# Patient Record
Sex: Male | Born: 1960
Health system: Southern US, Community
[De-identification: ages and names within clinical notes are randomized; demographics above are authoritative.]

## PROBLEM LIST (undated history)

## (undated) DIAGNOSIS — R635 Abnormal weight gain: Secondary | ICD-10-CM

## (undated) DIAGNOSIS — R413 Other amnesia: Secondary | ICD-10-CM

## (undated) DIAGNOSIS — F4323 Adjustment disorder with mixed anxiety and depressed mood: Secondary | ICD-10-CM

## (undated) DIAGNOSIS — G4700 Insomnia, unspecified: Secondary | ICD-10-CM

## (undated) DIAGNOSIS — R42 Dizziness and giddiness: Secondary | ICD-10-CM

## (undated) DIAGNOSIS — F4321 Adjustment disorder with depressed mood: Principal | ICD-10-CM

## (undated) DIAGNOSIS — B20 Human immunodeficiency virus [HIV] disease: Secondary | ICD-10-CM

## (undated) DIAGNOSIS — Z21 Asymptomatic human immunodeficiency virus [HIV] infection status: Secondary | ICD-10-CM

## (undated) DIAGNOSIS — R110 Nausea: Secondary | ICD-10-CM

## (undated) DIAGNOSIS — C61 Malignant neoplasm of prostate: Secondary | ICD-10-CM

## (undated) HISTORY — DX: Asymptomatic human immunodeficiency virus (hiv) infection status: Z21

## (undated) HISTORY — DX: Insomnia, unspecified: G47.00

## (undated) HISTORY — DX: Malignant neoplasm of prostate: C61

## (undated) HISTORY — DX: Nausea: R11.0

## (undated) HISTORY — DX: Adjustment disorder with mixed anxiety and depressed mood: F43.23

## (undated) HISTORY — DX: Abnormal weight gain: R63.5

## (undated) HISTORY — DX: Other amnesia: R41.3

## (undated) HISTORY — DX: Dizziness and giddiness: R42

## (undated) HISTORY — DX: Human immunodeficiency virus (HIV) disease: B20

## (undated) HISTORY — DX: Adjustment disorder with depressed mood: F43.21

---

## 2001-09-20 ENCOUNTER — Emergency Department (HOSPITAL_COMMUNITY): Admission: EM | Admit: 2001-09-20 | Discharge: 2001-09-20 | Payer: Self-pay | Admitting: Emergency Medicine

## 2001-09-24 ENCOUNTER — Encounter: Payer: Self-pay | Admitting: Emergency Medicine

## 2001-09-24 ENCOUNTER — Emergency Department (HOSPITAL_COMMUNITY): Admission: EM | Admit: 2001-09-24 | Discharge: 2001-09-24 | Payer: Self-pay | Admitting: Emergency Medicine

## 2001-09-26 ENCOUNTER — Encounter: Payer: Self-pay | Admitting: Internal Medicine

## 2001-09-26 ENCOUNTER — Inpatient Hospital Stay (HOSPITAL_COMMUNITY): Admission: EM | Admit: 2001-09-26 | Discharge: 2001-10-07 | Payer: Self-pay | Admitting: Emergency Medicine

## 2001-09-26 ENCOUNTER — Encounter: Payer: Self-pay | Admitting: Emergency Medicine

## 2001-09-26 ENCOUNTER — Encounter (INDEPENDENT_AMBULATORY_CARE_PROVIDER_SITE_OTHER): Payer: Self-pay | Admitting: Specialist

## 2001-09-27 ENCOUNTER — Encounter (INDEPENDENT_AMBULATORY_CARE_PROVIDER_SITE_OTHER): Payer: Self-pay | Admitting: *Deleted

## 2001-09-27 ENCOUNTER — Encounter: Payer: Self-pay | Admitting: Infectious Disease

## 2001-09-27 LAB — CONVERTED CEMR LAB: CD4 Count: 10 microliters

## 2001-09-28 ENCOUNTER — Encounter: Payer: Self-pay | Admitting: Internal Medicine

## 2001-10-22 ENCOUNTER — Encounter: Admission: RE | Admit: 2001-10-22 | Discharge: 2001-10-22 | Payer: Self-pay | Admitting: Infectious Diseases

## 2001-12-06 ENCOUNTER — Ambulatory Visit (HOSPITAL_COMMUNITY): Admission: RE | Admit: 2001-12-06 | Discharge: 2001-12-06 | Payer: Self-pay | Admitting: Infectious Diseases

## 2001-12-06 ENCOUNTER — Encounter: Admission: RE | Admit: 2001-12-06 | Discharge: 2001-12-06 | Payer: Self-pay | Admitting: Infectious Diseases

## 2002-01-03 ENCOUNTER — Encounter: Admission: RE | Admit: 2002-01-03 | Discharge: 2002-01-03 | Payer: Self-pay | Admitting: Infectious Diseases

## 2002-04-05 ENCOUNTER — Encounter: Admission: RE | Admit: 2002-04-05 | Discharge: 2002-04-05 | Payer: Self-pay | Admitting: Infectious Diseases

## 2002-04-05 ENCOUNTER — Ambulatory Visit (HOSPITAL_COMMUNITY): Admission: RE | Admit: 2002-04-05 | Discharge: 2002-04-05 | Payer: Self-pay | Admitting: Infectious Diseases

## 2002-05-04 ENCOUNTER — Encounter: Admission: RE | Admit: 2002-05-04 | Discharge: 2002-05-04 | Payer: Self-pay | Admitting: Infectious Diseases

## 2002-08-08 ENCOUNTER — Encounter: Admission: RE | Admit: 2002-08-08 | Discharge: 2002-08-08 | Payer: Self-pay | Admitting: Infectious Diseases

## 2002-08-08 ENCOUNTER — Encounter (INDEPENDENT_AMBULATORY_CARE_PROVIDER_SITE_OTHER): Payer: Self-pay | Admitting: Infectious Diseases

## 2002-08-22 ENCOUNTER — Encounter: Admission: RE | Admit: 2002-08-22 | Discharge: 2002-08-22 | Payer: Self-pay | Admitting: Infectious Diseases

## 2002-11-17 ENCOUNTER — Encounter (INDEPENDENT_AMBULATORY_CARE_PROVIDER_SITE_OTHER): Payer: Self-pay | Admitting: Infectious Diseases

## 2002-11-17 ENCOUNTER — Ambulatory Visit (HOSPITAL_COMMUNITY): Admission: RE | Admit: 2002-11-17 | Discharge: 2002-11-17 | Payer: Self-pay | Admitting: Infectious Diseases

## 2002-11-17 ENCOUNTER — Encounter: Admission: RE | Admit: 2002-11-17 | Discharge: 2002-11-17 | Payer: Self-pay | Admitting: Infectious Diseases

## 2002-12-05 ENCOUNTER — Encounter: Admission: RE | Admit: 2002-12-05 | Discharge: 2002-12-05 | Payer: Self-pay | Admitting: Infectious Diseases

## 2003-02-15 ENCOUNTER — Encounter: Admission: RE | Admit: 2003-02-15 | Discharge: 2003-02-15 | Payer: Self-pay | Admitting: Infectious Diseases

## 2003-02-15 ENCOUNTER — Encounter (INDEPENDENT_AMBULATORY_CARE_PROVIDER_SITE_OTHER): Payer: Self-pay | Admitting: Infectious Diseases

## 2003-02-15 ENCOUNTER — Ambulatory Visit (HOSPITAL_COMMUNITY): Admission: RE | Admit: 2003-02-15 | Discharge: 2003-02-15 | Payer: Self-pay | Admitting: Infectious Diseases

## 2003-03-20 ENCOUNTER — Encounter: Admission: RE | Admit: 2003-03-20 | Discharge: 2003-03-20 | Payer: Self-pay | Admitting: Infectious Diseases

## 2003-05-11 ENCOUNTER — Emergency Department (HOSPITAL_COMMUNITY): Admission: EM | Admit: 2003-05-11 | Discharge: 2003-05-11 | Payer: Self-pay | Admitting: Emergency Medicine

## 2003-06-26 ENCOUNTER — Ambulatory Visit (HOSPITAL_COMMUNITY): Admission: RE | Admit: 2003-06-26 | Discharge: 2003-06-26 | Payer: Self-pay | Admitting: Infectious Diseases

## 2003-06-26 ENCOUNTER — Encounter: Admission: RE | Admit: 2003-06-26 | Discharge: 2003-06-26 | Payer: Self-pay | Admitting: Infectious Diseases

## 2003-07-12 ENCOUNTER — Encounter: Admission: RE | Admit: 2003-07-12 | Discharge: 2003-07-12 | Payer: Self-pay | Admitting: Infectious Diseases

## 2003-11-20 ENCOUNTER — Ambulatory Visit (HOSPITAL_COMMUNITY): Admission: RE | Admit: 2003-11-20 | Discharge: 2003-11-20 | Payer: Self-pay | Admitting: Infectious Diseases

## 2003-11-20 ENCOUNTER — Ambulatory Visit: Payer: Self-pay | Admitting: Infectious Diseases

## 2003-12-04 ENCOUNTER — Ambulatory Visit: Payer: Self-pay | Admitting: Infectious Diseases

## 2004-02-05 ENCOUNTER — Ambulatory Visit (HOSPITAL_COMMUNITY): Admission: RE | Admit: 2004-02-05 | Discharge: 2004-02-05 | Payer: Self-pay | Admitting: Infectious Diseases

## 2004-02-05 ENCOUNTER — Ambulatory Visit: Payer: Self-pay | Admitting: Infectious Diseases

## 2004-03-11 ENCOUNTER — Ambulatory Visit: Payer: Self-pay | Admitting: Infectious Diseases

## 2004-06-04 ENCOUNTER — Ambulatory Visit: Payer: Self-pay | Admitting: Infectious Diseases

## 2004-06-04 ENCOUNTER — Ambulatory Visit (HOSPITAL_COMMUNITY): Admission: RE | Admit: 2004-06-04 | Discharge: 2004-06-04 | Payer: Self-pay | Admitting: Infectious Diseases

## 2004-06-24 ENCOUNTER — Ambulatory Visit: Payer: Self-pay | Admitting: Infectious Diseases

## 2004-11-07 ENCOUNTER — Ambulatory Visit: Payer: Self-pay | Admitting: Infectious Diseases

## 2004-11-07 ENCOUNTER — Ambulatory Visit (HOSPITAL_COMMUNITY): Admission: RE | Admit: 2004-11-07 | Discharge: 2004-11-07 | Payer: Self-pay | Admitting: Infectious Diseases

## 2004-11-25 ENCOUNTER — Ambulatory Visit: Payer: Self-pay | Admitting: Infectious Diseases

## 2005-02-03 ENCOUNTER — Ambulatory Visit: Payer: Self-pay | Admitting: Infectious Diseases

## 2005-02-03 ENCOUNTER — Ambulatory Visit (HOSPITAL_COMMUNITY): Admission: RE | Admit: 2005-02-03 | Discharge: 2005-02-03 | Payer: Self-pay | Admitting: Infectious Diseases

## 2005-02-17 ENCOUNTER — Ambulatory Visit: Payer: Self-pay | Admitting: Infectious Diseases

## 2005-05-19 ENCOUNTER — Encounter (INDEPENDENT_AMBULATORY_CARE_PROVIDER_SITE_OTHER): Payer: Self-pay | Admitting: *Deleted

## 2005-05-19 ENCOUNTER — Encounter: Admission: RE | Admit: 2005-05-19 | Discharge: 2005-05-19 | Payer: Self-pay | Admitting: Infectious Diseases

## 2005-05-19 ENCOUNTER — Ambulatory Visit: Payer: Self-pay | Admitting: Infectious Diseases

## 2005-05-19 LAB — CONVERTED CEMR LAB
CD4 Count: 210 microliters
HIV 1 RNA Quant: 49 copies/mL

## 2005-06-09 ENCOUNTER — Ambulatory Visit: Payer: Self-pay | Admitting: Infectious Diseases

## 2005-10-20 ENCOUNTER — Encounter (INDEPENDENT_AMBULATORY_CARE_PROVIDER_SITE_OTHER): Payer: Self-pay | Admitting: *Deleted

## 2005-10-20 ENCOUNTER — Ambulatory Visit: Payer: Self-pay | Admitting: Infectious Diseases

## 2005-10-20 ENCOUNTER — Encounter: Admission: RE | Admit: 2005-10-20 | Discharge: 2005-10-20 | Payer: Self-pay | Admitting: Infectious Diseases

## 2005-10-20 LAB — CONVERTED CEMR LAB
CD4 Count: 220 microliters
HIV 1 RNA Quant: 66 copies/mL

## 2005-11-17 ENCOUNTER — Ambulatory Visit: Payer: Self-pay | Admitting: Infectious Diseases

## 2006-03-16 ENCOUNTER — Encounter (INDEPENDENT_AMBULATORY_CARE_PROVIDER_SITE_OTHER): Payer: Self-pay | Admitting: *Deleted

## 2006-03-16 ENCOUNTER — Ambulatory Visit: Payer: Self-pay | Admitting: Infectious Diseases

## 2006-03-16 ENCOUNTER — Encounter: Admission: RE | Admit: 2006-03-16 | Discharge: 2006-03-16 | Payer: Self-pay | Admitting: Infectious Diseases

## 2006-03-16 LAB — CONVERTED CEMR LAB
ALT: 26 units/L (ref 0–53)
AST: 17 units/L (ref 0–37)
Albumin: 4.5 g/dL (ref 3.5–5.2)
Alkaline Phosphatase: 93 units/L (ref 39–117)
BUN: 10 mg/dL (ref 6–23)
Basophils Absolute: 0 10*3/uL (ref 0.0–0.1)
Basophils Relative: 1 % (ref 0–1)
Bilirubin Urine: NEGATIVE
CD4 Count: 250 microliters
CO2: 29 meq/L (ref 19–32)
Calcium: 9 mg/dL (ref 8.4–10.5)
Chloride: 106 meq/L (ref 96–112)
Cholesterol: 203 mg/dL — ABNORMAL HIGH (ref 0–200)
Creatinine, Ser: 0.86 mg/dL (ref 0.40–1.50)
Eosinophils Relative: 2 % (ref 0–5)
Glucose, Bld: 120 mg/dL — ABNORMAL HIGH (ref 70–99)
HCT: 44 % (ref 39.0–52.0)
HDL: 50 mg/dL (ref >39)
HIV 1 RNA Quant: 63 copies/mL — ABNORMAL HIGH (ref <50)
HIV-1 RNA Quant, Log: 1.8 — ABNORMAL HIGH (ref <1.70)
Hemoglobin, Urine: NEGATIVE
Hemoglobin: 14.4 g/dL (ref 13.0–17.0)
Ketones, ur: NEGATIVE mg/dL
LDL Cholesterol: 141 mg/dL — ABNORMAL HIGH (ref 0–99)
Leukocytes, UA: NEGATIVE
Lymphocytes Relative: 39 % (ref 12–46)
Lymphs Abs: 1.3 10*3/uL (ref 0.7–3.3)
MCHC: 32.7 g/dL (ref 30.0–36.0)
MCV: 92.8 fL (ref 78.0–100.0)
Monocytes Absolute: 0.5 10*3/uL (ref 0.2–0.7)
Monocytes Relative: 14 % — ABNORMAL HIGH (ref 3–11)
Neutro Abs: 1.5 10*3/uL — ABNORMAL LOW (ref 1.7–7.7)
Neutrophils Relative %: 45 % (ref 43–77)
Nitrite: NEGATIVE
Platelets: 172 10*3/uL (ref 150–400)
Potassium: 3.8 meq/L (ref 3.5–5.3)
Protein, ur: NEGATIVE mg/dL
RBC: 4.74 M/uL (ref 4.22–5.81)
RDW: 13.2 % (ref 11.5–14.0)
Sodium: 141 meq/L (ref 135–145)
Specific Gravity, Urine: 1.029 (ref 1.005–1.03)
Total Bilirubin: 0.6 mg/dL (ref 0.3–1.2)
Total CHOL/HDL Ratio: 4.1
Total Protein: 7.3 g/dL (ref 6.0–8.3)
Triglycerides: 60 mg/dL (ref <150)
Urine Glucose: NEGATIVE mg/dL
Urobilinogen, UA: 0.2 (ref 0.0–1.0)
VLDL: 12 mg/dL (ref 0–40)
WBC: 3.3 10*3/uL — ABNORMAL LOW (ref 4.0–10.5)
pH: 6 (ref 5.0–8.0)

## 2006-03-30 ENCOUNTER — Ambulatory Visit: Payer: Self-pay | Admitting: Infectious Diseases

## 2006-04-14 DIAGNOSIS — B009 Herpesviral infection, unspecified: Secondary | ICD-10-CM | POA: Insufficient documentation

## 2006-04-14 DIAGNOSIS — B20 Human immunodeficiency virus [HIV] disease: Secondary | ICD-10-CM

## 2006-04-14 DIAGNOSIS — B59 Pneumocystosis: Secondary | ICD-10-CM | POA: Insufficient documentation

## 2006-04-20 ENCOUNTER — Encounter (INDEPENDENT_AMBULATORY_CARE_PROVIDER_SITE_OTHER): Payer: Self-pay | Admitting: Infectious Diseases

## 2006-04-27 ENCOUNTER — Encounter (INDEPENDENT_AMBULATORY_CARE_PROVIDER_SITE_OTHER): Payer: Self-pay | Admitting: *Deleted

## 2006-04-27 LAB — CONVERTED CEMR LAB

## 2006-05-10 ENCOUNTER — Encounter (INDEPENDENT_AMBULATORY_CARE_PROVIDER_SITE_OTHER): Payer: Self-pay | Admitting: *Deleted

## 2006-08-06 ENCOUNTER — Encounter: Admission: RE | Admit: 2006-08-06 | Discharge: 2006-08-06 | Payer: Self-pay | Admitting: Infectious Diseases

## 2006-08-06 ENCOUNTER — Ambulatory Visit: Payer: Self-pay | Admitting: Infectious Diseases

## 2006-08-24 ENCOUNTER — Encounter (INDEPENDENT_AMBULATORY_CARE_PROVIDER_SITE_OTHER): Payer: Self-pay | Admitting: *Deleted

## 2006-08-31 ENCOUNTER — Ambulatory Visit: Payer: Self-pay | Admitting: Infectious Diseases

## 2006-11-09 ENCOUNTER — Encounter: Payer: Self-pay | Admitting: Infectious Disease

## 2006-12-08 ENCOUNTER — Encounter: Admission: RE | Admit: 2006-12-08 | Discharge: 2006-12-08 | Payer: Self-pay | Admitting: Infectious Disease

## 2006-12-08 ENCOUNTER — Ambulatory Visit: Payer: Self-pay | Admitting: Infectious Disease

## 2006-12-08 LAB — CONVERTED CEMR LAB
Albumin: 4.2 g/dL (ref 3.5–5.2)
BUN: 9 mg/dL (ref 6–23)
CO2: 28 meq/L (ref 19–32)
Calcium: 8.8 mg/dL (ref 8.4–10.5)
Glucose, Bld: 113 mg/dL — ABNORMAL HIGH (ref 70–99)
HCT: 44.8 % (ref 39.0–52.0)
HIV 1 RNA Quant: <50 copies/mL (ref <50)
HIV-1 RNA Quant, Log: <1.7 (ref <1.70)
Lymphocytes Relative: 36 % (ref 12–46)
Lymphs Abs: 1.5 10*3/uL (ref 0.7–3.3)
Monocytes Relative: 14 % — ABNORMAL HIGH (ref 3–11)
Neutrophils Relative %: 47 % (ref 43–77)
Platelets: 212 10*3/uL (ref 150–400)
Potassium: 3.9 meq/L (ref 3.5–5.3)
RBC: 4.73 M/uL (ref 4.22–5.81)
Sodium: 140 meq/L (ref 135–145)
Total Protein: 6.9 g/dL (ref 6.0–8.3)
WBC: 4 10*3/uL (ref 4.0–10.5)

## 2006-12-31 ENCOUNTER — Ambulatory Visit (HOSPITAL_COMMUNITY): Admission: RE | Admit: 2006-12-31 | Discharge: 2006-12-31 | Payer: Self-pay | Admitting: Infectious Disease

## 2006-12-31 ENCOUNTER — Ambulatory Visit: Payer: Self-pay | Admitting: Infectious Disease

## 2006-12-31 ENCOUNTER — Encounter (INDEPENDENT_AMBULATORY_CARE_PROVIDER_SITE_OTHER): Payer: Self-pay | Admitting: *Deleted

## 2006-12-31 DIAGNOSIS — F3289 Other specified depressive episodes: Secondary | ICD-10-CM | POA: Insufficient documentation

## 2006-12-31 DIAGNOSIS — F329 Major depressive disorder, single episode, unspecified: Secondary | ICD-10-CM

## 2006-12-31 DIAGNOSIS — R0789 Other chest pain: Secondary | ICD-10-CM | POA: Insufficient documentation

## 2007-01-04 ENCOUNTER — Encounter: Payer: Self-pay | Admitting: Infectious Disease

## 2007-01-05 ENCOUNTER — Telehealth: Payer: Self-pay | Admitting: Infectious Disease

## 2007-03-17 ENCOUNTER — Encounter: Admission: RE | Admit: 2007-03-17 | Discharge: 2007-03-17 | Payer: Self-pay | Admitting: Infectious Disease

## 2007-03-17 ENCOUNTER — Ambulatory Visit: Payer: Self-pay | Admitting: Infectious Disease

## 2007-03-17 LAB — CONVERTED CEMR LAB
ALT: 21 units/L (ref 0–53)
Albumin: 4.6 g/dL (ref 3.5–5.2)
Basophils Absolute: 0 10*3/uL (ref 0.0–0.1)
CO2: 27 meq/L (ref 19–32)
Chloride: 104 meq/L (ref 96–112)
Cholesterol: 210 mg/dL — ABNORMAL HIGH (ref 0–200)
Eosinophils Relative: 2 % (ref 0–5)
HIV 1 RNA Quant: 120 copies/mL — ABNORMAL HIGH (ref <50)
Lymphocytes Relative: 34 % (ref 12–46)
Neutro Abs: 1.8 10*3/uL (ref 1.7–7.7)
Neutrophils Relative %: 52 % (ref 43–77)
Platelets: 164 10*3/uL (ref 150–400)
Potassium: 4 meq/L (ref 3.5–5.3)
RDW: 13.2 % (ref 11.5–15.5)
Sodium: 142 meq/L (ref 135–145)
Total Bilirubin: 0.6 mg/dL (ref 0.3–1.2)
Total Protein: 7.7 g/dL (ref 6.0–8.3)
VLDL: 17 mg/dL (ref 0–40)
WBC: 3.6 10*3/uL — ABNORMAL LOW (ref 4.0–10.5)

## 2007-04-05 ENCOUNTER — Ambulatory Visit: Payer: Self-pay | Admitting: Infectious Disease

## 2007-07-16 ENCOUNTER — Ambulatory Visit: Payer: Self-pay | Admitting: Infectious Disease

## 2007-07-16 ENCOUNTER — Encounter: Admission: RE | Admit: 2007-07-16 | Discharge: 2007-07-16 | Payer: Self-pay | Admitting: Infectious Disease

## 2007-07-16 LAB — CONVERTED CEMR LAB
ALT: 20 units/L (ref 0–53)
AST: 23 units/L (ref 0–37)
Albumin: 4.4 g/dL (ref 3.5–5.2)
Basophils Absolute: 0 10*3/uL (ref 0.0–0.1)
Basophils Relative: 1 % (ref 0–1)
Calcium: 8.8 mg/dL (ref 8.4–10.5)
Chloride: 104 meq/L (ref 96–112)
HIV 1 RNA Quant: <50 copies/mL (ref <50)
HIV-1 RNA Quant, Log: <1.7 (ref <1.70)
MCHC: 33.3 g/dL (ref 30.0–36.0)
Monocytes Relative: 13 % — ABNORMAL HIGH (ref 3–12)
Neutro Abs: 1.6 10*3/uL — ABNORMAL LOW (ref 1.7–7.7)
Neutrophils Relative %: 46 % (ref 43–77)
Potassium: 3.9 meq/L (ref 3.5–5.3)
RBC: 4.8 M/uL (ref 4.22–5.81)
RDW: 13.3 % (ref 11.5–15.5)

## 2007-08-24 ENCOUNTER — Ambulatory Visit: Payer: Self-pay | Admitting: Infectious Disease

## 2007-08-24 DIAGNOSIS — F528 Other sexual dysfunction not due to a substance or known physiological condition: Secondary | ICD-10-CM

## 2007-08-24 DIAGNOSIS — R7309 Other abnormal glucose: Secondary | ICD-10-CM

## 2007-08-24 DIAGNOSIS — E785 Hyperlipidemia, unspecified: Secondary | ICD-10-CM

## 2007-08-24 LAB — CONVERTED CEMR LAB
GC Probe Amp, Urine: NEGATIVE
Hep A Total Ab: NEGATIVE

## 2007-10-15 ENCOUNTER — Encounter: Payer: Self-pay | Admitting: Infectious Disease

## 2007-10-27 ENCOUNTER — Ambulatory Visit: Payer: Self-pay | Admitting: Cardiology

## 2008-01-03 ENCOUNTER — Ambulatory Visit: Payer: Self-pay | Admitting: Infectious Disease

## 2008-01-03 LAB — CONVERTED CEMR LAB
ALT: 20 units/L (ref 0–53)
AST: 16 units/L (ref 0–37)
Basophils Absolute: 0 10*3/uL (ref 0.0–0.1)
Basophils Relative: 1 % (ref 0–1)
CO2: 26 meq/L (ref 19–32)
Cholesterol: 189 mg/dL (ref 0–200)
Eosinophils Relative: 3 % (ref 0–5)
HCT: 40.4 % (ref 39.0–52.0)
HIV 1 RNA Quant: <48 copies/mL (ref <50)
HIV-1 RNA Quant, Log: <1.68 (ref <1.70)
LDL Cholesterol: 113 mg/dL — ABNORMAL HIGH (ref 0–99)
Lymphocytes Relative: 34 % (ref 12–46)
Neutro Abs: 2 10*3/uL (ref 1.7–7.7)
Platelets: 194 10*3/uL (ref 150–400)
RDW: 13.3 % (ref 11.5–15.5)
Sodium: 142 meq/L (ref 135–145)
Total Bilirubin: 0.3 mg/dL (ref 0.3–1.2)
Total Protein: 6.8 g/dL (ref 6.0–8.3)
VLDL: 28 mg/dL (ref 0–40)

## 2008-01-06 ENCOUNTER — Emergency Department (HOSPITAL_COMMUNITY): Admission: EM | Admit: 2008-01-06 | Discharge: 2008-01-06 | Payer: Self-pay | Admitting: Emergency Medicine

## 2008-01-24 ENCOUNTER — Ambulatory Visit: Payer: Self-pay | Admitting: Infectious Disease

## 2008-01-24 DIAGNOSIS — R21 Rash and other nonspecific skin eruption: Secondary | ICD-10-CM

## 2008-01-24 LAB — CONVERTED CEMR LAB: Hgb A1c MFr Bld: 5.2 %

## 2008-06-09 ENCOUNTER — Ambulatory Visit: Payer: Self-pay | Admitting: Infectious Disease

## 2008-06-09 LAB — CONVERTED CEMR LAB
ALT: 20 units/L (ref 0–53)
AST: 21 units/L (ref 0–37)
Albumin: 4.6 g/dL (ref 3.5–5.2)
BUN: 13 mg/dL (ref 6–23)
Basophils Absolute: 0 10*3/uL (ref 0.0–0.1)
Basophils Relative: 0 % (ref 0–1)
CO2: 29 meq/L (ref 19–32)
Calcium: 9.5 mg/dL (ref 8.4–10.5)
Chloride: 105 meq/L (ref 96–112)
Cholesterol: 185 mg/dL (ref 0–200)
Creatinine, Ser: 0.86 mg/dL (ref 0.40–1.50)
Eosinophils Absolute: 0.1 10*3/uL (ref 0.0–0.7)
GFR calc Af Amer: >60 mL/min (ref >60)
HIV 1 RNA Quant: 110 copies/mL — ABNORMAL HIGH (ref <48)
HIV-1 RNA Quant, Log: 2.04 — ABNORMAL HIGH (ref <1.68)
MCHC: 32.7 g/dL (ref 30.0–36.0)
MCV: 93.5 fL (ref 78.0–100.0)
Neutro Abs: 3.8 10*3/uL (ref 1.7–7.7)
Neutrophils Relative %: 61 % (ref 43–77)
Platelets: 212 10*3/uL (ref 150–400)
Potassium: 4.3 meq/L (ref 3.5–5.3)
RBC: 4.91 M/uL (ref 4.22–5.81)
WBC: 6.2 10*3/uL (ref 4.0–10.5)

## 2008-06-22 ENCOUNTER — Ambulatory Visit: Payer: Self-pay | Admitting: Infectious Disease

## 2008-06-22 LAB — CONVERTED CEMR LAB
Cholesterol, target level: 200 mg/dL
HDL goal, serum: 40 mg/dL
LDL Goal: 130 mg/dL

## 2008-06-23 ENCOUNTER — Encounter: Payer: Self-pay | Admitting: Infectious Disease

## 2008-10-16 ENCOUNTER — Ambulatory Visit: Payer: Self-pay | Admitting: Infectious Disease

## 2008-10-16 LAB — CONVERTED CEMR LAB
ALT: 20 units/L (ref 0–53)
AST: 18 units/L (ref 0–37)
Alkaline Phosphatase: 84 units/L (ref 39–117)
Basophils Relative: 0 % (ref 0–1)
CO2: 24 meq/L (ref 19–32)
Creatinine, Ser: 0.99 mg/dL (ref 0.40–1.50)
Eosinophils Absolute: 0.1 10*3/uL (ref 0.0–0.7)
Eosinophils Relative: 2 % (ref 0–5)
HCT: 47.6 % (ref 39.0–52.0)
Lymphs Abs: 2 10*3/uL (ref 0.7–4.0)
MCHC: 33 g/dL (ref 30.0–36.0)
MCV: 94.3 fL (ref >=78.0)
Platelets: 211 10*3/uL (ref 150–400)
Sodium: 141 meq/L (ref 135–145)
Total Bilirubin: 0.7 mg/dL (ref 0.3–1.2)
WBC: 5.3 10*3/uL (ref 4.0–10.5)

## 2008-10-30 ENCOUNTER — Ambulatory Visit: Payer: Self-pay | Admitting: Infectious Disease

## 2008-10-30 DIAGNOSIS — G4700 Insomnia, unspecified: Secondary | ICD-10-CM | POA: Insufficient documentation

## 2009-04-09 ENCOUNTER — Ambulatory Visit: Payer: Self-pay | Admitting: Infectious Disease

## 2009-04-09 LAB — CONVERTED CEMR LAB
Albumin: 4.1 g/dL (ref 3.5–5.2)
BUN: 11 mg/dL (ref 6–23)
Basophils Relative: 1 % (ref 0–1)
CO2: 28 meq/L (ref 19–32)
Cholesterol: 198 mg/dL (ref 0–200)
Eosinophils Absolute: 0.1 10*3/uL (ref 0.0–0.7)
Eosinophils Relative: 2 % (ref 0–5)
Glucose, Bld: 94 mg/dL (ref 70–99)
HCT: 43.1 % (ref 39.0–52.0)
HIV 1 RNA Quant: 52 copies/mL — ABNORMAL HIGH (ref <48)
Hemoglobin: 14.3 g/dL (ref 13.0–17.0)
MCHC: 33.2 g/dL (ref 30.0–36.0)
MCV: 93.1 fL (ref >=78.0)
Monocytes Absolute: 0.6 10*3/uL (ref 0.1–1.0)
Monocytes Relative: 14 % — ABNORMAL HIGH (ref 3–12)
Potassium: 4 meq/L (ref 3.5–5.3)
RBC: 4.63 M/uL (ref 4.22–5.81)
Sodium: 142 meq/L (ref 135–145)
Total Bilirubin: 0.6 mg/dL (ref 0.3–1.2)
Total Protein: 6.9 g/dL (ref 6.0–8.3)
Triglycerides: 101 mg/dL (ref <150)

## 2009-04-26 ENCOUNTER — Ambulatory Visit: Payer: Self-pay | Admitting: Infectious Disease

## 2009-04-26 DIAGNOSIS — F0630 Mood disorder due to known physiological condition, unspecified: Secondary | ICD-10-CM

## 2009-07-06 ENCOUNTER — Ambulatory Visit: Payer: Self-pay | Admitting: Infectious Disease

## 2009-07-06 LAB — CONVERTED CEMR LAB
Albumin: 4.1 g/dL (ref 3.5–5.2)
Basophils Relative: 0 % (ref 0–1)
CO2: 28 meq/L (ref 19–32)
Calcium: 8.7 mg/dL (ref 8.4–10.5)
Chlamydia, Swab/Urine, PCR: NEGATIVE
GC Probe Amp, Urine: NEGATIVE
Glucose, Bld: 104 mg/dL — ABNORMAL HIGH (ref 70–99)
Hemoglobin: 14.1 g/dL (ref 13.0–17.0)
MCHC: 33.2 g/dL (ref 30.0–36.0)
MCV: 93 fL (ref 78.0–100.0)
Monocytes Absolute: 0.9 10*3/uL (ref 0.1–1.0)
Monocytes Relative: 16 % — ABNORMAL HIGH (ref 3–12)
Neutro Abs: 2.5 10*3/uL (ref 1.7–7.7)
RBC: 4.57 M/uL (ref 4.22–5.81)
Sodium: 142 meq/L (ref 135–145)
Total Bilirubin: 0.3 mg/dL (ref 0.3–1.2)
Total Protein: 6.8 g/dL (ref 6.0–8.3)

## 2009-11-14 ENCOUNTER — Ambulatory Visit: Payer: Self-pay | Admitting: Infectious Disease

## 2009-11-14 LAB — CONVERTED CEMR LAB
AST: 17 units/L (ref 0–37)
Albumin: 4.3 g/dL (ref 3.5–5.2)
Alkaline Phosphatase: 75 units/L (ref 39–117)
Basophils Absolute: 0 10*3/uL (ref 0.0–0.1)
Basophils Relative: 1 % (ref 0–1)
Eosinophils Absolute: 0.1 10*3/uL (ref 0.0–0.7)
HDL: 50 mg/dL (ref >39)
HIV-1 RNA Quant, Log: <1.3 (ref <1.30)
LDL Cholesterol: 99 mg/dL (ref 0–99)
MCHC: 32.7 g/dL (ref 30.0–36.0)
MCV: 96.7 fL (ref 78.0–100.0)
Neutrophils Relative %: 47 % (ref 43–77)
Platelets: 214 10*3/uL (ref 150–400)
Potassium: 4 meq/L (ref 3.5–5.3)
RDW: 13.3 % (ref 11.5–15.5)
Sodium: 140 meq/L (ref 135–145)
Testosterone: 343.48 ng/dL — ABNORMAL LOW (ref 350–890)
Total Bilirubin: 0.2 mg/dL — ABNORMAL LOW (ref 0.3–1.2)
Total Protein: 7.2 g/dL (ref 6.0–8.3)
VLDL: 43 mg/dL — ABNORMAL HIGH (ref 0–40)
WBC: 5.7 10*3/uL (ref 4.0–10.5)

## 2009-11-23 ENCOUNTER — Encounter: Payer: Self-pay | Admitting: Internal Medicine

## 2009-11-23 ENCOUNTER — Ambulatory Visit: Payer: Self-pay | Admitting: Internal Medicine

## 2009-11-23 ENCOUNTER — Observation Stay (HOSPITAL_COMMUNITY): Admission: EM | Admit: 2009-11-23 | Discharge: 2009-11-24 | Payer: Self-pay | Admitting: Emergency Medicine

## 2009-11-29 ENCOUNTER — Encounter: Payer: Self-pay | Admitting: Infectious Disease

## 2010-01-28 ENCOUNTER — Encounter (INDEPENDENT_AMBULATORY_CARE_PROVIDER_SITE_OTHER): Payer: Self-pay | Admitting: *Deleted

## 2010-02-06 ENCOUNTER — Ambulatory Visit: Payer: Self-pay | Admitting: Infectious Disease

## 2010-03-31 LAB — CONVERTED CEMR LAB
ALT: 32 units/L (ref 0–53)
AST: 23 units/L (ref 0–37)
Albumin: 4.6 g/dL (ref 3.5–5.2)
Alkaline Phosphatase: 80 units/L (ref 39–117)
BUN: 14 mg/dL (ref 6–23)
Basophils Absolute: 0 10*3/uL (ref 0.0–0.1)
Basophils Relative: 1 % (ref 0–1)
CD4 Count: 240 microliters
Calcium: 9.3 mg/dL (ref 8.4–10.5)
Chloride: 104 meq/L (ref 96–112)
Eosinophils Absolute: 0.1 10*3/uL (ref 0.0–0.7)
MCHC: 32.8 g/dL (ref 30.0–36.0)
MCV: 94.9 fL (ref 78.0–100.0)
Monocytes Relative: 14 % — ABNORMAL HIGH (ref 3–11)
Neutro Abs: 1.5 10*3/uL — ABNORMAL LOW (ref 1.7–7.7)
Neutrophils Relative %: 43 % (ref 43–77)
Platelets: 184 10*3/uL (ref 150–400)
Potassium: 4.4 meq/L (ref 3.5–5.3)
RBC: 4.95 M/uL (ref 4.22–5.81)
RDW: 13.9 % (ref 11.5–14.0)
Sodium: 142 meq/L (ref 135–145)

## 2010-04-04 NOTE — Miscellaneous (Signed)
  Clinical Lists Changes 

## 2010-04-04 NOTE — Initial Assessments (Signed)
INTERNAL MEDICINE ADMISSION HISTORY AND PHYSICAL  Attending: Dr. Doneen Poisson  First contact: Dr. Claudette Laws [phone] Second contact: Dr. Eben Burow [phone] [facility], after-hours: [phone], [phone])   PCP: Dr. Daiva Eves  CC: Chesp pain  HPI: Patient is a 50 year old male with PMH of HIV and HLD who came to ED for chest pain. The history was provided by the patient. The chest pain started suddenly early this morning abround 4AM and woke him up. It is constant and tight feeling, located in the substernal region, radiation to left arm with numbness. His pain was 8 out of 10 at its worst, wosened by exertion, now 3/10. He used to have chest pain every 6 months which he thought is due to anxiety, and drinking cold water could make it go away. but this time he tried this again, but no relieve. He denies SOB, fever,  abdominal pain, chills, cough, diaphoresis, dizziness, hemoptysis, nausea or vomiting. No diarrhea or dysuria. He is a current smoker and drinker, denies drug abuse.  He was not given any meds in ED.  ALLERGIES: ! * EXCEDRIN ! * ALKA SELTZER   PAST MEDICAL HISTORY: HIV disease: CD4: 520, VL: <20. Dx in 1994 Pneumocystis carinii pneumonia about 8 yrs ago. Herpes Simplex Virus II Elevated blood sugar: A1C 5.2 in 01/2008 Hyperlipidemia: LDL 99, HDL50, CH 192, TG215 Rash on arms and penis: now gone High risk sexual behavior GERD   MEDICATIONS: ATRIPLA 600-200-300 MG TABS (EFAVIRENZ-EMTRICITAB-TENOFOVIR) Take 1 tablet by mouth once a day   SOCIAL HISTORY: Single, lives by himself in [place] and has a girlfriend Alcohol use: 6 beers per month Drug use-no Occupation: Disability, used to be musician Current Smoker: 3-4 cigs per day for 8 yrs   FAMILY HISTORY No early CAD. Mother with Alzheimers, DM and HTN and died at age of 46s. One brother and 2 sisters have DM.  ROS: See HPI  VITALS: T:  98 P: 101 BP: 134/90 R: 20 O2SAT: 98% ON:RA  PHYSICAL EXAM: GEN: NAD HEENT:  PERRL, EOMI, no icterus, no pallor NECK: supple, no JVD, no cervical LN LUNGS: clear to auscultation bilaterally, no wheezing/crackles.  CVS: RRR, no murmur/rubs/gallops ABD: Soft, non-tender/distension, normal bowel sounds. EXTREMITIES: No edema or cyanosis NEURO: Alert &Oriented x3, no focal motor or sensory deficits.     LABS: CBC+Diff  WBC                                      6.5               4.0-10.5         K/uL  RBC                                      4.82              4.22-5.81        MIL/uL  Hemoglobin (HGB)                         15.3              13.0-17.0        g/dL  Hematocrit (HCT)  43.5              39.0-52.0        %  MCV                                      90.2              78.0-100.0       fL  MCH -                                    31.7              26.0-34.0        pg  MCHC                                     35.2              30.0-36.0        g/dL  RDW                                      12.8              11.5-15.5        %  Platelet Count (PLT)                     196               150-400          K/uL  Neutrophils, %                           43                43-77            %  Lymphocytes, %                           44                12-46            %  Monocytes, %                             11                3-12             %  Eosinophils, %                           2                 0-5              %  Basophils, %                             0  0-1              %  Neutrophils, Absolute                    2.8               1.7-7.7          K/uL  Lymphocytes, Absolute                    2.9               0.7-4.0          K/uL  Monocytes, Absolute                      0.7               0.1-1.0          K/uL  Eosinophils, Absolute                    0.1               0.0-0.7          K/uL  Basophils, Absolute                      0.0               0.0-0.1          K/uL  Chem 8 I-Stat  TCO2                                      25                0-100            mmol/L  Ionized Calcium                          1.06       l      1.12-1.32        mmol/L  Hemoglobin (HGB)                         16.3              13.0-17.0        g/dL  Hematocrit (HCT)                         48.0              39.0-52.0        %  Sodium (NA)                              143               135-145          mEq/L  Potassium (K)                            3.6               3.5-5.1          mEq/L  Chloride  109               96-112           mEq/L  Glucose                                  116        h      70-99            mg/dL  BUN                                      19                6-23             mg/dL  Creatinine                               1.2               0.4-1.5          mg/dL  CKMB, POC                                <1.0       l      1.0-8.0          ng/mL  Troponin I, POC                          <0.05             0.00-0.09        ng/mL  Myoglobin, POC                           44.4              12-200           ng/mL   Protime ( Prothrombin Time)              12.7              11.6-15.2        seconds  INR                                      0.93              0.00-1.49  CHEST - 2 VIEW    Comparison: Chest radiograph performed 12/31/2006    Findings: The lungs are well-aerated and clear.  There is no   evidence of focal opacification, pleural effusion or pneumothorax.   Mild bibasilar density is thought to reflect overlying soft   tissues.    The heart is normal in size; the mediastinal contour is within   normal limits.  No acute osseous abnormalities are seen.    IMPRESSION:   No acute cardiopulmonary process seen.  ASSESSMENT AND PLAN:  1. Chest pain: His chest pain is likely due to GI cause such as GERD, gastritis or esophagitis, and anxiety can also contribute to this. But he has risk factors such as  smoking, HLD, ACS needs to be R/O. Other causes such PE, PNA  or dissecting aorta is less likely.         --Admit to telemetry bed for observation      --EKG, cycle cardiac enzymes x 3      --2D-Echo to rule out wall motion abnormality      --Start aspirin, Nitroglycerine, morphine for pain      --Check TSH and A1C      --UDS      -- Protonix  2. HIV: Well controlled with Atripla with CD4: 520, and VL: <20. Will continue this.  3. GERD: Will start proptonix.  4. Tobacco abuse: Provided smoking cessation and nicotine.  5. DEPRESSION/ANXIETY: No SI/HI, due to life stress. Will give xanax as needed.  6. VTE PROPH: lovenox

## 2010-04-04 NOTE — Discharge Summary (Signed)
Summary: Hospital Discharge Update    Hospital Discharge Update:  Date of Admission: 11/23/2009 Date of Discharge: 11/24/2009  Brief Summary:  Pt is a 50 y/o M with PMH of HIV under good control who was admitted for chest pain rule-out. CE neg x3, EKG wnl. This is non-cardiac chest pain, likely 2/2 GI disorder such as esophageal spasm or GERD - could consider Barium swallow or EGD as outpatient.  Pt has h/o multiple admissions for this in the past, however given his gender and age, he should have outpatient stress test/ECHO.  Pt was sent home with prescription of esomeprazole to try until f/u appt.  Pt is to follow-up with Dr. Daiva Eves on Tuesday, Oct 11 at 3:30pm.   Other labs needed at follow-up: ECHO stress test ?barium swallow or EGD  Problem list changes:  Changed problem from CHEST TIGHTNESS (ICD-786.59) to CHEST PAIN, NON-CARDIAC (TDD-220.25)  Medication list changes:  Added new medication of ASPIRIN 81 MG TABS (ASPIRIN) Take 1 tablet by mouth once a day - Signed Added new medication of NEXIUM 20 MG CPDR (ESOMEPRAZOLE MAGNESIUM) Take 1 tablet by mouth once a day - Signed Rx of NEXIUM 20 MG CPDR (ESOMEPRAZOLE MAGNESIUM) Take 1 tablet by mouth once a day;  #30 x 1;  Signed;  Entered by: Danelle Berry, MD;  Authorized by: Danelle Berry, MD;  Method used: Handwritten Rx of ASPIRIN 81 MG TABS (ASPIRIN) Take 1 tablet by mouth once a day;  #30 x 5;  Signed;  Entered by: Danelle Berry, MD;  Authorized by: Danelle Berry, MD;  Method used: Handwritten  The medication, problem, and allergy lists have been updated.  Please see the dictated discharge summary for details.  Discharge medications:  ATRIPLA 600-200-300 MG TABS (EFAVIRENZ-EMTRICITAB-TENOFOVIR) Take 1 tablet by mouth once a day ASPIRIN 81 MG TABS (ASPIRIN) Take 1 tablet by mouth once a day NEXIUM 20 MG CPDR (ESOMEPRAZOLE MAGNESIUM) Take 1 tablet by mouth once a day  Other patient instructions:  Please follow-up with Dr. Daiva Eves on Tuesday, Oct 11 at 3:30pm at the infectious disease clinic.  (phone [phone]) Please take your medicines as directed. You may resume your normal diet and exercise. You may try using heat or ice pads to relieve the chest discomfort you feel.  Note: Hospital Discharge Medications & Other Instructions handout was printed, one copy for patient and a second copy to be placed in hospital chart.

## 2010-04-04 NOTE — Assessment & Plan Note (Signed)
Summary: CHECKUP/SB.   Visit Type:  Follow-up Primary Provider:  Paulette Blanch Dam MD  CC:  follow-up visit and Lipid Management.  History of Present Illness: 50 year old African American male with HIV suppresed to 52 copies on Atripla cd4 in 400s. He returns to clinic for followup. 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 Dean Carlson is concerned that his girlfriend may be cheating on him and even at times is concerned that she might try to "get rid of him" the way she had her husband, even insinuating that she might potentially poision him.  He endorsed passive suicidal ideation approximately a month ago but has had none since then. He says he gets strength from God and from his family, most of all his brother and his music  Lipid Management History:      Positive NCEP/ATP III risk factors include male age 30 years old or older and current tobacco user.  Negative NCEP/ATP III risk factors include no family history for ischemic heart disease, non-hypertensive, no ASHD (atherosclerotic heart disease), no prior stroke/TIA, no peripheral vascular disease, and no history of aortic aneurysm.    Problems Prior to Update: 1)  Insomnia Unspecified  (ICD-780.52) 2)  Sexual Activity, High Risk  (ICD-V69.2) 3)  Encounter For Long-term Use of Other Medications  (ICD-V58.69) 4)  Skin Rash  (ICD-782.1) 5)  Preventive Health Care  (ICD-V70.0) 6)  Hyperlipidemia  (ICD-272.4) 7)  Erectile Dysfunction  (ICD-302.72) 8)  Hyperglycemia  (ICD-790.29) 9)  Disorder, Depressive Nec  (ICD-311) 10)  Chest Tightness  (ICD-786.59) 11)  HIV Disease  (ICD-042) 12)  Pneumocystis Pneumonia  (ICD-136.3) 13)  Hsv  (ICD-054.9)  Medications Prior to Update: 1)  Atripla 600-200-300 Mg Tabs (Efavirenz-Emtricitab-Tenofovir) .... Take 1 Tablet By Mouth Once A Day 2)  Anacin 81 Mg  Tbec (Aspirin) .... Take  1 Tablet By Mouth Once A Day 3)  Trazodone Hcl 50 Mg Tabs (Trazodone Hcl) .... At Bedtime As Needed Insomnia  Current Medications (verified): 1)  Atripla 600-200-300 Mg Tabs (Efavirenz-Emtricitab-Tenofovir) .... Take 1 Tablet By Mouth Once A Day  Allergies: 1)  ! * Excedrin 2)  ! * Alka Seltzer   Preventive Screening-Counseling & Management  Alcohol-Tobacco     Alcohol drinks/day: 1     Alcohol type: occassioally     Smoking Status: current     Smoking Cessation Counseling: yes     Packs/Day: <0.25     Year Quit: quit in 2003     Passive Smoke Exposure: yes  Caffeine-Diet-Exercise     Caffeine use/day: 0     Does Patient Exercise: yes     Type of exercise: walking     Times/week: 5  Hep-HIV-STD-Contraception     HIV Risk: no  Safety-Violence-Falls     Seat Belt Use: yes  Comments: pt declined condoms      Sexual History:  currently monogamous.        Drug Use:  never.        Blood Transfusions:  no.        Travel History:  none.     Current Allergies: ! * EXCEDRIN ! * ALKA SELTZER Past History:  Past Medical History: Last updated: 01/24/2008 HIV disease Pneumocystis carinii pneumonia Herpes Simplex Virus II Elevated blood sugar Hyperlipidemia Rash on arms and penis High risk sexual behavior  Family History: Last updated:  12/31/2006 No early CAD. Mother with Alzheimers.  Social History: Last updated: 01/24/2008  Alcohol use-no Drug use-no Occupation: Disability Current Smoker  Risk Factors: Alcohol Use: 1 (04/26/2009) Caffeine Use: 0 (04/26/2009) Exercise: yes (04/26/2009)  Risk Factors: Smoking Status: current (04/26/2009) Packs/Day: <0.25 (04/26/2009) Passive Smoke Exposure: yes (04/26/2009)  Past Surgical History: None  Family History: Reviewed history from 12/31/2006 and no changes required. No early CAD. Mother with Alzheimers.  Social History: Reviewed history from 01/24/2008 and no changes required.  Alcohol  use-no Drug use-no Occupation: Disability Current Smoker  Review of Systems       The patient complains of depression.  The patient denies anorexia, fever, weight loss, weight gain, vision loss, decreased hearing, hoarseness, chest pain, syncope, dyspnea on exertion, peripheral edema, prolonged cough, headaches, hemoptysis, abdominal pain, melena, hematochezia, severe indigestion/heartburn, hematuria, incontinence, genital sores, muscle weakness, suspicious skin lesions, transient blindness, difficulty walking, unusual weight change, abnormal bleeding, and enlarged lymph nodes.    Vital Signs:  Patient profile:   50 year old male Height:      70.5 inches (179.07 cm) Weight:      190.4 pounds (86.55 kg) Temp:     98.5 degrees F (36.94 degrees C) oral Pulse rate:   77 / minute BP sitting:   123 / 77  (left arm)  Vitals Entered By: Wendall Mola CMA Duncan Dull) (April 26, 2009 2:15 PM) CC: follow-up visit, Lipid Management Is Patient Diabetic? No Pain Assessment Patient in pain? no      Nutritional Status BMI of 25 - 29 = overweight Nutritional Status Detail nl  Does patient need assistance? Functional Status Self care Ambulation Normal   Physical Exam  General:  alert and well-developed.   Head:  normocephalic and atraumatic.   Eyes:  vision grossly intact, pupils equal, pupils round, and pupils reactive to light.   Ears:  no external deformities.   Nose:  no external deformity.   Mouth:  pharynx pink and moist, no erythema, and no exudates.   Neck:  supple and full ROM.   Lungs:  normal respiratory effort, no intercostal retractions, no accessory muscle use, and normal breath sounds.   Heart:  normal rate, regular rhythm, no murmur, no gallop, and no rub.   Abdomen:  soft, non-tender, normal bowel sounds, and no distention.   Msk:  normal ROM.   Extremities:  No clubbing, cyanosis, edema, or deformity noted with normal full range of motion of all joints.    Neurologic:  alert & oriented X3.  strength normal in all extremities and gait normal.   Skin:  no rashes.  no ecchymoses.   Psych:  Oriented X3.  memory intact for recent and remote and good eye contact.          Medication Adherence: 04/26/2009   Adherence to medications reviewed with patient. Counseling to provide adequate adherence provided   Prevention For Positives: 04/26/2009   Safe sex practices discussed with patient. Condoms offered.   Education Materials Provided: 04/26/2009 Safe sex practices discussed with patient. Condoms offered.                          Impression & Recommendations:  Problem # 1:  HIV DISEASE (ICD-042)  Superb control! Diagnostics Reviewed:  HIV: CDC-defined AIDS (10/30/2008)   CD4: 440 (04/10/2009)   WBC: 4.1 (04/09/2009)   Hgb: 14.3 (04/09/2009)   HCT: 43.1 (04/09/2009)   Platelets: 212 (04/09/2009) HIV genotype: REPORT (03/17/2007)  HIV-1 RNA: 52 (04/09/2009)   HBSAg: No (04/27/2006)  Orders: Est. 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 The majority of his ssx are centered around his relationship with his girlfriend.  Orders: Est. Patient Level IV (60454)  Problem # 3:  HYPERLIPIDEMIA (ICD-272.4)  No need for intervention Labs Reviewed: SGOT: 24 (04/09/2009)   SGPT: 21 (04/09/2009)  Lipid Goals: Chol Goal: 200 (06/22/2008)   HDL Goal: 40 (06/22/2008)   LDL Goal: 130 (06/22/2008)   TG Goal: 150 (06/22/2008)  Prior 10 Yr Risk Heart Disease: 7 % (06/22/2008)   HDL:50 (04/09/2009), 57 (06/09/2008)  LDL:128 (04/09/2009), 103 (06/09/2008)  Chol:198 (04/09/2009), 185 (06/09/2008)  Trig:101 (04/09/2009), 123 (06/09/2008)  Orders: Est. Patient Level IV (09811)  Problem # 4:  SEXUAL ACTIVITY, HIGH RISK (ICD-V69.2)  have counseled  him that without protection that he could stil transmit HIV and contract other forms of it as well as other STDs and have counselled him to insist on using condoms with his girlfriend  Orders: Est. Patient Level IV (91478)  Other Orders: T-GC Probe, urine ([id number]) T-Chlamydia  Probe, urine [id number]) Future Orders: T-CD4SP ([facility]) (CD4SP) ... 06/14/2009 T-HIV Viral Load [id number]) ... 06/14/2009 T-CBC w/Diff ([id number]) ... 06/14/2009 T-Comprehensive Metabolic Panel [phone]) ... 06/14/2009  Lipid Assessment/Plan:      Based on NCEP/ATP III, the patient's risk factor category is "2 or more risk factors and a calculated 10 year CAD risk of < 20%".  The patient's lipid goals are as follows: Total cholesterol goal is 200; LDL cholesterol goal is 130; HDL cholesterol goal is 40; Triglyceride goal is 150.  His LDL cholesterol goal has been met.      Process Orders Check Orders Results:     [facility]: Check successful Tests Sent for requisitioning (April 26, 2009 9:22 PM):     04/26/2009: [facility] -- [employer], urine [phone] (signed)     04/26/2009: [facility] -- T-Chlamydia  Probe, urine [phone] (signed)     06/14/2009: [facility] -- T-HIV Viral Load [phone] (signed)     06/14/2009: [facility] -- T-CBC w/Diff [[id number]] (signed)     06/14/2009: [facility] -- T-Comprehensive Metabolic Panel [id number] (signed)

## 2010-04-04 NOTE — Medication Information (Signed)
Summary: [facility]: RX  [facility]: RX   Imported By: Florinda Marker 12/06/2009 10:15:58  _____________________________________________________________________  External Attachment:    Type:   Image     Comment:   External Document

## 2010-04-04 NOTE — Miscellaneous (Signed)
Summary: Orders Update  Clinical Lists Changes  Orders: Added new Test order of T-CBC w/Diff [phone]) - Signed     Process Orders Check Orders Results:     [facility]: Check successful Tests Sent for requisitioning (April 09, 2009 2:58 PM):     04/09/2009: [facility] -- [facility] w/Diff [[id number]] (signed)

## 2010-04-04 NOTE — Assessment & Plan Note (Signed)
Summary: F/U [Dean Carlson]   Visit Type:  Follow-up Primary Provider:  Paulette Blanch Dam MD  CC:  follow-up visit.  History of Present Illness: 50 year old [place] American male with HIV suppresed on Atripla cd4 in 400s. He returns to clinic for followup. He continues to be  "stressed out" by his relationship with his girlfriend.They have been having unprotected intercourse because he states that he cannot maintain an erection if he wears a condom. He is also concerned about his sperm count and quantitiy and is still having some ED issues. He denies overt depression at this time. He was counselled to use cdoms with his girlfriend with all forms of sexual intercourse and on risks of his transmitting virus (though this is lessened by his vl being undetectable) and risk of being superinfected with different form of HIV.  Problems Prior to Update: 1)  Mood Disorder in Conditions Classified Elsewhere  (ICD-293.83) 2)  Insomnia Unspecified  (ICD-780.52) 3)  Sexual Activity, High Risk  (ICD-V69.2) 4)  Encounter For Long-term Use of Other Medications  (ICD-V58.69) 5)  Skin Rash  (ICD-782.1) 6)  Preventive Health Care  (ICD-V70.0) 7)  Hyperlipidemia  (ICD-272.4) 8)  Erectile Dysfunction  (ICD-302.72) 9)  Hyperglycemia  (ICD-790.29) 10)  Disorder, Depressive Nec  (ICD-311) 11)  Chest Tightness  (ICD-786.59) 12)  HIV Disease  (ICD-042) 13)  Pneumocystis Pneumonia  (ICD-136.3) 14)  Hsv  (ICD-054.9)  Medications Prior to Update: 1)  Atripla 600-200-300 Mg Tabs (Efavirenz-Emtricitab-Tenofovir) .... Take 1 Tablet By Mouth Once A Day  Current Medications (verified): 1)  Atripla 600-200-300 Mg Tabs (Efavirenz-Emtricitab-Tenofovir) .... Take 1 Tablet By Mouth Once A Day  Allergies: 1)  ! * Excedrin 2)  ! * Alka Seltzer   Preventive Screening-Counseling & Management  Alcohol-Tobacco     Alcohol drinks/day: 1     Alcohol type: occassioally     Smoking Status: current     Smoking Cessation Counseling:  yes     Packs/Day: <0.25     Year Quit: quit in 2003     Passive Smoke Exposure: yes  Caffeine-Diet-Exercise     Caffeine use/day: 0     Does Patient Exercise: yes     Type of exercise: walking     Times/week: 5  Safety-Violence-Falls     Seat Belt Use: yes   Current Allergies (reviewed today): ! * EXCEDRIN ! * ALKA SELTZER Past History:  Past Medical History: Last updated: 01/24/2008 HIV disease Pneumocystis carinii pneumonia Herpes Simplex Virus II Elevated blood sugar Hyperlipidemia Rash on arms and penis High risk sexual behavior  Past Surgical History: Last updated: 04/26/2009 None  Family History: Last updated: 12/31/2006 No early CAD. Mother with Alzheimers.  Social History: Last updated: 01/24/2008  Alcohol use-no Drug use-no Occupation: Disability Current Smoker  Risk Factors: Alcohol Use: 1 (11/14/2009) Caffeine Use: 0 (11/14/2009) Exercise: yes (11/14/2009)  Risk Factors: Smoking Status: current (11/14/2009) Packs/Day: <0.25 (11/14/2009) Passive Smoke Exposure: yes (11/14/2009)  Family History: Reviewed history from 12/31/2006 and no changes required. No early CAD. Mother with Alzheimers.  Social History: Reviewed history from 01/24/2008 and no changes required.  Alcohol use-no Drug use-no Occupation: Disability Current Smoker  Review of Systems  The patient denies anorexia, fever, weight loss, weight gain, vision loss, decreased hearing, hoarseness, chest pain, syncope, dyspnea on exertion, peripheral edema, prolonged cough, headaches, hemoptysis, abdominal pain, melena, hematochezia, severe indigestion/heartburn, hematuria, incontinence, genital sores, muscle weakness, suspicious skin lesions, transient blindness, difficulty walking, depression, unusual weight change, abnormal bleeding, and  enlarged lymph nodes.    Vital Signs:  Patient profile:   50 year old male Height:      70.5 inches (179.07 cm) Weight:      192.0 pounds  (87.27 kg) BMI:     27.26 Temp:     98.3 degrees F (36.83 degrees C) oral Pulse rate:   75 / minute BP sitting:   117 / 81  (left arm)  Vitals Entered By: Baxter Hire) (November 14, 2009 8:47 AM) CC: follow-up visit Pain Assessment Patient in pain? no      Nutritional Status BMI of 25 - 29 = overweight Nutritional Status Detail appetite is good per patient  Have you ever been in a relationship where you felt threatened, hurt or afraid?No   Does patient need assistance? Functional Status Self care Ambulation Normal   Physical Exam  General:  alert and well-developed.   Head:  normocephalic and atraumatic.   Eyes:  vision grossly intact, pupils equal, pupils round, and pupils reactive to light.   Ears:  no external deformities.   Nose:  no external deformity.   Mouth:  pharynx pink and moist, no erythema, and no exudates.   Neck:  supple and full ROM.   Lungs:  normal respiratory effort, no intercostal retractions, no accessory muscle use, and normal breath sounds.   Heart:  normal rate, regular rhythm, no murmur, no gallop, and no rub.   Msk:  normal ROM.   Extremities:  No clubbing, cyanosis, edema, or deformity noted with normal full range of motion of all joints.   Neurologic:  alert & oriented X3.  strength normal in all extremities and gait normal.   Skin:  no rashes.  no ecchymoses.   Psych:  Oriented X3.  memory intact for recent and remote and good eye contact.     Impression & Recommendations:  Problem # 1:  HIV DISEASE (ICD-042)  Rechek labs he has been very well controlled Diagnostics Reviewed:  HIV: CDC-defined AIDS (10/30/2008)   CD4: 470 (07/06/2009)   WBC: 5.5 (07/06/2009)   Hgb: 14.1 (07/06/2009)   HCT: 42.5 (07/06/2009)   Platelets: 183 (07/06/2009) HIV genotype: REPORT (03/17/2007)   HIV-1 RNA: <48 copies/mL (07/06/2009)   HBSAg: No (04/27/2006)  Orders: Est. Patient Level IV (16109)  Problem # 2:  SEXUAL ACTIVITY, HIGH RISK  (ICD-V69.2)  counselled to use condoms  Orders: Est. Patient Level IV (60454)  Problem # 3:  ERECTILE DYSFUNCTION (ICD-302.72)  check testosterone  Orders: Est. Patient Level IV (09811)  Problem # 4:  HYPERLIPIDEMIA (ICD-272.4) recheck lipids Orders: T-Testosterone; Total [phone])  Other Orders: T-CD4SP [facility]) (CD4SP) T-HIV Viral Load [phone]) T-Comprehensive Metabolic Panel [phone]) T-Lipid Profile [phone]) T-CBC w/Diff [phone]) Future Orders: T-CD4SP ([facility]) (CD4SP) ... 05/13/2010 T-HIV Viral Load [phone]) ... 05/13/2010 T-CBC w/Diff ([id number]) ... 05/13/2010 T-Comprehensive Metabolic Panel [id number]) ... 05/13/2010 T-RPR (Syphilis) [id number]) ... 05/13/2010 T-Lipid Profile [id number]) ... 05/13/2010       Medication Adherence: 11/14/2009   Adherence to medications reviewed with patient. Counseling to provide adequate adherence provided   Prevention For Positives: 11/14/2009   Safe sex practices discussed with patient. Condoms offered.   Education Materials Provided: 11/14/2009 Safe sex practices discussed with patient. Condoms offered.

## 2010-04-24 ENCOUNTER — Telehealth: Payer: Self-pay | Admitting: Infectious Disease

## 2010-04-30 NOTE — Progress Notes (Signed)
  Phone Note Outgoing Call   Call placed by: Acey Lav MD,  April 24, 2010 4:25 PM Details for Reason: Updateing flu vaccination Summary of Call: Pt given flu shot in house on 11/24/2009 Initial call taken by: Acey Lav MD,  April 24, 2010 4:25 PM

## 2010-04-30 NOTE — Progress Notes (Signed)
    Immunization History:  Influenza Immunization History:    Influenza:  historical (11/24/2009)

## 2010-05-01 ENCOUNTER — Other Ambulatory Visit: Payer: Self-pay

## 2010-05-06 ENCOUNTER — Other Ambulatory Visit: Payer: Self-pay | Admitting: Infectious Disease

## 2010-05-06 ENCOUNTER — Other Ambulatory Visit (INDEPENDENT_AMBULATORY_CARE_PROVIDER_SITE_OTHER): Payer: Medicare Other

## 2010-05-06 ENCOUNTER — Encounter: Payer: Self-pay | Admitting: Infectious Disease

## 2010-05-06 DIAGNOSIS — B20 Human immunodeficiency virus [HIV] disease: Secondary | ICD-10-CM

## 2010-05-06 LAB — CONVERTED CEMR LAB
AST: 18 units/L (ref 0–37)
Albumin: 4.6 g/dL (ref 3.5–5.2)
Alkaline Phosphatase: 78 units/L (ref 39–117)
Basophils Absolute: 0 10*3/uL (ref 0.0–0.1)
Basophils Relative: 0 % (ref 0–1)
Chloride: 105 meq/L (ref 96–112)
Cholesterol: 198 mg/dL (ref 0–200)
HDL: 39 mg/dL — ABNORMAL LOW (ref >39)
MCHC: 33.5 g/dL (ref 30.0–36.0)
Monocytes Absolute: 0.6 10*3/uL (ref 0.1–1.0)
Neutro Abs: 2.1 10*3/uL (ref 1.7–7.7)
Neutrophils Relative %: 45 % (ref 43–77)
Platelets: 260 10*3/uL (ref 150–400)
Potassium: 4.2 meq/L (ref 3.5–5.3)
RDW: 13.6 % (ref 11.5–15.5)
Sodium: 142 meq/L (ref 135–145)
Total Protein: 7.3 g/dL (ref 6.0–8.3)
Triglycerides: 241 mg/dL — ABNORMAL HIGH (ref <150)

## 2010-05-07 LAB — T-HELPER CELL (CD4) - (RCID CLINIC ONLY): CD4 T Cell Abs: 500 uL (ref 400–2700)

## 2010-05-15 ENCOUNTER — Ambulatory Visit: Payer: Self-pay | Admitting: Infectious Disease

## 2010-05-16 LAB — DIFFERENTIAL
Basophils Relative: 0 % (ref 0–1)
Eosinophils Absolute: 0.1 10*3/uL (ref 0.0–0.7)
Eosinophils Relative: 2 % (ref 0–5)
Lymphs Abs: 2.9 10*3/uL (ref 0.7–4.0)
Monocytes Relative: 11 % (ref 3–12)
Neutrophils Relative %: 43 % (ref 43–77)

## 2010-05-16 LAB — CARDIAC PANEL(CRET KIN+CKTOT+MB+TROPI)
Relative Index: 1.7 (ref 0.0–2.5)
Relative Index: 1.9 (ref 0.0–2.5)
Total CK: 124 U/L (ref 7–232)
Troponin I: 0.02 ng/mL (ref 0.00–0.06)

## 2010-05-16 LAB — POCT I-STAT, CHEM 8
Calcium, Ion: 1.06 mmol/L — ABNORMAL LOW (ref 1.12–1.32)
Chloride: 109 mEq/L (ref 96–112)
Glucose, Bld: 116 mg/dL — ABNORMAL HIGH (ref 70–99)
HCT: 48 % (ref 39.0–52.0)
Hemoglobin: 16.3 g/dL (ref 13.0–17.0)
TCO2: 25 mmol/L (ref 0–100)

## 2010-05-16 LAB — RAPID URINE DRUG SCREEN, HOSP PERFORMED
Benzodiazepines: NOT DETECTED
Cocaine: NOT DETECTED
Opiates: NOT DETECTED
Tetrahydrocannabinol: NOT DETECTED

## 2010-05-16 LAB — CBC
Hemoglobin: 15.3 g/dL (ref 13.0–17.0)
MCH: 31.7 pg (ref 26.0–34.0)
MCHC: 35.2 g/dL (ref 30.0–36.0)
MCV: 90.2 fL (ref 78.0–100.0)

## 2010-05-16 LAB — T-HELPER CELL (CD4) - (RCID CLINIC ONLY): CD4 T Cell Abs: 520 uL (ref 400–2700)

## 2010-05-16 LAB — URINALYSIS, ROUTINE W REFLEX MICROSCOPIC
Bilirubin Urine: NEGATIVE
Glucose, UA: NEGATIVE mg/dL
Nitrite: NEGATIVE
Specific Gravity, Urine: 1.026 (ref 1.005–1.030)
pH: 6.5 (ref 5.0–8.0)

## 2010-05-16 LAB — POCT CARDIAC MARKERS: Troponin i, poc: <0.05 ng/mL (ref 0.00–0.09)

## 2010-05-16 LAB — HEMOGLOBIN A1C
Hgb A1c MFr Bld: 5.6 % (ref <5.7)
Mean Plasma Glucose: 114 mg/dL (ref <117)

## 2010-05-16 LAB — TSH: TSH: 0.525 u[IU]/mL (ref 0.350–4.500)

## 2010-05-16 LAB — PROTIME-INR
INR: 0.93 (ref 0.00–1.49)
Prothrombin Time: 12.7 seconds (ref 11.6–15.2)

## 2010-05-21 LAB — T-HELPER CELL (CD4) - (RCID CLINIC ONLY)
CD4 % Helper T Cell: 25 % — ABNORMAL LOW (ref 33–55)
CD4 T Cell Abs: 470 uL (ref 400–2700)

## 2010-06-08 LAB — T-HELPER CELL (CD4) - (RCID CLINIC ONLY): CD4 T Cell Abs: 480 uL (ref 400–2700)

## 2010-06-24 ENCOUNTER — Ambulatory Visit: Payer: Self-pay | Admitting: Infectious Disease

## 2010-07-03 ENCOUNTER — Ambulatory Visit: Payer: Medicare Other | Admitting: Infectious Disease

## 2010-07-16 ENCOUNTER — Ambulatory Visit: Payer: Medicare Other | Admitting: Infectious Disease

## 2010-07-16 NOTE — Assessment & Plan Note (Signed)
[facility]                            CARDIOLOGY OFFICE NOTE   YASH, CACCIOLA                         MRN:          [id number]  DATE:10/27/2007                            DOB:          January 16, 1961    I was asked by Dr. Paulette Blanch Dam to consult on Dean Carlson with  some chest discomfort.   Dean Carlson is a 50 year old black male who has been having some chest  tightness.  It usually happens to him when he is at rest.  He does not  have any exertional symptoms.  On occasion, he will get little shortness  of breath with exertion.   Denies any orthopnea, PND, or peripheral edema.  He has had no  palpitations.  He has had no presyncope or syncope.   He carries a diagnosis of HIV, since the mid 90s.  He has a history of  recent new-onset diabetes with hyperglycemia.  He has also had a history  of Pneumocystis pneumonia.   His past medical history, he does not smoke.  He quit in 2003.  He does  not use recreational products or drugs.  He does have two 40 ounces  beers a month.   His most recent cholesterol was 210, triglycerides 84, HDL 51, LDL 142.   CURRENT MEDICATIONS:  1. Atripla 600/200/300 mg tabs 1 tablet every day.  2. He also takes Anacin 81 mg a day.   Past surgical history is none.   He specifies allergies to [facility] and EXCEDRIN.   His family history is negative for premature coronary disease.   SOCIAL HISTORY:  He is disabled.  He lists no marital status and no  children.   REVIEW OF SYSTEMS:  He had asthma as a child which he outgrew.  He has  gastroesophageal reflux, history of depression.  Rest of his review of  systems are negative.   PHYSICAL EXAMINATION:  VITAL SIGNS:  Blood pressure is 129/84, his pulse  is 70 by EKG.  His EKG is completely normal.  He weighs 187 pounds.  He  is in no acute distress.  HEENT:  Normocephalic, atraumatic.  PERRLA.  Extraocular movements  intact.  Sclerae slightly muddy.  Facial  symmetry is normal.  Dentition  satisfactory.  NECK:  Supple.  Carotid upstrokes are equal bilaterally without bruits.  No JVD.  Thyroid is not enlarged.  Trachea is midline.  There is no  obvious lymphadenopathy.  LUNGS:  Clear to auscultation and percussion.  HEART:  Reveals a nondisplaced PMI.  There is no right ventricular lift.  There is no rub.  ABDOMEN:  Soft.  Good bowel sounds.  No midline bruit.  No hepatomegaly.  EXTREMITIES:  There is no cyanosis, clubbing, or edema.  Pulses are  intact.  NEURO:  Intact.   I had a long talk with Dean Carlson.  We will perform a 2-D echocardiogram  to make sure he does not have any right-sided involvement or any other  structural heart problem.  I do not think he is having coronary  associated pain.  I do  not think he needs a stress test.   His echo was normal, we will reassuring him, see him back on a p.r.n.  basis.     Thomas C. Daleen Squibb, MD, [facility]  Electronically Signed    TCW/MedQ  DD: 10/27/2007  DT: 10/28/2007  Job #: [id number]   cc:   Acey Lav, MD

## 2010-07-19 NOTE — Op Note (Signed)
   NAME:  MATIN, MATTIOLI NO.:  [id number]   MEDICAL RECORD NO.:  [id number]                   PATIENT TYPE:  INP   LOCATION:  5507                                 FACILITY:  [facility]   PHYSICIAN:  Charlaine Dalton. Sherene Sires, M.D. [facility]           DATE OF BIRTH:  Feb 27, 1961   DATE OF PROCEDURE:  09/29/2001  DATE OF DISCHARGE:                                 OPERATIVE REPORT   PROCEDURE:  Fiberoptic bronchoscopy and diagnostic lavage.   SURGEON:  Charlaine Dalton. Sherene Sires, M.D.   HISTORY:  Please see handwritten notes.  This is a 50 year old black male  with documented HIV and diffuse infiltrates with no specific diagnosis,  unable to cough up sputum for sampling, despite efforts at induction  therapy.  He agreed to proceed with a fiberoptic bronchoscopy, diagnostic  with lavage, today after a full discussion of the risks, benefits and  alternatives yesterday.   DESCRIPTION OF PROCEDURE:  Procedure #1:  The procedure was done in his room  in 2300 with continuous monitoring by surface ECG and oximetry.  He  maintained adequate saturations throughout the procedure.  He was given a  total of 2.5 mg of IV Versed and 50 mg of IV Demerol for adequate sedation  and cough suppression.  The right naris and oropharynx were liberally  anesthetized with 10% lidocaine spray.  The right naris was additionally  prepared with 2% lidocaine jelly.  Using a standard flexible bronchoscope, the right naris was easily  cannulated with good visualization of the entire oropharynx and larynx.  The  cords moved normally and there were no apparent upper airway lesions to my  review.  An additional 1% lidocaine is needed and the entire  tracheobronchial tree was explored bilaterally with the following findings:  1. Trachea, carina and all the major airways opened widely on inspiration     with no evidence of any endobronchial or mucosal abnormality.   Procedure #2:  Using a wedge positioned within the  lingula, an adequate  sampling was obtained by saline lavage.  The patient tolerated the procedure  well.  Samples were sent as follows:  Bronchial lavage for cytology, AFB and  fungal-staining culture, Legionella and PCP stain.                                                Charlaine Dalton. Sherene Sires, M.D. [facility]    MBW/MEDQ  D:  09/29/2001  T:  10/05/2001  Job:  [id number]

## 2010-07-19 NOTE — Discharge Summary (Signed)
NAME:  Dean Carlson, Dean Carlson NO.:  [id number]   MEDICAL RECORD NO.:  [id number]                   PATIENT TYPE:  INP   LOCATION:  5507                                 FACILITY:  [facility]   PHYSICIAN:  Lazaro Arms, MD          DATE OF BIRTH:  [birth year]   DATE OF ADMISSION:  09/26/2001  DATE OF DISCHARGE:  10/07/2001                                 DISCHARGE SUMMARY   HISTORY OF PRESENT ILLNESS:  The patient was admitted on September 26, 2001, with  fever, vomiting, weight loss, left-sided chest pain, and a long history of  HIV infection which has been untreated.  The patient was found to have  bilateral pneumonia.  The working diagnosis on admission was Pneumocystis  carinii pneumonia after TB had been ruled out.  His other problems on  admission included chronic watery diarrhea as well as general debilitation.   HOSPITAL COURSE:  1. Pulmonary:  The patient was started empirically on Bactrim and steroids     for PCP on September 28, 2001, although sputum was negative and the bronchial     washings on bronchoscopy performed on September 29, 2001, were also negative.     However, that was also negative for AFB.  The patient clinically improved     throughout the course of his hospital stay and progressed to being on     room air with no respiratory symptoms.   1. Human immunodeficiency virus:  His CD4 count was 10, and his viral load     was 596,000.  Plans are to start HAART therapy as an outpatient.  His     diarrhea remained at baseline, although perhaps improved after     discontinuing his Protonix.   1. Dermatologic:  The patient had some seborrheic dermatitis for which he     was put on Diflucan as well as had developed probable HIV-2 scrotal     lesions, one in his perianal area, one on his scrotum.  The patient     remained asymptomatic and on the day of dictation was afebrile, with     normal blood pressure and pulse.  He was ambulating around the room     without assistance, was eating most of his meals, and was feeling well.     Heart and lungs and abdomen exam were within normal limits.  The genital     lesions were small and present, but there was no sign of any     superinfection.  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   DISCHARGE MEDICATIONS:  1. Bactrim DS 2 tablets p.o. q.8h. until October 18, 2001, and then this will     be tapered to 1 tablet once a day, which he will remain on for     prophylaxis for PCP.  2. Prednisone taper.  He will be on 40 mg once a day until August 9, and     then drop to 20 mg once a day until October 18, 2001, and then stop.  3. Azithromycin 2 tablets once a week, which he will remain on for MAC     prophylaxis.  4. He will remain on ketoconazole 2% and 1% hydrocortisone cream applied     topically to his rash.  5. He will also take Diflucan 100 mg once a day for seven days if he was to     develop a rash again.  6. He also will take acyclovir 400 mg twice a day until October 08, 2001.   DISCHARGE DIAGNOSES:  1. Pneumocystis carinii pneumonia.  2. Advanced human immunodeficiency virus infection.  3. Genital herpes.  4. Seborrheic dermatitis.   FOLLOW-UP:  His follow-up appointment will be October 22, 2001, at the [facility] here at [facility] with Dr. Roxan Hockey, telephone number [phone].                                               Lazaro Arms, MD    AC/MEDQ  D:  10/06/2001  T:  10/06/2001  Job:  [id number]

## 2010-07-22 ENCOUNTER — Ambulatory Visit (INDEPENDENT_AMBULATORY_CARE_PROVIDER_SITE_OTHER): Payer: Medicare Other | Admitting: Infectious Disease

## 2010-07-22 ENCOUNTER — Encounter: Payer: Self-pay | Admitting: Infectious Disease

## 2010-07-22 VITALS — BP 116/71 | HR 91 | Temp 98.4°F | Ht 72.0 in | Wt 211.0 lb

## 2010-07-22 DIAGNOSIS — E291 Testicular hypofunction: Secondary | ICD-10-CM

## 2010-07-22 DIAGNOSIS — F17200 Nicotine dependence, unspecified, uncomplicated: Secondary | ICD-10-CM

## 2010-07-22 DIAGNOSIS — F0630 Mood disorder due to known physiological condition, unspecified: Secondary | ICD-10-CM

## 2010-07-22 DIAGNOSIS — Z113 Encounter for screening for infections with a predominantly sexual mode of transmission: Secondary | ICD-10-CM | POA: Insufficient documentation

## 2010-07-22 DIAGNOSIS — B20 Human immunodeficiency virus [HIV] disease: Secondary | ICD-10-CM

## 2010-07-22 NOTE — Assessment & Plan Note (Signed)
I have asked him to consider electronic cigarettes s. Also will consider nicotine patches.

## 2010-07-22 NOTE — Progress Notes (Signed)
Subjective:    Patient ID: Dean Carlson, male    DOB: 1960/05/12, 50 y.o.   MRN: [id number]  HPI Dean Carlson is a 50 year old American male with HIV this quite well controlled on his Atripla with undetectable viral load and a CD4 count in excess of 500. He returns today for routine followup. On exam he is slightly dysphoric and he again is quite preoccupied with his relationship with it with his girlfriend. He is still concerned that she has been unfaithful to him and is being unfaithful to him. He is concerned about whether or not they'll continue to continue a relationship or not. I offered to have him see one of our counselors with [facility] but he didn't wants to talk to her house at this time. He endorsed depression but denied active or passive suicidal ideation or homicidal ideation. He was not interested in started starting a selective serotonin reuptake inhibitor. He has not worsened tobacco intake he is now smoking 2 packs per day. He blames this again on his girlfriend who was smoking before he states that he started smoking to help her stop smoking and now she is stopped and he is still smoking and smoking 2 packs per day. He is no longer smoking and marijuana. We discussed smoking cessation measures. I suggested nicotine patch. Also just suggested an electronic cigarette which might reduce the carcinogen intake. Finally he is concerned about decreased libido decreased sexual drive is wondering about his testosterone level. In reviewing the chart I found his testosterone level was slightly below normal when checked in September 2011. In all I spent greater than 45 minutes with Dean Carlson  including greater than 50% of time in counseling the patient and coordinating his care.   Review of Systems  Constitutional: Negative for chills, diaphoresis, activity change and appetite change.  HENT: Negative for neck pain and neck stiffness.   Eyes: Negative for photophobia and visual  disturbance.  Respiratory: Negative for apnea, cough, chest tightness and shortness of breath.   Cardiovascular: Negative for chest pain, palpitations and leg swelling.  Gastrointestinal: Negative for abdominal pain, blood in stool, abdominal distention and anal bleeding.  Genitourinary: Negative for flank pain and difficulty urinating.  Musculoskeletal: Negative for myalgias, back pain, joint swelling and arthralgias.  Skin: Negative for color change, pallor and rash.  Neurological: Negative for dizziness, light-headedness and headaches.  Hematological: Negative for adenopathy. Does not bruise/bleed easily.  Psychiatric/Behavioral: Positive for dysphoric mood. Negative for hallucinations and confusion.       Objective:   Physical Exam  Constitutional: He is oriented to person, place, and time. He appears well-developed and well-nourished. No distress.  HENT:  Head: Normocephalic and atraumatic.  Eyes: Conjunctivae and EOM are normal. Pupils are equal, round, and reactive to light. No scleral icterus.  Neck: Normal range of motion. Neck supple.  Cardiovascular: Normal rate, regular rhythm and normal heart sounds.  Exam reveals no gallop and no friction rub.   No murmur heard. Pulmonary/Chest: No respiratory distress. He has no wheezes. He has no rales.  Abdominal: He exhibits no distension. There is no tenderness. There is no rebound.  Musculoskeletal: He exhibits no edema and no tenderness.  Lymphadenopathy:    He has no cervical adenopathy.  Neurological: He is alert and oriented to person, place, and time. Coordination normal.  Skin: Skin is warm and dry. No rash noted. He is not diaphoretic. No erythema. No pallor.  Psychiatric: His mood appears not anxious. His affect is  not angry, not blunt, not labile and not inappropriate. His speech is not rapid and/or pressured, not delayed, not tangential and not slurred. He is withdrawn. He is not agitated, not aggressive, is not hyperactive,  not slowed, not actively hallucinating and not combative. Thought content is not paranoid and not delusional. He does not express impulsivity or inappropriate judgment. He exhibits a depressed mood. He expresses no homicidal and no suicidal ideation. He expresses no suicidal plans and no homicidal plans. He is communicative. He exhibits normal recent memory and normal remote memory. He is attentive.          Assessment & Plan:  HIV DISEASE Superbly well controlled will recheck viral load and CD4 count today.  Hypogonadism male Recheck testosterone today. Will likely be low again we'll then start him on testosterone replacement therapy.  MOOD DISORDER IN CONDITIONS CLASSIFIED ELSEWHERE He continues to be depressed and is still preoccupied with the difficulties with his relationship have offered selected selective serotonin reuptake inhibitor) referral to counseling but he does not want either this point time his contract for safety.  Smoker I have asked him to consider electronic cigarettes s. Also will consider nicotine patches.

## 2010-07-22 NOTE — Assessment & Plan Note (Signed)
Recheck testosterone today. Will likely be low again we'll then start him on testosterone replacement therapy.

## 2010-07-22 NOTE — Assessment & Plan Note (Signed)
He continues to be depressed and is still preoccupied with the difficulties with his relationship have offered selected selective serotonin reuptake inhibitor) referral to counseling but he does not want either this point time his contract for safety.

## 2010-07-22 NOTE — Assessment & Plan Note (Signed)
Superbly well controlled will recheck viral load and CD4 count today.

## 2010-07-23 ENCOUNTER — Telehealth: Payer: Self-pay | Admitting: Infectious Disease

## 2010-07-23 DIAGNOSIS — E291 Testicular hypofunction: Secondary | ICD-10-CM

## 2010-07-23 LAB — T-HELPER CELL (CD4) - (RCID CLINIC ONLY)
CD4 % Helper T Cell: 25 % — ABNORMAL LOW (ref 33–55)
CD4 T Cell Abs: 470 uL (ref 400–2700)

## 2010-07-23 LAB — HIV-1 RNA QUANT-NO REFLEX-BLD
HIV 1 RNA Quant: <20 {copies}/mL
HIV-1 RNA Quant, Log: <1.3 {Log}

## 2010-07-23 LAB — TESTOSTERONE: Testosterone: 195.49 ng/dL — ABNORMAL LOW (ref 250–890)

## 2010-07-23 MED ORDER — TESTOSTERONE 50 MG/5GM (1%) TD GEL: 5.0000 g | Freq: Every day | TRANSDERMAL | Status: DC

## 2010-07-23 NOTE — Telephone Encounter (Signed)
Testosterone is low. Can we start him on testosterone.

## 2010-11-20 LAB — T-HELPER CELL (CD4) - (RCID CLINIC ONLY): CD4 % Helper T Cell: 16 — ABNORMAL LOW

## 2010-11-27 LAB — T-HELPER CELL (CD4) - (RCID CLINIC ONLY)
CD4 % Helper T Cell: 21 — ABNORMAL LOW
CD4 T Cell Abs: 280 — ABNORMAL LOW

## 2010-12-03 LAB — T-HELPER CELL (CD4) - (RCID CLINIC ONLY)
CD4 % Helper T Cell: 20 — ABNORMAL LOW
CD4 T Cell Abs: 290 — ABNORMAL LOW

## 2010-12-09 ENCOUNTER — Other Ambulatory Visit: Payer: Medicare Other

## 2010-12-09 ENCOUNTER — Other Ambulatory Visit: Payer: Self-pay | Admitting: *Deleted

## 2010-12-09 ENCOUNTER — Other Ambulatory Visit: Payer: Self-pay | Admitting: Infectious Disease

## 2010-12-09 ENCOUNTER — Other Ambulatory Visit: Payer: Self-pay | Admitting: Licensed Clinical Social Worker

## 2010-12-09 DIAGNOSIS — Z113 Encounter for screening for infections with a predominantly sexual mode of transmission: Secondary | ICD-10-CM

## 2010-12-09 DIAGNOSIS — F0630 Mood disorder due to known physiological condition, unspecified: Secondary | ICD-10-CM

## 2010-12-09 DIAGNOSIS — F17200 Nicotine dependence, unspecified, uncomplicated: Secondary | ICD-10-CM

## 2010-12-09 DIAGNOSIS — E291 Testicular hypofunction: Secondary | ICD-10-CM

## 2010-12-09 DIAGNOSIS — B20 Human immunodeficiency virus [HIV] disease: Secondary | ICD-10-CM

## 2010-12-09 LAB — CBC WITH DIFFERENTIAL/PLATELET
Basophils Absolute: 0 10*3/uL (ref 0.0–0.1)
HCT: 43.5 % (ref 39.0–52.0)
Hemoglobin: 14.8 g/dL (ref 13.0–17.0)
Lymphocytes Relative: 33 % (ref 12–46)
Monocytes Absolute: 0.8 10*3/uL (ref 0.1–1.0)
Monocytes Relative: 13 % — ABNORMAL HIGH (ref 3–12)
Neutro Abs: 3.2 10*3/uL (ref 1.7–7.7)
Neutrophils Relative %: 52 % (ref 43–77)
RDW: 13 % (ref 11.5–15.5)
WBC: 6.3 10*3/uL (ref 4.0–10.5)

## 2010-12-09 LAB — COMPLETE METABOLIC PANEL WITH GFR
Alkaline Phosphatase: 81 U/L (ref 39–117)
BUN: 14 mg/dL (ref 6–23)
GFR, Est Non African American: >60 mL/min (ref >60)
Glucose, Bld: 90 mg/dL (ref 70–99)
Total Bilirubin: 0.3 mg/dL (ref 0.3–1.2)

## 2010-12-09 LAB — RPR

## 2010-12-09 MED ORDER — EFAVIRENZ-EMTRICITAB-TENOFOVIR 600-200-300 MG PO TABS: 1.0000 | ORAL_TABLET | Freq: Every day | ORAL | Status: DC

## 2010-12-09 MED ORDER — TESTOSTERONE 50 MG/5GM (1%) TD GEL: 5.0000 g | Freq: Every day | TRANSDERMAL | Status: DC

## 2010-12-10 ENCOUNTER — Other Ambulatory Visit: Payer: Self-pay | Admitting: *Deleted

## 2010-12-10 DIAGNOSIS — F0630 Mood disorder due to known physiological condition, unspecified: Secondary | ICD-10-CM

## 2010-12-10 DIAGNOSIS — E291 Testicular hypofunction: Secondary | ICD-10-CM

## 2010-12-10 DIAGNOSIS — F17200 Nicotine dependence, unspecified, uncomplicated: Secondary | ICD-10-CM

## 2010-12-10 DIAGNOSIS — B20 Human immunodeficiency virus [HIV] disease: Secondary | ICD-10-CM

## 2010-12-10 DIAGNOSIS — Z113 Encounter for screening for infections with a predominantly sexual mode of transmission: Secondary | ICD-10-CM

## 2010-12-10 LAB — T-HELPER CELL (CD4) - (RCID CLINIC ONLY)
CD4 % Helper T Cell: 25 % — ABNORMAL LOW (ref 33–55)
CD4 T Cell Abs: 550 uL (ref 400–2700)

## 2010-12-10 MED ORDER — EFAVIRENZ-EMTRICITAB-TENOFOVIR 600-200-300 MG PO TABS: 1.0000 | ORAL_TABLET | Freq: Every day | ORAL | Status: DC

## 2010-12-17 ENCOUNTER — Other Ambulatory Visit: Payer: Self-pay | Admitting: *Deleted

## 2010-12-19 LAB — T-HELPER CELL (CD4) - (RCID CLINIC ONLY): CD4 T Cell Abs: 240 — ABNORMAL LOW

## 2010-12-23 ENCOUNTER — Ambulatory Visit (INDEPENDENT_AMBULATORY_CARE_PROVIDER_SITE_OTHER): Payer: Medicare Other | Admitting: Infectious Disease

## 2010-12-23 ENCOUNTER — Encounter: Payer: Self-pay | Admitting: Infectious Disease

## 2010-12-23 VITALS — BP 122/83 | HR 74 | Temp 98.0°F | Wt 194.0 lb

## 2010-12-23 DIAGNOSIS — F329 Major depressive disorder, single episode, unspecified: Secondary | ICD-10-CM

## 2010-12-23 DIAGNOSIS — E785 Hyperlipidemia, unspecified: Secondary | ICD-10-CM

## 2010-12-23 DIAGNOSIS — B20 Human immunodeficiency virus [HIV] disease: Secondary | ICD-10-CM

## 2010-12-23 DIAGNOSIS — F17200 Nicotine dependence, unspecified, uncomplicated: Secondary | ICD-10-CM

## 2010-12-23 DIAGNOSIS — Z23 Encounter for immunization: Secondary | ICD-10-CM

## 2010-12-23 DIAGNOSIS — E291 Testicular hypofunction: Secondary | ICD-10-CM

## 2010-12-23 DIAGNOSIS — Z113 Encounter for screening for infections with a predominantly sexual mode of transmission: Secondary | ICD-10-CM

## 2010-12-23 NOTE — Assessment & Plan Note (Signed)
He is smoking less than pack per month. Will continue to try to stop

## 2010-12-23 NOTE — Assessment & Plan Note (Signed)
Recheck his testosterone today

## 2010-12-23 NOTE — Assessment & Plan Note (Signed)
Contracted for safety, doesn't want SSRI

## 2010-12-23 NOTE — Assessment & Plan Note (Signed)
Perfect control 

## 2010-12-23 NOTE — Progress Notes (Signed)
  Subjective:    Patient ID: Dean Carlson, male    DOB: [birth year], 50 y.o.   MRN: [id number]  HPI  Dean Carlson is a 50 y.o. male who is doing superbly well on their antiviral regimen, with undetectable viral load and health cd4 count. He returns today for routine visit today. He is doing well but has a few complaints.  He is still suffering from some depression related to breakup with his girlfriend 5 months ago. 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  At times he is having passive suicidal thoughts but no active thoughts and he is contracted for safety. He does not want SSRI He also is suffering from some lowerback pain that he believes is due to his mattress. He finally has been constipated for more than  A week with no bowel movements. He is eating fatty greasy foods but not many vegetables. Not tried any meds yet and is short on money   Review of Systems  Constitutional: Negative for fever, chills, diaphoresis, activity change, appetite change, fatigue and unexpected weight change.  HENT: Negative for congestion, sore throat, rhinorrhea, sneezing, trouble swallowing and sinus pressure.   Eyes: Negative for photophobia and visual disturbance.  Respiratory: Negative for cough, chest tightness, shortness of breath, wheezing and stridor.   Cardiovascular: Negative for chest pain, palpitations and leg swelling.  Gastrointestinal: Negative for nausea, vomiting, abdominal pain, diarrhea, constipation, blood in stool, abdominal distention and anal bleeding.  Genitourinary: Negative for dysuria, hematuria, flank pain and difficulty urinating.  Musculoskeletal: Negative for myalgias, back pain, joint swelling, arthralgias and gait problem.  Skin: Negative for color change, pallor, rash and wound.  Neurological: Negative for dizziness, tremors, weakness and light-headedness.  Hematological: Negative for adenopathy. Does not bruise/bleed easily.  Psychiatric/Behavioral: Negative  for behavioral problems, confusion, sleep disturbance, dysphoric mood, decreased concentration and agitation.       Objective:   Physical Exam  Constitutional: He is oriented to person, place, and time. He appears well-developed and well-nourished. No distress.  HENT:  Head: Normocephalic and atraumatic.  Mouth/Throat: Oropharynx is clear and moist. No oropharyngeal exudate.  Eyes: Conjunctivae and EOM are normal. Pupils are equal, round, and reactive to light. No scleral icterus.  Neck: Normal range of motion. Neck supple. No JVD present.  Cardiovascular: Normal rate, regular rhythm and normal heart sounds.  Exam reveals no gallop and no friction rub.   No murmur heard. Pulmonary/Chest: Effort normal and breath sounds normal. No respiratory distress. He has no wheezes. He has no rales. He exhibits no tenderness.  Abdominal: He exhibits no distension and no mass. There is no tenderness. There is no rebound and no guarding.  Musculoskeletal: He exhibits no edema and no tenderness.  Lymphadenopathy:    He has no cervical adenopathy.  Neurological: He is alert and oriented to person, place, and time. He has normal reflexes. He exhibits normal muscle tone. Coordination normal.  Skin: Skin is warm and dry. He is not diaphoretic. No erythema. No pallor.  Psychiatric: He has a normal mood and affect. His behavior is normal. Judgment and thought content normal.          Assessment & Plan:  HIV DISEASE Perfect control  Smoker He is smoking less than pack per month. Will continue to try to stop  Hypogonadism male Recheck his testosterone today  DISORDER, DEPRESSIVE NEC Contracted for safety, doesn't want SSRI

## 2010-12-23 NOTE — Patient Instructions (Addendum)
I would recommend milk of magnesia for the constipation  I would also consider docusate 100mg  two tablets at bedtime  Come in fasting for your labs in 4 months

## 2011-01-11 ENCOUNTER — Other Ambulatory Visit: Payer: Self-pay

## 2011-01-11 ENCOUNTER — Encounter (HOSPITAL_COMMUNITY): Payer: Self-pay | Admitting: *Deleted

## 2011-01-11 ENCOUNTER — Emergency Department (HOSPITAL_COMMUNITY)
Admission: EM | Admit: 2011-01-11 | Discharge: 2011-01-11 | Disposition: A | Discharge status: Home / Self Care | Payer: Medicare Other | Attending: Emergency Medicine | Admitting: Emergency Medicine

## 2011-01-11 DIAGNOSIS — Z21 Asymptomatic human immunodeficiency virus [HIV] infection status: Secondary | ICD-10-CM | POA: Insufficient documentation

## 2011-01-11 DIAGNOSIS — F17200 Nicotine dependence, unspecified, uncomplicated: Secondary | ICD-10-CM | POA: Insufficient documentation

## 2011-01-11 DIAGNOSIS — R109 Unspecified abdominal pain: Secondary | ICD-10-CM | POA: Insufficient documentation

## 2011-01-11 DIAGNOSIS — K625 Hemorrhage of anus and rectum: Secondary | ICD-10-CM

## 2011-01-11 DIAGNOSIS — R079 Chest pain, unspecified: Secondary | ICD-10-CM | POA: Insufficient documentation

## 2011-01-11 DIAGNOSIS — M545 Low back pain, unspecified: Secondary | ICD-10-CM | POA: Insufficient documentation

## 2011-01-11 DIAGNOSIS — R42 Dizziness and giddiness: Secondary | ICD-10-CM | POA: Insufficient documentation

## 2011-01-11 LAB — COMPREHENSIVE METABOLIC PANEL
ALT: 59 U/L — ABNORMAL HIGH (ref 0–53)
Alkaline Phosphatase: 89 U/L (ref 39–117)
BUN: 11 mg/dL (ref 6–23)
CO2: 26 mEq/L (ref 19–32)
GFR calc Af Amer: >90 mL/min (ref >90)
GFR calc non Af Amer: >90 mL/min (ref >90)
Glucose, Bld: 93 mg/dL (ref 70–99)
Potassium: 3.9 mEq/L (ref 3.5–5.1)
Total Bilirubin: 0.2 mg/dL — ABNORMAL LOW (ref 0.3–1.2)
Total Protein: 7.7 g/dL (ref 6.0–8.3)

## 2011-01-11 LAB — CBC
HCT: 43.1 % (ref 39.0–52.0)
MCH: 31.1 pg (ref 26.0–34.0)
MCHC: 34.3 g/dL (ref 30.0–36.0)
MCV: 90.5 fL (ref 78.0–100.0)
RDW: 13.5 % (ref 11.5–15.5)

## 2011-01-11 LAB — POCT I-STAT, CHEM 8
Calcium, Ion: 1.06 mmol/L — ABNORMAL LOW (ref 1.12–1.32)
Chloride: 104 mEq/L (ref 96–112)
Glucose, Bld: 97 mg/dL (ref 70–99)
HCT: 47 % (ref 39.0–52.0)
Hemoglobin: 16 g/dL (ref 13.0–17.0)
TCO2: 28 mmol/L (ref 0–100)

## 2011-01-11 LAB — PROTIME-INR: Prothrombin Time: 12.5 seconds (ref 11.6–15.2)

## 2011-01-11 MED ORDER — HYDROCODONE-ACETAMINOPHEN 5-325 MG PO TABS
2.0000 | ORAL_TABLET | Freq: Once | ORAL | Status: AC
  Administered 2011-01-11: 2 via ORAL
  Filled 2011-01-11: qty 2

## 2011-01-11 MED ORDER — HYDROCODONE-ACETAMINOPHEN 5-500 MG PO TABS: 1.0000 | ORAL_TABLET | Freq: Four times a day (QID) | ORAL | Status: DC | PRN

## 2011-01-11 NOTE — ED Notes (Signed)
Pt is here with chest tightness and lower abd and kidney area pain.  Pt reports some rectal bleeding.

## 2011-01-11 NOTE — ED Provider Notes (Addendum)
History     CSN: [id number] Arrival date & time: 01/11/2011 12:29 PM   First MD Initiated Contact with Patient 01/11/11 1535      No chief complaint on file.  Chief complainT: Chest pain back pain rectal bleeding (Consider location/radiation/quality/duration/timing/severity/associated sxs/prior treatment) HPI Complains of chest pain for 3 years improved with drinking water area nonexertional pain is anterior nonradiating burning in quality lasts for approximately 3 hours at a time. Patient presents today as he's had low bowel pain and blood in his stool for the past 1-2 months. Also of some low back pain. Treated with Tylenol, no relief. No other associated symptoms no fever no weight loss. No pain presently. Feels mildly lightheaded at present. History reviewed. No pertinent past medical history. HIV positive History reviewed. No pertinent past surgical history.  No family history on file.  History  Substance Use Topics  . Smoking status: Current Everyday Smoker -- 1.0 packs/day    Types: Cigarettes  . Smokeless tobacco: Not on file  . Alcohol Use: Yes     occ   Former drug user last used crack cocaine and marijuana in the 80s   Review of Systems  Constitutional: Negative.        Dizzy  HENT: Negative.   Respiratory: Negative.   Cardiovascular: Positive for chest pain.  Gastrointestinal: Positive for abdominal pain.       Rectal bleed  Musculoskeletal: Positive for back pain.  Skin: Negative.   Neurological: Negative.   Hematological: Negative.   Psychiatric/Behavioral: Negative.   All other systems reviewed and are negative.    Allergies  Alka-seltzer and Aspirin-acetaminophen-caffeine  Home Medications   Current Outpatient Rx  Name Route Sig Dispense Refill  . EFAVIRENZ-EMTRICITAB-TENOFOVIR 600-200-300 MG PO TABS Oral Take 1 tablet by mouth at bedtime.      . TESTOSTERONE 50 MG/5GM TD GEL Transdermal Place 5 g onto the skin daily.        BP 112/67   Pulse 84  Temp(Src) 98.8 F (37.1 C) (Oral)  Resp 20  SpO2 99%  Physical Exam  Nursing note and vitals reviewed. Constitutional: He appears well-developed and well-nourished.  HENT:  Head: Normocephalic and atraumatic.  Eyes: Conjunctivae are normal. Pupils are equal, round, and reactive to light.  Neck: Neck supple. No tracheal deviation present. No thyromegaly present.  Cardiovascular: Normal rate and regular rhythm.   No murmur heard. Pulmonary/Chest: Effort normal and breath sounds normal.  Abdominal: Soft. Bowel sounds are normal. He exhibits no distension. There is no tenderness.  Genitourinary: Penis normal. Guaiac positive stool.       Rectum normal tone brown stool trace heme positive  Musculoskeletal: Normal range of motion. He exhibits no edema and no tenderness.  Neurological: He is alert. Coordination normal.  Skin: Skin is warm and dry. No rash noted.  Psychiatric: He has a normal mood and affect.    ED Course  Procedures (including critical care time)  Labs Reviewed - No data to display No results found.  Date: 01/11/2011  Rate: 90  Rhythm: normal sinus rhythm  QRS Axis: normal  Intervals: normal  ST/T Wave abnormalities: normal  Conduction Disutrbances:none  Narrative Interpretation:   Old EKG Reviewed: changes noted Leftward intraventricular conduction delay from 11/24/2009 has resolved  No diagnosis found. ED course: 1840 p.m. patient requested pain medicine for low abdominal pain and low back pain hydrocodone-A. Pap ordered 1000 hrs. patient resting comfortably alert Glasgow Coma Score 15    MDM  I did not  feel the chest pain represents acute coronary syndrome i.e. pain is improved with drinking water, nonexertional. Case discussed with Dr. Melvia Heaps  Plan patient to take Prilosec OTC Prescription hydrocodone-A. Pap Patient to call Dr. Arlyce Dice for next available appointment  Diagnosis #1 chest pain #2 abdominal pain #3 rectal bleeding Doug Sou, MD 01/11/11 1901  Doug Sou, MD 01/11/11 1904

## 2011-01-13 ENCOUNTER — Other Ambulatory Visit: Payer: Self-pay | Admitting: Licensed Clinical Social Worker

## 2011-01-14 ENCOUNTER — Other Ambulatory Visit: Payer: Self-pay | Admitting: *Deleted

## 2011-01-14 MED ORDER — HYDROCODONE-ACETAMINOPHEN 5-500 MG PO TABS: 1.0000 | ORAL_TABLET | Freq: Four times a day (QID) | ORAL | Status: AC | PRN

## 2011-01-14 NOTE — Telephone Encounter (Signed)
C/o lower back pain & "pancreas pain. Has appt with GI 02/04/11. md approved  Refill. Gave enough to make it to the appt. States he takes 2 a day. Has tried otc tylenol ES w/o relief

## 2011-01-14 NOTE — Telephone Encounter (Signed)
That is fine to give him some more narcotics. How many is he needing to control his pain in a day and this is for lower back pain?

## 2011-01-14 NOTE — Telephone Encounter (Signed)
States he went to ED & now has appt with GI in December. States the 15 pills they gave him are almost out.  Would like a refill. Told him I wil;l ask md tomorrow & let him know

## 2011-01-15 NOTE — Telephone Encounter (Signed)
Sounds good Thanks Kennon Rounds

## 2011-02-04 ENCOUNTER — Ambulatory Visit: Payer: Medicare Other | Admitting: Gastroenterology

## 2011-02-13 ENCOUNTER — Encounter: Payer: Self-pay | Admitting: Internal Medicine

## 2011-02-21 ENCOUNTER — Encounter: Payer: Self-pay | Admitting: Internal Medicine

## 2011-02-21 ENCOUNTER — Ambulatory Visit (INDEPENDENT_AMBULATORY_CARE_PROVIDER_SITE_OTHER): Payer: Medicare Other | Admitting: Internal Medicine

## 2011-02-21 DIAGNOSIS — R1030 Lower abdominal pain, unspecified: Secondary | ICD-10-CM

## 2011-02-21 DIAGNOSIS — R52 Pain, unspecified: Secondary | ICD-10-CM

## 2011-02-21 DIAGNOSIS — R109 Unspecified abdominal pain: Secondary | ICD-10-CM

## 2011-02-21 DIAGNOSIS — K625 Hemorrhage of anus and rectum: Secondary | ICD-10-CM

## 2011-02-21 DIAGNOSIS — M549 Dorsalgia, unspecified: Secondary | ICD-10-CM

## 2011-02-21 DIAGNOSIS — Z1211 Encounter for screening for malignant neoplasm of colon: Secondary | ICD-10-CM

## 2011-02-21 MED ORDER — TRAMADOL HCL 50 MG PO TABS: 50.0000 mg | ORAL_TABLET | Freq: Three times a day (TID) | ORAL | Status: AC | PRN

## 2011-02-21 MED ORDER — PEG-KCL-NACL-NASULF-NA ASC-C 100 G PO SOLR: 1.0000 | Freq: Once | ORAL | Status: DC

## 2011-02-21 NOTE — Progress Notes (Signed)
Subjective:    Patient ID: Dean Carlson, male    DOB: 1960-07-17, 50 y.o.   MRN: [id number]  HPI Dean Carlson is a 50 yo male with PMH of HIV currently on antiviral therapy who seen in consultation at the request of Dr. Daiva Eves for evaluation of lower abdominal pain and intermittent rectal bleeding. The patient reports 4-6 weeks of lower back pain which he reports radiates around/through to his lower abdomen. This pain is worse when he is sitting or moving around. It is specifically better when he is lying down. It is also worse at the end of the day. He reports this as a pressure in his lower back and at times a sharp pain. He feels that it may slightly improve with bowel movement, but this improvement is transient. He has been using Vicodin for this pain with some relief. He states that he was having intermittent rectal bleeding, but this has stopped since he discontinued use of AndroGel. He reports this occurred on several occasions and was bright red blood on his stool. He denied pain associated with defecation. He denies diarrhea or constipation. Now he reports formed, brown stools. Appetite is good, weight is stable. No nausea or vomiting. No heartburn. No fevers or chills.  Regarding his back pain, he is not specifically remember trauma, but feels it started after masturbating.  Review of Systems Constitutional: Negative for fever, chills, night sweats, activity change, appetite change and unexpected weight change HEENT: Negative for sore throat, mouth sores and trouble swallowing. Eyes: Negative for visual disturbance Respiratory: Negative for cough, chest tightness and shortness of breath Cardiovascular: Negative for chest pain, palpitations and lower extremity swelling Gastrointestinal: See history of present illness Genitourinary: Negative for dysuria and hematuria. Musculoskeletal: See HPI, no other arthralgias and myalgias Skin: Negative for rash or color change Neurological: Negative  for headaches, weakness, numbness Hematological: Negative for adenopathy, negative for easy bruising/bleeding Psychiatric/behavioral: Negative for depressed mood, negative for anxiety  Patient Active Problem List  Diagnoses  . HIV DISEASE  . HSV  . PNEUMOCYSTIS PNEUMONIA  . HYPERLIPIDEMIA  . MOOD DISORDER IN CONDITIONS CLASSIFIED ELSEWHERE  . ERECTILE DYSFUNCTION  . DISORDER, DEPRESSIVE NEC  . INSOMNIA UNSPECIFIED  . SKIN RASH  . CHEST PAIN, NON-CARDIAC  . HYPERGLYCEMIA  . Screening for STD (sexually transmitted disease)  . Hypogonadism male  . Smoker   Current Outpatient Prescriptions  Medication Sig Dispense Refill  . efavirenz-emtrictabine-tenofovir (ATRIPLA) 600-200-300 MG per tablet Take 1 tablet by mouth at bedtime.        . peg 3350 powder (MOVIPREP) 100 G SOLR Take 1 kit (100 g total) by mouth once.  1 kit  0  . traMADol (ULTRAM) 50 MG tablet Take 1 tablet (50 mg total) by mouth 3 (three) times daily as needed for pain. Maximum dose= 8 tablets per day  30 tablet  0   Allergies  Allergen Reactions  . Alka-Seltzer (Aspirin Effervescent) Other (See Comments)    asthma  . Aspirin-Acetaminophen-Caffeine     REACTION: asthma   Family History  Problem Relation Age of Onset  . Diabetes     Social History  . Marital Status: Legally Separated   Occupational History  . permanant disability    Social History Main Topics  . Smoking status: Former Smoker -- 1.0 packs/day    Types: Cigarettes  . Smokeless tobacco: Never Used  . Alcohol Use: No     occ  . Drug Use: No  Objective:   Physical Exam BP 102/82  Pulse 80  Ht 5\' 10"  (1.778 m)  Wt 192 lb (87.091 kg)  BMI 27.55 kg/m2 Constitutional: Well-developed and well-nourished. No distress. HEENT: Normocephalic and atraumatic. Oropharynx is clear and moist. No oropharyngeal exudate. Conjunctivae are normal. Pupils are equal round and reactive to light. No scleral icterus. Neck: Neck supple. Trachea  midline. Cardiovascular: Normal rate, regular rhythm and intact distal pulses. No M/R/G Pulmonary/chest: Effort normal and breath sounds normal. No wheezing, rales or rhonchi. Abdominal: Soft, nontender, nondistended. Bowel sounds active throughout. There are no masses palpable. No hepatosplenomegaly. Extremities: no clubbing, cyanosis, or edema Back: No CVA tenderness, some mild tenderness to palpation across the lower back and paraspinous muscles in the L4/L5 region, no point tenderness over the vertebrae Lymphadenopathy: No cervical adenopathy noted. Neurological: Alert and oriented to person place and time. Skin: Skin is warm and dry. No rashes noted. Psychiatric: Normal mood and affect. Behavior is normal.  CBC    Component Value Date/Time   WBC 6.9 01/11/2011 1630   RBC 4.76 01/11/2011 1630   HGB 16.0 01/11/2011 1638   HCT 47.0 01/11/2011 1638   PLT 175 01/11/2011 1630   MCV 90.5 01/11/2011 1630   MCH 31.1 01/11/2011 1630   MCHC 34.3 01/11/2011 1630   RDW 13.5 01/11/2011 1630   LYMPHSABS 2.1 12/09/2010 0918   MONOABS 0.8 12/09/2010 0918   EOSABS 0.1 12/09/2010 0918   BASOSABS 0.0 12/09/2010 0918       Assessment & Plan:    50 yo male with PMH of HIV currently on antiviral therapy who seen in consultation at the request of Dr. Daiva Eves for evaluation of lower abdominal pain and intermittent rectal bleeding  1. Rectal bleeding/abd pain -- the patient has never had colorectal cancer screening, and given his intermittent rectal bleeding I have recommended colonoscopy for him. It seems his bleeding has now completely resolved. We discussed the risk and benefits of this test and he is willing to proceed. Regarding the patient's lower abdominal pain, I'm not certain that this is related to his GI tract. The pain seems more consistent with degenerative disc disease/lower back pain which radiates forward. This pain is worse with movement and the patient identifies this first as back pain. We  will proceed with colonoscopy, and if the pain continues and the findings are found to explain his symptoms, then we will consider imaging.  Labs are reviewed from November, CBC was normal which is reassuring.  2. Back pain -- see #1.  I will prescribe tramadol 50 mg 3 times a day when necessary back pain. If not significantly improved after 5-7 days he can increase to 100 mg 3 times a day when necessary for back pain.

## 2011-02-21 NOTE — Patient Instructions (Signed)
You have been scheduled for a colonoscopy. Please follow written instructions given to you at your visit today.  Please pick up your prep kit at the pharmacy within the next 2-3 days.   We have sent the following medications to your pharmacy for you to pick up at your convenience.

## 2011-03-12 ENCOUNTER — Other Ambulatory Visit: Payer: Self-pay | Admitting: *Deleted

## 2011-03-12 DIAGNOSIS — M549 Dorsalgia, unspecified: Secondary | ICD-10-CM

## 2011-03-12 MED ORDER — HYDROCODONE-ACETAMINOPHEN 5-500 MG PO TABS: 1.0000 | ORAL_TABLET | Freq: Four times a day (QID) | ORAL | Status: DC | PRN

## 2011-03-12 NOTE — Telephone Encounter (Signed)
RN checked with Dr. Daiva Eves.  Dr. Daiva Eves [facility] refill.  Pt has a f/u appt in Feb. 2013.  Refill phoned to pharmacy.

## 2011-03-13 ENCOUNTER — Encounter: Payer: Medicare Other | Admitting: Internal Medicine

## 2011-03-19 ENCOUNTER — Emergency Department (HOSPITAL_COMMUNITY)
Admission: EM | Admit: 2011-03-19 | Discharge: 2011-03-20 | Disposition: A | Discharge status: Home / Self Care | Payer: Medicare Other | Attending: Emergency Medicine | Admitting: Emergency Medicine

## 2011-03-19 ENCOUNTER — Encounter (HOSPITAL_COMMUNITY): Payer: Self-pay | Admitting: Emergency Medicine

## 2011-03-19 DIAGNOSIS — R300 Dysuria: Secondary | ICD-10-CM | POA: Diagnosis not present

## 2011-03-19 DIAGNOSIS — K625 Hemorrhage of anus and rectum: Secondary | ICD-10-CM | POA: Diagnosis not present

## 2011-03-19 DIAGNOSIS — R1030 Lower abdominal pain, unspecified: Secondary | ICD-10-CM

## 2011-03-19 DIAGNOSIS — J45909 Unspecified asthma, uncomplicated: Secondary | ICD-10-CM | POA: Diagnosis not present

## 2011-03-19 DIAGNOSIS — R1032 Left lower quadrant pain: Secondary | ICD-10-CM | POA: Diagnosis not present

## 2011-03-19 DIAGNOSIS — K5730 Diverticulosis of large intestine without perforation or abscess without bleeding: Secondary | ICD-10-CM | POA: Diagnosis not present

## 2011-03-19 DIAGNOSIS — Z21 Asymptomatic human immunodeficiency virus [HIV] infection status: Secondary | ICD-10-CM | POA: Insufficient documentation

## 2011-03-19 DIAGNOSIS — R109 Unspecified abdominal pain: Secondary | ICD-10-CM | POA: Insufficient documentation

## 2011-03-19 LAB — POCT I-STAT, CHEM 8
Creatinine, Ser: 0.8 mg/dL (ref 0.50–1.35)
Glucose, Bld: 92 mg/dL (ref 70–99)
Hemoglobin: 17 g/dL (ref 13.0–17.0)
Sodium: 143 mEq/L (ref 135–145)
TCO2: 27 mmol/L (ref 0–100)

## 2011-03-19 LAB — CBC
MCH: 31.3 pg (ref 26.0–34.0)
MCV: 88.6 fL (ref 78.0–100.0)
Platelets: 175 10*3/uL (ref 150–400)
RBC: 5.02 MIL/uL (ref 4.22–5.81)

## 2011-03-19 LAB — DIFFERENTIAL
Eosinophils Absolute: 0.1 10*3/uL (ref 0.0–0.7)
Eosinophils Relative: 1 % (ref 0–5)
Lymphs Abs: 2.3 10*3/uL (ref 0.7–4.0)

## 2011-03-19 MED ORDER — MORPHINE SULFATE 4 MG/ML IJ SOLN
4.0000 mg | Freq: Once | INTRAMUSCULAR | Status: AC
  Administered 2011-03-20: 4 mg via INTRAVENOUS
  Filled 2011-03-19: qty 1

## 2011-03-19 NOTE — ED Notes (Signed)
Pt to ED via [facility].  C/o lower abd pain x 2 months.  Denies burning with urination but states "it doesn't feel right" when urinating.  Pain also in lower back. Scheduled for colonoscopy 2/12- intermittent bleeding with bowel movements.

## 2011-03-20 ENCOUNTER — Emergency Department (HOSPITAL_COMMUNITY): Payer: Medicare Other

## 2011-03-20 DIAGNOSIS — R1032 Left lower quadrant pain: Secondary | ICD-10-CM | POA: Diagnosis not present

## 2011-03-20 DIAGNOSIS — K5730 Diverticulosis of large intestine without perforation or abscess without bleeding: Secondary | ICD-10-CM | POA: Diagnosis not present

## 2011-03-20 LAB — URINALYSIS, ROUTINE W REFLEX MICROSCOPIC
Bilirubin Urine: NEGATIVE
Hgb urine dipstick: NEGATIVE
Ketones, ur: 15 mg/dL — AB
Protein, ur: NEGATIVE mg/dL
Urobilinogen, UA: 0.2 mg/dL (ref 0.0–1.0)

## 2011-03-20 MED ORDER — IOHEXOL 300 MG/ML  SOLN: 20.0000 mL | INTRAMUSCULAR | Status: AC

## 2011-03-20 MED ORDER — HYDROCODONE-ACETAMINOPHEN 5-500 MG PO TABS: 2.0000 | ORAL_TABLET | Freq: Four times a day (QID) | ORAL | Status: DC | PRN

## 2011-03-20 MED ORDER — IOHEXOL 300 MG/ML  SOLN
100.0000 mL | Freq: Once | INTRAMUSCULAR | Status: AC | PRN
  Administered 2011-03-20: 100 mL via INTRAVENOUS

## 2011-03-20 NOTE — ED Notes (Signed)
Received pt after report, pt ambulated to restroom without difficulty. Pt given a gown and informed a rectal exam will be performed. Pt denies any needs at this time

## 2011-03-20 NOTE — ED Notes (Signed)
Pt c/o ongoing abdominal pain x 2 months.  States it increases with movement, especially standing.  Denies n/v, normal BM.

## 2011-03-20 NOTE — ED Provider Notes (Signed)
History     CSN: [id number]  Arrival date & time 03/19/11  1913   First MD Initiated Contact with Patient 03/19/11 2256      Chief Complaint  Patient presents with  . Abdominal Pain    (Consider location/radiation/quality/duration/timing/severity/associated sxs/prior treatment) Patient is a 51 y.o. male presenting with abdominal pain. The history is provided by the patient. No language interpreter was used.  Abdominal Pain The primary symptoms of the illness include abdominal pain. The primary symptoms of the illness do not include fever, fatigue, shortness of breath, nausea, vomiting, diarrhea, hematemesis or vaginal bleeding. Primary symptoms comment: blood on toilet paper occasionally Episode onset: 3-4 months ago. The onset of the illness was gradual. The problem has not changed since onset. Associated with: none. The patient has not had a change in bowel habit. Risk factors for an acute abdominal problem include immunodeficiency. Symptoms associated with the illness do not include chills, anorexia, diaphoresis, heartburn, constipation, urgency, hematuria, frequency or back pain. Associated symptoms comments: Uncomfortable when urinates. Significant associated medical issues include HIV.    Past Medical History  Diagnosis Date  . Asthma   . HIV (human immunodeficiency virus infection)     Past Surgical History  Procedure Date  . None     Family History  Problem Relation Age of Onset  . Diabetes      History  Substance Use Topics  . Smoking status: Former Smoker -- 1.0 packs/day    Types: Cigarettes  . Smokeless tobacco: Never Used  . Alcohol Use: No     occ      Review of Systems  Constitutional: Negative for fever, chills, diaphoresis and fatigue.  HENT: Negative.   Eyes: Negative.   Respiratory: Negative for shortness of breath.   Gastrointestinal: Positive for abdominal pain and anal bleeding. Negative for heartburn, nausea, vomiting, diarrhea,  constipation, anorexia and hematemesis.  Genitourinary: Negative for urgency, frequency, hematuria and vaginal bleeding.  Musculoskeletal: Negative for back pain.  Neurological: Negative.   Hematological: Negative.   Psychiatric/Behavioral: Negative.     Allergies  Alka-seltzer and Aspirin-acetaminophen-caffeine  Home Medications   Current Outpatient Rx  Name Route Sig Dispense Refill  . EFAVIRENZ-EMTRICITAB-TENOFOVIR 600-200-300 MG PO TABS Oral Take 1 tablet by mouth at bedtime.      Marland Kitchen HYDROCODONE-ACETAMINOPHEN 5-500 MG PO TABS Oral Take 1 tablet by mouth every 6 (six) hours as needed. For pain      BP 121/72  Pulse 76  Temp(Src) 99.1 F (37.3 C) (Oral)  Resp 20  SpO2 99%  Physical Exam  Constitutional: He is oriented to person, place, and time. He appears well-developed and well-nourished. No distress.  HENT:  Head: Normocephalic and atraumatic.  Mouth/Throat: Oropharynx is clear and moist.  Eyes: Conjunctivae are normal. Pupils are equal, round, and reactive to light.  Neck: Normal range of motion. Neck supple.  Cardiovascular: Normal rate and regular rhythm.   Pulmonary/Chest: Effort normal and breath sounds normal.  Abdominal: Soft. Bowel sounds are normal. He exhibits no mass. There is no rebound and no guarding.  Genitourinary: Guaiac negative stool.  Musculoskeletal: Normal range of motion. He exhibits no edema.  Neurological: He is alert and oriented to person, place, and time.  Skin: Skin is warm and dry. He is not diaphoretic.  Psychiatric: Thought content normal.    ED Course  Procedures (including critical care time)  Labs Reviewed  DIFFERENTIAL - Abnormal; Notable for the following:    Neutrophils Relative 42 (*)  Monocytes Relative 14 (*)    All other components within normal limits  URINALYSIS, ROUTINE W REFLEX MICROSCOPIC - Abnormal; Notable for the following:    Ketones, ur 15 (*)    All other components within normal limits  CBC  POCT  I-STAT, CHEM 8  OCCULT BLOOD, POC DEVICE  I-STAT, CHEM 8  URINALYSIS, ROUTINE W REFLEX MICROSCOPIC  URINE CULTURE  POCT OCCULT BLOOD STOOL, DEVICE   Ct Abdomen Pelvis W Contrast  03/20/2011  *RADIOLOGY REPORT*  Clinical Data: Left lower quadrant abdominal pain.  History of HIV.  CT ABDOMEN AND PELVIS WITH CONTRAST  Technique:  Multidetector CT imaging of the abdomen and pelvis was performed following the standard protocol during bolus administration of intravenous contrast.  Contrast: OMNIPAQUE IOHEXOL 300 MG/ML IV SOLN  Comparison: None.  Findings: Minimal bibasilar atelectasis is noted.  The liver and spleen are unremarkable in appearance.  The gallbladder is within normal limits.  The pancreas and adrenal glands are unremarkable.  The kidneys are unremarkable in appearance.  There is no evidence of hydronephrosis.  No renal or ureteral stones are seen.  No perinephric stranding is appreciated.  No free fluid is identified.  The small bowel is unremarkable in appearance.  The stomach is within normal limits.  No acute vascular abnormalities are seen.  The appendix is normal in caliber, without evidence for appendicitis.  Minimal diverticulosis is noted along the proximal sigmoid colon, without evidence of diverticulitis.  The bladder is mildly distended; minimal fat infiltration is noted along the anterior wall of the bladder.  The prostate is borderline normal in size.  No inguinal lymphadenopathy is seen.  No acute osseous abnormalities are identified.  IMPRESSION:  1.  No acute abnormalities seen within the abdomen or pelvis. 2.  Minimal diverticulosis noted along the proximal sigmoid colon.  Original Report Authenticated By: Tonia Ghent, M.D.     No diagnosis found.    MDM  Patient has appointment with GI, would recommend keeping the appointment using stool softeners and following up with Dr. Daiva Eves Patient verbalizes understanding and agrees to follow up        Artie Takayama Smitty Cords, MD 03/20/11 [phone]

## 2011-03-24 ENCOUNTER — Telehealth: Payer: Self-pay | Admitting: *Deleted

## 2011-03-24 NOTE — Telephone Encounter (Signed)
States he went to the ED last week & was told he had an internal hemorrhoid. C/o pain still in LLQ. He was given a rx for pain meds but has not gotten it filled "I don't want to get addicted". He has an appt at GI in Feb & is having a colonoscopy. I urged him to make sure he keeps this appt. Reviewed diet & exercise. He does not eat vegetables & sits or lays around most of the time. Advised intake of fruits & vegetables & more activity.  He had c/o constipation & slight bleeding when he stools. Explained the bleeding may be from slight tears in the skin when passing large stools.  To let us know if worse or more than usual.Told him diet changes & exercise will help this. Also told him to get miralax or similar product. He had multiple questions about his hemorrhoid. advised speaking with his GI doctor  To call if pain is worse, fever or other symptoms get worse.he verbalized understanding

## 2011-03-31 ENCOUNTER — Telehealth: Payer: Self-pay | Admitting: *Deleted

## 2011-03-31 NOTE — Telephone Encounter (Signed)
Refill request from pharmacy for Vicodin, denied too early.  Last filled 03/12/11. Wendall Mola CMA

## 2011-04-02 ENCOUNTER — Other Ambulatory Visit: Payer: Self-pay | Admitting: *Deleted

## 2011-04-02 NOTE — Telephone Encounter (Signed)
States he is in a lot of pain & out of meds. Has GI appt on 12th. Asked that md please give more meds until GI sees him. I told him per last note, he was too early. I told him I will ask md when I see him & let pt know the response

## 2011-04-07 ENCOUNTER — Telehealth: Payer: Self-pay | Admitting: *Deleted

## 2011-04-07 NOTE — Telephone Encounter (Signed)
Patient called and advised that he still has blood in his stool. He says that it has been there for about 3 months. He recently had a visit to the ED 2 weeks ago because of new pain in his stomach and back. He has an appointment with GI for 04/15/11 and needs something for pain until then. He says that he had the pharmacist call this morning to fill a pain Rx and was told no and just wanted the provider to know what he needs. Told him I will let the provider know but that in the mean time he may want to call GI to see if he could get in earlier since he is in pain and the blood is still present. I also advised him not to take anything OTC with Asprin as it may increase his bleeding. I also told him I would let the doctor know and that if he had any advice or felt he needed to see him before his 04/23/11 visit we would contact him. (He has a blood draw on 04/09/11.)

## 2011-04-08 NOTE — Telephone Encounter (Signed)
When did we last give mr Munter narcotics?

## 2011-04-09 ENCOUNTER — Other Ambulatory Visit: Payer: Medicare Other

## 2011-04-09 DIAGNOSIS — Z113 Encounter for screening for infections with a predominantly sexual mode of transmission: Secondary | ICD-10-CM

## 2011-04-09 DIAGNOSIS — E785 Hyperlipidemia, unspecified: Secondary | ICD-10-CM

## 2011-04-09 DIAGNOSIS — B20 Human immunodeficiency virus [HIV] disease: Secondary | ICD-10-CM

## 2011-04-09 DIAGNOSIS — Z20828 Contact with and (suspected) exposure to other viral communicable diseases: Secondary | ICD-10-CM | POA: Diagnosis not present

## 2011-04-09 LAB — CBC WITH DIFFERENTIAL/PLATELET
Eosinophils Absolute: 0.1 10*3/uL (ref 0.0–0.7)
Eosinophils Relative: 2 % (ref 0–5)
Hemoglobin: 15.7 g/dL (ref 13.0–17.0)
Lymphs Abs: 2 10*3/uL (ref 0.7–4.0)
MCH: 30.9 pg (ref 26.0–34.0)
MCV: 90.9 fL (ref 78.0–100.0)
Monocytes Relative: 12 % (ref 3–12)
RBC: 5.08 MIL/uL (ref 4.22–5.81)

## 2011-04-09 LAB — COMPLETE METABOLIC PANEL WITH GFR
BUN: 9 mg/dL (ref 6–23)
CO2: 28 mEq/L (ref 19–32)
Creat: 0.89 mg/dL (ref 0.50–1.35)
GFR, Est African American: >89 mL/min
GFR, Est Non African American: >89 mL/min
Glucose, Bld: 100 mg/dL — ABNORMAL HIGH (ref 70–99)
Total Bilirubin: 0.3 mg/dL (ref 0.3–1.2)

## 2011-04-09 LAB — RPR

## 2011-04-09 LAB — LIPID PANEL: Cholesterol: 214 mg/dL — ABNORMAL HIGH (ref 0–200)

## 2011-04-10 LAB — T-HELPER CELL (CD4) - (RCID CLINIC ONLY)
CD4 % Helper T Cell: 25 % — ABNORMAL LOW (ref 33–55)
CD4 T Cell Abs: 490 uL (ref 400–2700)

## 2011-04-10 LAB — GC/CHLAMYDIA PROBE AMP, URINE: Chlamydia, Swab/Urine, PCR: NEGATIVE

## 2011-04-11 LAB — HIV-1 RNA QUANT-NO REFLEX-BLD
HIV 1 RNA Quant: <20 copies/mL (ref <20)
HIV-1 RNA Quant, Log: <1.3 {Log} (ref <1.30)

## 2011-04-14 ENCOUNTER — Other Ambulatory Visit: Payer: Self-pay | Admitting: *Deleted

## 2011-04-14 ENCOUNTER — Telehealth: Payer: Self-pay | Admitting: Gastroenterology

## 2011-04-14 DIAGNOSIS — R52 Pain, unspecified: Secondary | ICD-10-CM

## 2011-04-14 MED ORDER — HYDROCODONE-ACETAMINOPHEN 5-500 MG PO TABS: 2.0000 | ORAL_TABLET | Freq: Four times a day (QID) | ORAL | Status: DC | PRN

## 2011-04-14 NOTE — Telephone Encounter (Signed)
Pt called back and said he needs to keep the appt time he has, because he already paid someone to drive him.

## 2011-04-14 NOTE — Telephone Encounter (Signed)
Called to pharmacist. ok'd by md

## 2011-04-14 NOTE — Telephone Encounter (Signed)
lvm for pt to call me back, Dr. Rhea Belton has requested his appt. For his colonoscopy be moved to 2pm if pt will comply, if not keep as is.

## 2011-04-15 ENCOUNTER — Encounter: Payer: Medicare Other | Admitting: Internal Medicine

## 2011-04-15 ENCOUNTER — Other Ambulatory Visit: Payer: Self-pay | Admitting: Gastroenterology

## 2011-04-15 ENCOUNTER — Other Ambulatory Visit: Payer: Self-pay | Admitting: Internal Medicine

## 2011-04-15 MED ORDER — PEG-KCL-NACL-NASULF-NA ASC-C 100 G PO SOLR: 1.0000 | Freq: Once | ORAL | Status: DC

## 2011-04-21 ENCOUNTER — Telehealth: Payer: Self-pay | Admitting: *Deleted

## 2011-04-21 NOTE — Telephone Encounter (Signed)
States he has been unable to find anyone to take him to the colonoscopy appt. No family or friends will do this even though he offered them money. States he did call the GI office & cancel it. He will call them back when he has a ride.  I gave this info to Sharol Roussel who follows him. She may be able to arrange a ride for him

## 2011-04-23 ENCOUNTER — Encounter: Payer: Self-pay | Admitting: Infectious Disease

## 2011-04-23 ENCOUNTER — Ambulatory Visit (INDEPENDENT_AMBULATORY_CARE_PROVIDER_SITE_OTHER): Payer: Medicare Other | Admitting: Infectious Disease

## 2011-04-23 VITALS — BP 130/82 | HR 88 | Temp 98.2°F | Wt 195.0 lb

## 2011-04-23 DIAGNOSIS — Z23 Encounter for immunization: Secondary | ICD-10-CM | POA: Diagnosis not present

## 2011-04-23 DIAGNOSIS — B20 Human immunodeficiency virus [HIV] disease: Secondary | ICD-10-CM

## 2011-04-23 DIAGNOSIS — R109 Unspecified abdominal pain: Secondary | ICD-10-CM | POA: Insufficient documentation

## 2011-04-23 DIAGNOSIS — E291 Testicular hypofunction: Secondary | ICD-10-CM

## 2011-04-23 NOTE — Assessment & Plan Note (Signed)
Excellent control.   

## 2011-04-23 NOTE — Patient Instructions (Signed)
I want you to slowly get yourself off of the hydrocodone  You can take one a day for a week then one every other day for a week then stop  Take instead ibuprofen three 200mg  every six hours for pain  Take two docusate pills for 200mg  dose daily to soften stools  YOu can also take senna 8.6mg  tablet one to two to help move your bowels and if needed  Take Milk of Magnesia if you are really constipated

## 2011-04-23 NOTE — Assessment & Plan Note (Signed)
I wonder if we are making this worse if this is constipation. Lets get him off the vicodin, rx with NSAIds, see what his colonoscopy shows. ? He be having kidney stones. Will get urine again

## 2011-04-23 NOTE — Progress Notes (Signed)
Subjective:    Patient ID: Dean Carlson, male    DOB: [birth year], 51 y.o.   MRN: [id number]  HPI  Dean Carlson is a 51 y.o. male who is doing superbly well on his antiviral regimen, with undetectable viral load and health cd4 count. He has unfortunately beeen suffering from a combinatinon of lower abdominal pain, rectal pain, pain along his lower back and pain that radiates into his scrotum at times. He has been getting vicodin from [place] to control this pain but I am concerned it may be making things worse if this is in fact due to constipation with hemorrhoids. He is getting colonoscopy in early March. We discussed weaning him from narcotics and using NSAIds instead in the interval. He has stopped his testosterone repletion due to concerns it was causing these ssx but I dont think that is the case.   Review of Systems  Constitutional: Negative for fever, chills, diaphoresis, activity change, appetite change, fatigue and unexpected weight change.  HENT: Negative for congestion, sore throat, rhinorrhea, sneezing, trouble swallowing and sinus pressure.   Eyes: Negative for photophobia and visual disturbance.  Respiratory: Negative for cough, chest tightness, shortness of breath, wheezing and stridor.   Cardiovascular: Negative for chest pain, palpitations and leg swelling.  Gastrointestinal: Positive for abdominal pain and constipation. Negative for nausea, vomiting, diarrhea, blood in stool, abdominal distention and anal bleeding.  Genitourinary: Negative for dysuria, hematuria, flank pain and difficulty urinating.  Musculoskeletal: Negative for myalgias, back pain, joint swelling, arthralgias and gait problem.  Skin: Negative for color change, pallor, rash and wound.  Neurological: Negative for dizziness, tremors, weakness and light-headedness.  Hematological: Negative for adenopathy. Does not bruise/bleed easily.  Psychiatric/Behavioral: Negative for behavioral problems, confusion, sleep  disturbance, dysphoric mood, decreased concentration and agitation.       Objective:   Physical Exam  Constitutional: He is oriented to person, place, and time. He appears well-developed and well-nourished. No distress.  HENT:  Head: Normocephalic and atraumatic.  Mouth/Throat: Oropharynx is clear and moist. No oropharyngeal exudate.  Eyes: Conjunctivae and EOM are normal. Pupils are equal, round, and reactive to light. No scleral icterus.  Neck: Normal range of motion. Neck supple. No JVD present.  Cardiovascular: Normal rate, regular rhythm and normal heart sounds.  Exam reveals no gallop and no friction rub.   No murmur heard. Pulmonary/Chest: Effort normal and breath sounds normal. No respiratory distress. He has no wheezes. He has no rales. He exhibits no tenderness.  Abdominal: He exhibits no distension and no mass. There is no tenderness. There is no rebound and no guarding. Hernia confirmed negative in the right inguinal area and confirmed negative in the left inguinal area.  Genitourinary: Right testis shows no mass, no swelling and no tenderness. Left testis shows no mass, no swelling and no tenderness.  Musculoskeletal: He exhibits no edema and no tenderness.  Lymphadenopathy:    He has no cervical adenopathy.       Right: No inguinal adenopathy present.       Left: No inguinal adenopathy present.  Neurological: He is alert and oriented to person, place, and time. He has normal reflexes. He exhibits normal muscle tone. Coordination normal.  Skin: Skin is warm and dry. He is not diaphoretic. No erythema. No pallor.  Psychiatric: He has a normal mood and affect. His behavior is normal. Judgment and thought content normal.          Assessment & Plan:  HIV DISEASE Excellent control  Hypogonadism male  I think he should restart his testosterone  Abdominal  pain, other specified site I wonder if we are making this worse if this is constipation. Lets get him off the vicodin,  rx with NSAIds, see what his colonoscopy shows. ? He be having kidney stones. Will get urine again

## 2011-04-23 NOTE — Assessment & Plan Note (Signed)
I think he should restart his testosterone

## 2011-04-28 ENCOUNTER — Encounter: Payer: Medicare Other | Admitting: Internal Medicine

## 2011-05-07 ENCOUNTER — Encounter: Payer: Medicare Other | Admitting: Internal Medicine

## 2011-05-07 ENCOUNTER — Encounter (HOSPITAL_COMMUNITY): Payer: Self-pay | Admitting: *Deleted

## 2011-05-13 ENCOUNTER — Ambulatory Visit (HOSPITAL_COMMUNITY): Payer: Medicare Other | Admitting: Anesthesiology

## 2011-05-13 ENCOUNTER — Encounter (HOSPITAL_COMMUNITY): Payer: Self-pay | Admitting: Internal Medicine

## 2011-05-13 ENCOUNTER — Encounter (HOSPITAL_COMMUNITY)
Admission: RE | Disposition: A | Discharge status: Home / Self Care | Payer: Self-pay | Source: Ambulatory Visit | Attending: Internal Medicine

## 2011-05-13 ENCOUNTER — Encounter (HOSPITAL_COMMUNITY): Payer: Self-pay | Admitting: Anesthesiology

## 2011-05-13 ENCOUNTER — Telehealth: Payer: Self-pay | Admitting: *Deleted

## 2011-05-13 ENCOUNTER — Ambulatory Visit (HOSPITAL_COMMUNITY)
Admission: RE | Admit: 2011-05-13 | Discharge: 2011-05-13 | Disposition: A | Discharge status: Home / Self Care | Payer: Medicare Other | Source: Ambulatory Visit | Attending: Internal Medicine | Admitting: Internal Medicine

## 2011-05-13 ENCOUNTER — Encounter (HOSPITAL_COMMUNITY): Payer: Self-pay | Admitting: *Deleted

## 2011-05-13 DIAGNOSIS — R109 Unspecified abdominal pain: Secondary | ICD-10-CM | POA: Diagnosis not present

## 2011-05-13 DIAGNOSIS — E785 Hyperlipidemia, unspecified: Secondary | ICD-10-CM | POA: Insufficient documentation

## 2011-05-13 DIAGNOSIS — Z79899 Other long term (current) drug therapy: Secondary | ICD-10-CM | POA: Diagnosis not present

## 2011-05-13 DIAGNOSIS — B20 Human immunodeficiency virus [HIV] disease: Secondary | ICD-10-CM | POA: Insufficient documentation

## 2011-05-13 DIAGNOSIS — F39 Unspecified mood [affective] disorder: Secondary | ICD-10-CM | POA: Diagnosis not present

## 2011-05-13 DIAGNOSIS — K625 Hemorrhage of anus and rectum: Secondary | ICD-10-CM | POA: Diagnosis not present

## 2011-05-13 DIAGNOSIS — K648 Other hemorrhoids: Secondary | ICD-10-CM | POA: Insufficient documentation

## 2011-05-13 DIAGNOSIS — F17200 Nicotine dependence, unspecified, uncomplicated: Secondary | ICD-10-CM | POA: Diagnosis not present

## 2011-05-13 DIAGNOSIS — K5730 Diverticulosis of large intestine without perforation or abscess without bleeding: Secondary | ICD-10-CM | POA: Insufficient documentation

## 2011-05-13 DIAGNOSIS — G4700 Insomnia, unspecified: Secondary | ICD-10-CM | POA: Insufficient documentation

## 2011-05-13 DIAGNOSIS — Z1211 Encounter for screening for malignant neoplasm of colon: Secondary | ICD-10-CM

## 2011-05-13 HISTORY — PX: COLONOSCOPY: SHX5424

## 2011-05-13 SURGERY — COLONOSCOPY
Anesthesia: Monitor Anesthesia Care

## 2011-05-13 MED ORDER — FENTANYL CITRATE 0.05 MG/ML IJ SOLN
INTRAMUSCULAR | Status: DC | PRN
  Administered 2011-05-13: 100 ug via INTRAVENOUS

## 2011-05-13 MED ORDER — PROPOFOL 10 MG/ML IV EMUL
INTRAVENOUS | Status: DC | PRN
  Administered 2011-05-13: 20 mg via INTRAVENOUS
  Administered 2011-05-13: 10 mg via INTRAVENOUS
  Administered 2011-05-13: 50 mg via INTRAVENOUS

## 2011-05-13 MED ORDER — LACTATED RINGERS IV SOLN
INTRAVENOUS | Status: DC
  Administered 2011-05-13: 1000 mL via INTRAVENOUS

## 2011-05-13 MED ORDER — PROPOFOL 10 MG/ML IV EMUL
INTRAVENOUS | Status: DC | PRN
  Administered 2011-05-13: 75 ug/kg/min via INTRAVENOUS

## 2011-05-13 MED ORDER — MIDAZOLAM HCL 5 MG/5ML IJ SOLN
INTRAMUSCULAR | Status: DC | PRN
  Administered 2011-05-13: 2 mg via INTRAVENOUS

## 2011-05-13 NOTE — H&P (Signed)
HPI  Dean Carlson is a 51 yo male with PMH of HIV currently on antiviral therapy who seen was seen in Dec 2012 at the request of Dr. Daiva Eves for evaluation of lower abdominal pain and intermittent rectal bleeding.  He now presents for colonoscopy.  Less rectal bleeding of late.  Appetite remains good.  Weight stable.  Still with intermittent lower back pain.  Review of Systems  As per HPI, otherwise, negative.  Patient Active Problem List   Diagnoses   .  HIV DISEASE   .  HSV   .  PNEUMOCYSTIS PNEUMONIA   .  HYPERLIPIDEMIA   .  MOOD DISORDER IN CONDITIONS CLASSIFIED ELSEWHERE   .  ERECTILE DYSFUNCTION   .  DISORDER, DEPRESSIVE NEC   .  INSOMNIA UNSPECIFIED   .  SKIN RASH   .  CHEST PAIN, NON-CARDIAC   .  HYPERGLYCEMIA   .  Screening for STD (sexually transmitted disease)   .  Hypogonadism male   .  Smoker    Current Outpatient Prescriptions   Medication  Sig  Dispense  Refill   .  efavirenz-emtrictabine-tenofovir (ATRIPLA) 600-200-300 MG per tablet  Take 1 tablet by mouth at bedtime.     .  peg 3350 powder (MOVIPREP) 100 G SOLR  Take 1 kit (100 g total) by mouth once.  1 kit  0   .  traMADol (ULTRAM) 50 MG tablet  Take 1 tablet (50 mg total) by mouth 3 (three) times daily as needed for pain. Maximum dose= 8 tablets per day  30 tablet  0    Allergies   Allergen  Reactions   .  Alka-Seltzer (Aspirin Effervescent)  Other (See Comments)     asthma   .  Aspirin-Acetaminophen-Caffeine      REACTION: asthma    Family History   Problem  Relation  Age of Onset   .  Diabetes      Social History   .  Marital Status:  Legally Separated    Occupational History   .  permanant disability     Social History Main Topics   .  Smoking status:  Former Smoker -- 1.0 packs/day     Types:  Cigarettes   .  Smokeless tobacco:  Never Used   .  Alcohol Use:  No      occ   .  Drug Use:  No    Objective:   Physical Exam  BP 131/78  Temp(Src) 98.3 F (36.8 C) (Oral)  Resp 21  Ht 5\' 10"   (1.778 m)  Wt 198 lb (89.812 kg)  BMI 28.41 kg/m2  SpO2 100% Gen: awake, alert, NAD HEENT: anicteric, op clear CV: RRR, no mrg Pulm: CTA b/l Abd: soft, NT/ND, +BS throughout Ext: no c/c/e Neuro: nonfocal   Assessment & Plan:   51 yo male with PMH of HIV currently on antiviral therapy who presents for colonoscopy for evaluation of lower abdominal pain and intermittent rectal bleeding   1. Rectal bleeding/abd pain -- the patient has never had colorectal cancer screening, and given his intermittent rectal bleeding I had recommended colonoscopy for him. He presents for this today.  The nature of the procedure, as well as the risks, benefits, and alternatives were carefully and thoroughly reviewed with the patient. Ample time for discussion and questions allowed. The patient understood, was satisfied, and agreed to proceed.

## 2011-05-13 NOTE — Op Note (Signed)
[facility] [address] [place], [place]  [address]  COLONOSCOPY PROCEDURE REPORT  PATIENT:  Dean Carlson, Dean Carlson  MR#:  [id number] BIRTHDATE:  26-Feb-1961, 50 yrs. old  GENDER:  male ENDOSCOPIST:  Carie Caddy. Ladeana Laplant, MD REF. BY:  Leodis Liverpool, M.D. PROCEDURE DATE:  05/13/2011 PROCEDURE:  Colonoscopy 78469 ASA CLASS:  Class II INDICATIONS:  Routine Risk Screening, rectal bleeding MEDICATIONS:   MAC sedation, administered by CRNA, See Anesthesia Report.  DESCRIPTION OF PROCEDURE:   After the risks benefits and alternatives of the procedure were thoroughly explained, informed consent was obtained.  Digital rectal exam was performed and revealed no rectal masses.   The EC-[id number] ([id number]) endoscope was introduced through the anus and advanced to the cecum, which was identified by both the appendix and ileocecal valve, without limitations.  The quality of the prep was good, using MoviPrep. The instrument was then slowly withdrawn as the colon was fully examined. <<PROCEDUREIMAGES>>  FINDINGS:  Moderate sized Internal Hemorrhoids were found.  Mild diverticulosis was found found scattered throughout the colon (right and left).  Otherwise normal colonoscopy without other polyps, masses, vascular ectasias, or inflammatory changes. Retroflexed views in the rectum revealed no other findings other than those already described.  The scope was then withdrawn from the cecum and the procedure completed. COMPLICATIONS:  None  ENDOSCOPIC IMPRESSION: 1) Internal hemorrhoids, likely the source of intermittent rectal bleeding. 2) Mild diverticulosis found scattered throughout the colon 3) Otherwise normal colonoscopy  RECOMMENDATIONS: 1) High fiber diet. 2) You should continue to follow colorectal cancer screening guidelines for "routine risk" patients with a repeat colonoscopy in 10 years. There is no need for FOBT (stool) testing for at least 5 years. 3) Internal hemorrhoids can be  treated medically when necessary.  Carie Caddy. Rhea Belton, MD  CC:  Leodis Liverpool, MD The Patient  n. eSIGNEDCarie Caddy. Tiffinie Caillier at 05/13/2011 11:01 AM  Erenest Rasher, [phone]

## 2011-05-13 NOTE — Transfer of Care (Signed)
Immediate Anesthesia Transfer of Care Note  Patient: Dean Carlson  Procedure(s) Performed: Procedure(s) (LRB): COLONOSCOPY (N/A)  Patient Location: Endoscopy Unit  Anesthesia Type: MAC  Level of Consciousness: awake, alert  and oriented  Airway & Oxygen Therapy: Patient Spontanous Breathing and Patient connected to face mask oxygen  Post-op Assessment: Report given to PACU RN and Post -op Vital signs reviewed and stable  Post vital signs: Reviewed and stable  Complications: No apparent anesthesia complications

## 2011-05-13 NOTE — Telephone Encounter (Signed)
I do not see that he ever had an xray or MRI, just CT of abdomen.  Do you want him to schedule another appt. With you to discuss? Wendall Mola CMA

## 2011-05-13 NOTE — Telephone Encounter (Signed)
Patient had colonoscopy, and said everything was OK, and the GI doctor said we should refer him to a "back doctor", because he is having pain in his back.  He wants to be referred after 06/04/11 so he will be able to take the bus. Wendall Mola CMA

## 2011-05-13 NOTE — Preoperative (Signed)
Beta Blockers   Reason not to administer Beta Blockers:Not Applicable 

## 2011-05-13 NOTE — Anesthesia Postprocedure Evaluation (Signed)
  Anesthesia Post-op Note  Patient: Dean Carlson  Procedure(s) Performed: Procedure(s) (LRB): COLONOSCOPY (N/A)  Patient Location: PACU  Anesthesia Type: MAC  Level of Consciousness: awake and alert   Airway and Oxygen Therapy: Patient Spontanous Breathing  Post-op Pain: mild  Post-op Assessment: Post-op Vital signs reviewed, Patient's Cardiovascular Status Stable, Respiratory Function Stable, Patent Airway and No signs of Nausea or vomiting  Post-op Vital Signs: stable  Complications: No apparent anesthesia complications

## 2011-05-13 NOTE — Telephone Encounter (Signed)
Lets bring him in for appt to do exam

## 2011-05-13 NOTE — Telephone Encounter (Signed)
I dont know that the would do anything for him yet. Do we have films or MRI at all?

## 2011-05-13 NOTE — Anesthesia Preprocedure Evaluation (Signed)
Anesthesia Evaluation  Patient identified by MRN, date of birth, ID band Patient awake    Reviewed: Allergy & Precautions, H&P , NPO status , Patient's Chart, lab work & pertinent test results  Airway Mallampati: II TM Distance: >3 FB Neck ROM: Full    Dental No notable dental hx.    Pulmonary neg pulmonary ROS,  breath sounds clear to auscultation  Pulmonary exam normal       Cardiovascular negative cardio ROS  Rhythm:Regular Rate:Normal     Neuro/Psych Depression negative neurological ROS  negative psych ROS   GI/Hepatic negative GI ROS, Neg liver ROS,   Endo/Other  negative endocrine ROS  Renal/GU negative Renal ROS  negative genitourinary   Musculoskeletal negative musculoskeletal ROS (+)   Abdominal   Peds negative pediatric ROS (+)  Hematology  (+) HIV,   Anesthesia Other Findings   Reproductive/Obstetrics negative OB ROS                           Anesthesia Physical Anesthesia Plan  ASA: III  Anesthesia Plan: MAC   Post-op Pain Management:    Induction: Intravenous  Airway Management Planned: Simple Face Mask  Additional Equipment:   Intra-op Plan:   Post-operative Plan:   Informed Consent: I have reviewed the patients History and Physical, chart, labs and discussed the procedure including the risks, benefits and alternatives for the proposed anesthesia with the patient or authorized representative who has indicated his/her understanding and acceptance.   Dental advisory given  Plan Discussed with: CRNA  Anesthesia Plan Comments:         Anesthesia Quick Evaluation

## 2011-05-13 NOTE — Discharge Instructions (Addendum)
Colonoscopy Care After Read the instructions outlined below and refer to this sheet in the next few weeks. These discharge instructions provide you with general information on caring for yourself after you leave the hospital. Your doctor may also give you specific instructions. While your treatment has been planned according to the most current medical practices available, unavoidable complications occasionally occur. If you have any problems or questions after discharge, call your doctor. HOME CARE INSTRUCTIONS ACTIVITY:  You may resume your regular activity, but move at a slower pace for the next 24 hours.   Take frequent rest periods for the next 24 hours.   Walking will help get rid of the air and reduce the bloated feeling in your belly (abdomen).   No driving for 24 hours (because of the medicine (anesthesia) used during the test).   You may shower.   Do not sign any important legal documents or operate any machinery for 24 hours (because of the anesthesia used during the test).  NUTRITION:  Drink plenty of fluids.   You may resume your normal diet as instructed by your doctor.   Begin with a light meal and progress to your normal diet. Heavy or fried foods are harder to digest and may make you feel sick to your stomach (nauseated).   Avoid alcoholic beverages for 24 hours or as instructed.  MEDICATIONS:  You may resume your normal medications unless your doctor tells you otherwise.  WHAT TO EXPECT TODAY:  Some feelings of bloating in the abdomen.   Passage of more gas than usual.   Spotting of blood in your stool or on the toilet paper.  IF YOU HAD POLYPS REMOVED DURING THE COLONOSCOPY:  No aspirin products for 7 days or as instructed.   No alcohol for 7 days or as instructed.   Eat a soft diet for the next 24 hours.  FINDING OUT THE RESULTS OF YOUR TEST Not all test results are available during your visit. If your test results are not back during the visit, make an  appointment with your caregiver to find out the results. Do not assume everything is normal if you have not heard from your caregiver or the medical facility. It is important for you to follow up on all of your test results.  SEEK IMMEDIATE MEDICAL CARE IF:  You have more than a spotting of blood in your stool.   Your belly is swollen (abdominal distention).   You are nauseated or vomiting.   You have a fever.   You have abdominal pain or discomfort that is severe or gets worse throughout the day.  Document Released: 10/02/2003 Document Revised: 02/06/2011 Document Reviewed: 09/30/2007 [employer] Patient Information 2012 [employer]. 

## 2011-05-14 ENCOUNTER — Encounter (HOSPITAL_COMMUNITY): Payer: Self-pay | Admitting: Internal Medicine

## 2011-05-14 ENCOUNTER — Telehealth: Payer: Self-pay | Admitting: *Deleted

## 2011-05-14 NOTE — Telephone Encounter (Signed)
Called patient and left 2 messages to call the clinic to schedule and appointment with Dr. Daiva Eves to discuss orthopedic referral. Wendall Mola CMA

## 2011-06-03 NOTE — Telephone Encounter (Signed)
See previous note

## 2011-07-07 ENCOUNTER — Other Ambulatory Visit: Payer: Medicare Other

## 2011-07-08 ENCOUNTER — Other Ambulatory Visit: Payer: Medicare Other

## 2011-07-11 ENCOUNTER — Other Ambulatory Visit: Payer: Medicare Other

## 2011-07-11 DIAGNOSIS — B20 Human immunodeficiency virus [HIV] disease: Secondary | ICD-10-CM | POA: Diagnosis not present

## 2011-07-11 LAB — COMPLETE METABOLIC PANEL WITH GFR
Albumin: 4.2 g/dL (ref 3.5–5.2)
Alkaline Phosphatase: 86 U/L (ref 39–117)
BUN: 8 mg/dL (ref 6–23)
Calcium: 8.9 mg/dL (ref 8.4–10.5)
GFR, Est African American: >89 mL/min
GFR, Est Non African American: >89 mL/min
Glucose, Bld: 105 mg/dL — ABNORMAL HIGH (ref 70–99)
Potassium: 3.8 mEq/L (ref 3.5–5.3)

## 2011-07-11 LAB — CBC WITH DIFFERENTIAL/PLATELET
Basophils Absolute: 0 10*3/uL (ref 0.0–0.1)
HCT: 43.4 % (ref 39.0–52.0)
Lymphocytes Relative: 40 % (ref 12–46)
Neutro Abs: 2.2 10*3/uL (ref 1.7–7.7)
Neutrophils Relative %: 44 % (ref 43–77)
Platelets: 193 10*3/uL (ref 150–400)
RDW: 13.2 % (ref 11.5–15.5)
WBC: 5 10*3/uL (ref 4.0–10.5)

## 2011-07-11 LAB — T-HELPER CELL (CD4) - (RCID CLINIC ONLY)
CD4 % Helper T Cell: 25 % — ABNORMAL LOW (ref 33–55)
CD4 T Cell Abs: 490 uL (ref 400–2700)

## 2011-07-21 ENCOUNTER — Ambulatory Visit (INDEPENDENT_AMBULATORY_CARE_PROVIDER_SITE_OTHER): Payer: Medicare Other | Admitting: Infectious Disease

## 2011-07-21 ENCOUNTER — Encounter: Payer: Self-pay | Admitting: Infectious Disease

## 2011-07-21 ENCOUNTER — Other Ambulatory Visit (HOSPITAL_COMMUNITY)
Admission: RE | Admit: 2011-07-21 | Discharge: 2011-07-21 | Disposition: A | Discharge status: Home / Self Care | Payer: Medicare Other | Source: Ambulatory Visit | Attending: Infectious Disease | Admitting: Infectious Disease

## 2011-07-21 VITALS — BP 118/82 | HR 71 | Temp 98.2°F | Wt 198.0 lb

## 2011-07-21 DIAGNOSIS — B20 Human immunodeficiency virus [HIV] disease: Secondary | ICD-10-CM | POA: Diagnosis not present

## 2011-07-21 DIAGNOSIS — Z125 Encounter for screening for malignant neoplasm of prostate: Secondary | ICD-10-CM | POA: Diagnosis not present

## 2011-07-21 DIAGNOSIS — N509 Disorder of male genital organs, unspecified: Secondary | ICD-10-CM

## 2011-07-21 DIAGNOSIS — Z113 Encounter for screening for infections with a predominantly sexual mode of transmission: Secondary | ICD-10-CM | POA: Diagnosis not present

## 2011-07-21 DIAGNOSIS — Z79899 Other long term (current) drug therapy: Secondary | ICD-10-CM | POA: Diagnosis not present

## 2011-07-21 DIAGNOSIS — R351 Nocturia: Secondary | ICD-10-CM

## 2011-07-21 DIAGNOSIS — F329 Major depressive disorder, single episode, unspecified: Secondary | ICD-10-CM | POA: Diagnosis not present

## 2011-07-21 DIAGNOSIS — N50819 Testicular pain, unspecified: Secondary | ICD-10-CM | POA: Insufficient documentation

## 2011-07-21 LAB — URINALYSIS, MICROSCOPIC ONLY: Squamous Epithelial / LPF: NONE SEEN

## 2011-07-21 LAB — PSA: PSA: 147.9 ng/mL — ABNORMAL HIGH (ref <=4.00)

## 2011-07-21 NOTE — Progress Notes (Signed)
Subjective:    Patient ID: Dean Carlson, male    DOB: Sep 20, 1960, 51 y.o.   MRN: [id number]  HPI  51 y.o. male who is doing superbly well on his antiviral regimen, with undetectable viral load and health cd4 count. He has unfortunately beeen suffering from a combinatinon of lower abdominal pain, rectal pain, pain along his lower back and pain that radiates into his scrotum at times. He has had colonoscopy which was negative. I am wondering if he may have chornic prostatitis with epidydimitis. We will do GC and chlamydia today and check post-protatic massage culture. I spent greater than 45 minutes with the patient including greater than 50% of time in face to face counsel of the patient and in coordination of their care.   Review of Systems  Constitutional: Negative for fever, chills, diaphoresis, activity change, appetite change, fatigue and unexpected weight change.  HENT: Negative for congestion, sore throat, rhinorrhea, sneezing, trouble swallowing and sinus pressure.   Eyes: Negative for photophobia and visual disturbance.  Respiratory: Negative for cough, chest tightness, shortness of breath, wheezing and stridor.   Cardiovascular: Negative for chest pain, palpitations and leg swelling.  Gastrointestinal: Negative for nausea, vomiting, abdominal pain, diarrhea, constipation, blood in stool, abdominal distention and anal bleeding.  Genitourinary: Positive for dysuria, flank pain and testicular pain. Negative for hematuria and difficulty urinating.  Musculoskeletal: Negative for myalgias, back pain, joint swelling, arthralgias and gait problem.  Skin: Negative for color change, pallor, rash and wound.  Neurological: Negative for dizziness, tremors, weakness and light-headedness.  Hematological: Negative for adenopathy. Does not bruise/bleed easily.  Psychiatric/Behavioral: Negative for behavioral problems, confusion, sleep disturbance, dysphoric mood, decreased concentration and agitation.         Objective:   Physical Exam  Constitutional: He is oriented to person, place, and time. He appears well-developed and well-nourished. No distress.  HENT:  Head: Normocephalic and atraumatic.  Mouth/Throat: Oropharynx is clear and moist. No oropharyngeal exudate.  Eyes: Conjunctivae and EOM are normal. Pupils are equal, round, and reactive to light. No scleral icterus.  Neck: Normal range of motion. Neck supple. No JVD present.  Cardiovascular: Normal rate, regular rhythm and normal heart sounds.  Exam reveals no gallop and no friction rub.   No murmur heard. Pulmonary/Chest: Effort normal and breath sounds normal. No respiratory distress. He has no wheezes. He has no rales. He exhibits no tenderness.  Abdominal: He exhibits no distension and no mass. There is no tenderness. There is no rebound and no guarding. Hernia confirmed negative in the right inguinal area and confirmed negative in the left inguinal area.  Genitourinary: Testes normal and penis normal. Rectal exam shows external hemorrhoid. Prostate is enlarged and tender. Circumcised.  Musculoskeletal: He exhibits no edema and no tenderness.  Lymphadenopathy:    He has no cervical adenopathy.       Right: No inguinal adenopathy present.       Left: No inguinal adenopathy present.  Neurological: He is alert and oriented to person, place, and time. He has normal reflexes. He exhibits normal muscle tone. Coordination normal.  Skin: Skin is warm and dry. He is not diaphoretic. No erythema. No pallor.  Psychiatric: He has a normal mood and affect. His behavior is normal. Judgment and thought content normal.          Assessment & Plan:  HIV DISEASE Perfect control!  Testicular pain Check urine and chlamydia and mid stream post prostate massage culture. Will likely give him 1-2 months of fluoroquinolone  for possible prostatitis if above workup is negative  DISORDER, DEPRESSIVE NEC Still has troubles at times. Relies on  faith. Does not want SSRI

## 2011-07-21 NOTE — Assessment & Plan Note (Signed)
Perfect control 

## 2011-07-21 NOTE — Assessment & Plan Note (Signed)
Still has troubles at times. Relies on faith. Does not want SSRI

## 2011-07-21 NOTE — Assessment & Plan Note (Signed)
Check urine and chlamydia and mid stream post prostate massage culture. Will likely give him 1-2 months of fluoroquinolone for possible prostatitis if above workup is negative

## 2011-07-22 ENCOUNTER — Telehealth: Payer: Self-pay | Admitting: Infectious Disease

## 2011-07-22 ENCOUNTER — Encounter: Payer: Self-pay | Admitting: Infectious Disease

## 2011-07-22 DIAGNOSIS — N419 Inflammatory disease of prostate, unspecified: Secondary | ICD-10-CM | POA: Insufficient documentation

## 2011-07-22 MED ORDER — LEVOFLOXACIN 500 MG PO TABS: 500.0000 mg | ORAL_TABLET | Freq: Every day | ORAL | Status: DC

## 2011-07-22 NOTE — Telephone Encounter (Signed)
[name] can you let Mr Dean Carlson know that he needs to pick up levaquin and take it for the next 2 months. We should bring him back when he finishes this therapy to recheck his prostate antigen which was very high--likely due to an infection

## 2011-07-23 ENCOUNTER — Other Ambulatory Visit: Payer: Self-pay | Admitting: Licensed Clinical Social Worker

## 2011-07-23 DIAGNOSIS — N419 Inflammatory disease of prostate, unspecified: Secondary | ICD-10-CM

## 2011-07-23 LAB — URINE CULTURE
Colony Count: NO GROWTH
Organism ID, Bacteria: NO GROWTH

## 2011-07-23 MED ORDER — LEVOFLOXACIN 500 MG PO TABS: 500.0000 mg | ORAL_TABLET | Freq: Every day | ORAL | Status: AC

## 2011-07-30 ENCOUNTER — Telehealth: Payer: Self-pay | Admitting: *Deleted

## 2011-07-30 NOTE — Telephone Encounter (Signed)
Pt was wondering whether he was OK to continue to use his Androgel prescription with the Levaquin prescription.  The patient had stopped the Androgel thinking that there might be a problem.  From Dr. Clinton Gallant notes there was no comment about the patient needing to stop the Androgel when taking the Levaquin.  RN thanked the patient for calling with his question and encouraged the patient to call whenever he had a question regarding medications.  The patient verbalized understanding and will continue the use of the Androgel.

## 2011-09-24 ENCOUNTER — Other Ambulatory Visit: Payer: Medicare Other

## 2011-10-08 ENCOUNTER — Ambulatory Visit: Payer: Medicare Other | Admitting: Infectious Disease

## 2011-10-20 ENCOUNTER — Other Ambulatory Visit: Payer: Medicare Other

## 2011-10-20 DIAGNOSIS — B20 Human immunodeficiency virus [HIV] disease: Secondary | ICD-10-CM | POA: Diagnosis not present

## 2011-10-20 LAB — CBC WITH DIFFERENTIAL/PLATELET
Eosinophils Absolute: 0.1 10*3/uL (ref 0.0–0.7)
Eosinophils Relative: 3 % (ref 0–5)
HCT: 42.9 % (ref 39.0–52.0)
Lymphocytes Relative: 44 % (ref 12–46)
Lymphs Abs: 1.6 10*3/uL (ref 0.7–4.0)
MCH: 31.2 pg (ref 26.0–34.0)
MCV: 88.6 fL (ref 78.0–100.0)
Monocytes Absolute: 0.5 10*3/uL (ref 0.1–1.0)
Platelets: 206 10*3/uL (ref 150–400)
RBC: 4.84 MIL/uL (ref 4.22–5.81)
RDW: 13.6 % (ref 11.5–15.5)

## 2011-10-20 LAB — COMPLETE METABOLIC PANEL WITH GFR
ALT: 26 U/L (ref 0–53)
AST: 18 U/L (ref 0–37)
Albumin: 4.2 g/dL (ref 3.5–5.2)
Alkaline Phosphatase: 83 U/L (ref 39–117)
Calcium: 9.4 mg/dL (ref 8.4–10.5)
Chloride: 104 mEq/L (ref 96–112)
Potassium: 4.1 mEq/L (ref 3.5–5.3)

## 2011-10-21 ENCOUNTER — Other Ambulatory Visit (INDEPENDENT_AMBULATORY_CARE_PROVIDER_SITE_OTHER): Payer: Medicare Other

## 2011-10-21 DIAGNOSIS — B20 Human immunodeficiency virus [HIV] disease: Secondary | ICD-10-CM | POA: Diagnosis not present

## 2011-10-21 LAB — T-HELPER CELL (CD4) - (RCID CLINIC ONLY)
CD4 % Helper T Cell: 25 % — ABNORMAL LOW (ref 33–55)
CD4 T Cell Abs: 430 uL (ref 400–2700)

## 2011-10-21 NOTE — Addendum Note (Signed)
Addended by: Mariea Clonts D on: 10/21/2011 01:50 PM   Modules accepted: Orders

## 2011-10-23 LAB — HIV-1 RNA QUANT-NO REFLEX-BLD: HIV 1 RNA Quant: <20 copies/mL (ref <20)

## 2011-11-17 ENCOUNTER — Encounter: Payer: Self-pay | Admitting: Infectious Disease

## 2011-11-17 ENCOUNTER — Other Ambulatory Visit (HOSPITAL_COMMUNITY)
Admission: RE | Admit: 2011-11-17 | Discharge: 2011-11-17 | Disposition: A | Discharge status: Home / Self Care | Payer: Medicare Other | Source: Ambulatory Visit | Attending: Infectious Disease | Admitting: Infectious Disease

## 2011-11-17 ENCOUNTER — Ambulatory Visit (INDEPENDENT_AMBULATORY_CARE_PROVIDER_SITE_OTHER): Payer: Medicare Other | Admitting: Infectious Disease

## 2011-11-17 VITALS — BP 125/88 | HR 70 | Temp 98.3°F | Ht 70.0 in | Wt 194.0 lb

## 2011-11-17 DIAGNOSIS — Z113 Encounter for screening for infections with a predominantly sexual mode of transmission: Secondary | ICD-10-CM | POA: Insufficient documentation

## 2011-11-17 DIAGNOSIS — N419 Inflammatory disease of prostate, unspecified: Secondary | ICD-10-CM

## 2011-11-17 DIAGNOSIS — N400 Enlarged prostate without lower urinary tract symptoms: Secondary | ICD-10-CM | POA: Diagnosis not present

## 2011-11-17 DIAGNOSIS — Z23 Encounter for immunization: Secondary | ICD-10-CM | POA: Diagnosis not present

## 2011-11-17 DIAGNOSIS — R972 Elevated prostate specific antigen [PSA]: Secondary | ICD-10-CM | POA: Diagnosis not present

## 2011-11-17 DIAGNOSIS — B20 Human immunodeficiency virus [HIV] disease: Secondary | ICD-10-CM | POA: Diagnosis not present

## 2011-11-17 DIAGNOSIS — E291 Testicular hypofunction: Secondary | ICD-10-CM

## 2011-11-17 DIAGNOSIS — E785 Hyperlipidemia, unspecified: Secondary | ICD-10-CM

## 2011-11-17 LAB — PSA, TOTAL AND FREE
PSA, Free Pct: 6 % — ABNORMAL LOW (ref >25)
PSA, Free: 11.88 ng/mL

## 2011-11-17 MED ORDER — LEVOFLOXACIN 500 MG PO TABS: 500.0000 mg | ORAL_TABLET | Freq: Every day | ORAL | Status: DC

## 2011-11-17 MED ORDER — TAMSULOSIN HCL 0.4 MG PO CAPS: 0.4000 mg | ORAL_CAPSULE | Freq: Every day | ORAL | Status: DC

## 2011-11-17 NOTE — Assessment & Plan Note (Signed)
Full off on restarting his testosterone given concern for BPH and or prostate cancer potential.

## 2011-11-17 NOTE — Assessment & Plan Note (Signed)
As above we will check urine analysis and culture as well as gonorrhea and Chlamydia from urine. If cultures are negative we'll go ahead and treat him with levofloxacin for 2 months again. Again will refer him to urology.

## 2011-11-17 NOTE — Progress Notes (Signed)
Subjective:    Patient ID: Dean Carlson, male    DOB: [birth year], 51 y.o.   MRN: [id number]  HPI  Dean Carlson is a 51 y.o. male who is doing superbly well on his  antiviral regimen, ATRIPLA with undetectable viral load and health cd4 count. In the month of May we had seen Dean Carlson with prostatitis and found his prior prostate-specific antigen to be 149. His prostate was enlarged on exam we gave him levofloxacin for 2 months. The levofloxacin did help with several of his symptoms. His hematuria stopped his testCular pain resolved and back pain improved as well. He was doing well up and noticed what once after masturbating that he again started having pain in his left testicle and increased urination at night. Along with return of some of his back pain. He returns to clinic [facility]  Exam today Dean Carlson does have a large prostate he has no evidence of enlarged testicle or testicular tenderness. I'm checking his urine for culture postprostatic massage as well as retesting for gonorrhea and Chlamydia although is not currently sexual active. I'll recheck his prostate-specific antigen today and I will refer him to urology.  I spent greater than 45 minutes with the patient including greater than 50% of time in face to face counsel of the patient and in coordination of their care.    Review of Systems  Constitutional: Negative for fever, chills, diaphoresis, activity change, appetite change, fatigue and unexpected weight change.  HENT: Negative for congestion, sore throat, rhinorrhea, sneezing, trouble swallowing and sinus pressure.   Eyes: Negative for photophobia and visual disturbance.  Respiratory: Negative for cough, chest tightness, shortness of breath, wheezing and stridor.   Cardiovascular: Negative for chest pain, palpitations and leg swelling.  Gastrointestinal: Negative for nausea, vomiting, abdominal pain, diarrhea, constipation, blood in  stool, abdominal distention and anal bleeding.  Genitourinary: Positive for scrotal swelling. Negative for dysuria, hematuria, flank pain and difficulty urinating.  Musculoskeletal: Positive for back pain. Negative for myalgias, joint swelling, arthralgias and gait problem.  Skin: Negative for color change, pallor, rash and wound.  Neurological: Negative for dizziness, tremors, weakness and light-headedness.  Hematological: Negative for adenopathy. Does not bruise/bleed easily.  Psychiatric/Behavioral: Negative for behavioral problems, confusion, disturbed wake/sleep cycle, dysphoric mood, decreased concentration and agitation.       Objective:   Physical Exam  Constitutional: He is oriented to person, place, and time. He appears well-developed and well-nourished. No distress.  HENT:  Head: Normocephalic and atraumatic.  Mouth/Throat: Oropharynx is clear and moist. No oropharyngeal exudate.  Eyes: Conjunctivae normal and EOM are normal. Pupils are equal, round, and reactive to light. No scleral icterus.  Neck: Normal range of motion. Neck supple. No JVD present.  Cardiovascular: Normal rate, regular rhythm and normal heart sounds.  Exam reveals no gallop and no friction rub.   No murmur heard. Pulmonary/Chest: Effort normal and breath sounds normal. No respiratory distress. He has no wheezes. He has no rales. He exhibits no tenderness.  Abdominal: He exhibits no distension and no mass. There is no tenderness. There is no rebound and no guarding.  Genitourinary: Testes normal and penis normal. Rectal exam shows external hemorrhoid. Prostate is enlarged. Prostate is not tender. Circumcised.  Musculoskeletal: He exhibits no edema and no tenderness.  Lymphadenopathy:    He has no cervical adenopathy.  Neurological: He is alert and oriented to person, place, and time. He has normal reflexes. He exhibits normal muscle tone.  Coordination normal.  Skin: Skin is warm and dry. He is not diaphoretic.  No erythema. No pallor.  Psychiatric: He has a normal mood and affect. His behavior is normal. Judgment and thought content normal.          Assessment & Plan:  HIV DISEASE Perfect control continue Atripla  Elevated PSA Will test for and retreat empirically choose are negative. I'll recheck his prostate-specific antigen. Also refer him to urology as there is some concern that he could have prostate cancer. Also start him on Flomax at night to see if this will help as well.  Hypogonadism male Full off on restarting his testosterone given concern for BPH and or prostate cancer potential.  Prostatitis As above we will check urine analysis and culture as well as gonorrhea and Chlamydia from urine. If cultures are negative we'll go ahead and treat him with levofloxacin for 2 months again. Again will refer him to urology.

## 2011-11-17 NOTE — Assessment & Plan Note (Signed)
Will test for and retreat empirically choose are negative. I'll recheck his prostate-specific antigen. Also refer him to urology as there is some concern that he could have prostate cancer. Also start him on Flomax at night to see if this will help as well.

## 2011-11-17 NOTE — Assessment & Plan Note (Signed)
Perfect control continue Atripla 

## 2011-11-18 LAB — URINALYSIS, ROUTINE W REFLEX MICROSCOPIC
Bilirubin Urine: NEGATIVE
Glucose, UA: NEGATIVE mg/dL
Hgb urine dipstick: NEGATIVE
Ketones, ur: NEGATIVE mg/dL
Protein, ur: NEGATIVE mg/dL
Urobilinogen, UA: 0.2 mg/dL (ref 0.0–1.0)

## 2011-11-19 ENCOUNTER — Other Ambulatory Visit: Payer: Self-pay | Admitting: Licensed Clinical Social Worker

## 2011-11-19 DIAGNOSIS — R972 Elevated prostate specific antigen [PSA]: Secondary | ICD-10-CM

## 2011-12-29 ENCOUNTER — Other Ambulatory Visit: Payer: Self-pay | Admitting: *Deleted

## 2011-12-29 DIAGNOSIS — B20 Human immunodeficiency virus [HIV] disease: Secondary | ICD-10-CM

## 2011-12-29 MED ORDER — EFAVIRENZ-EMTRICITAB-TENOFOVIR 600-200-300 MG PO TABS: 1.0000 | ORAL_TABLET | Freq: Every day | ORAL | Status: DC

## 2012-03-10 ENCOUNTER — Other Ambulatory Visit (INDEPENDENT_AMBULATORY_CARE_PROVIDER_SITE_OTHER): Payer: Medicare Other

## 2012-03-10 DIAGNOSIS — E785 Hyperlipidemia, unspecified: Secondary | ICD-10-CM

## 2012-03-10 DIAGNOSIS — B20 Human immunodeficiency virus [HIV] disease: Secondary | ICD-10-CM

## 2012-03-10 DIAGNOSIS — Z113 Encounter for screening for infections with a predominantly sexual mode of transmission: Secondary | ICD-10-CM | POA: Diagnosis not present

## 2012-03-10 LAB — COMPLETE METABOLIC PANEL WITH GFR
ALT: 29 U/L (ref 0–53)
AST: 26 U/L (ref 0–37)
Albumin: 3.9 g/dL (ref 3.5–5.2)
Alkaline Phosphatase: 82 U/L (ref 39–117)
GFR, Est Non African American: >89 mL/min
Glucose, Bld: 125 mg/dL — ABNORMAL HIGH (ref 70–99)
Potassium: 4.3 mEq/L (ref 3.5–5.3)
Sodium: 143 mEq/L (ref 135–145)
Total Protein: 6.5 g/dL (ref 6.0–8.3)

## 2012-03-10 LAB — CBC WITH DIFFERENTIAL/PLATELET
Basophils Relative: 1 % (ref 0–1)
Eosinophils Absolute: 0.1 10*3/uL (ref 0.0–0.7)
Eosinophils Relative: 2 % (ref 0–5)
HCT: 41.8 % (ref 39.0–52.0)
Hemoglobin: 14.2 g/dL (ref 13.0–17.0)
Lymphs Abs: 1.8 10*3/uL (ref 0.7–4.0)
MCH: 30.7 pg (ref 26.0–34.0)
MCHC: 34 g/dL (ref 30.0–36.0)
MCV: 90.5 fL (ref 78.0–100.0)
Monocytes Absolute: 0.5 10*3/uL (ref 0.1–1.0)
Monocytes Relative: 12 % (ref 3–12)
Neutrophils Relative %: 44 % (ref 43–77)
RBC: 4.62 MIL/uL (ref 4.22–5.81)

## 2012-03-10 LAB — LIPID PANEL
LDL Cholesterol: 123 mg/dL — ABNORMAL HIGH (ref 0–99)
Total CHOL/HDL Ratio: 3.8 Ratio
VLDL: 19 mg/dL (ref 0–40)

## 2012-03-10 LAB — RPR

## 2012-03-11 LAB — HIV-1 RNA QUANT-NO REFLEX-BLD: HIV-1 RNA Quant, Log: <1.3 {Log} (ref <1.30)

## 2012-03-11 LAB — T-HELPER CELL (CD4) - (RCID CLINIC ONLY): CD4 T Cell Abs: 430 uL (ref 400–2700)

## 2012-03-17 ENCOUNTER — Telehealth: Payer: Self-pay | Admitting: *Deleted

## 2012-03-17 NOTE — Telephone Encounter (Signed)
Nasal congestion started yesterday, "all of a sudden."  Thinks he had a fever yesterday afternoon.  No fever this AM, c/o slight pain in sinuses today.  RN advised OTC generic Claritin (nasal congestion), Mucinex (nasal congestion) and ibuprofen (pain/fever) per the package instructions.  RN advised pt to call tomorrow if symptoms worsen or fever returns and continues until tomorrow morning.  Pt verbalized understanding.

## 2012-03-24 ENCOUNTER — Ambulatory Visit: Payer: Medicare Other | Admitting: Infectious Disease

## 2012-03-29 ENCOUNTER — Other Ambulatory Visit: Payer: Self-pay | Admitting: *Deleted

## 2012-03-29 DIAGNOSIS — B20 Human immunodeficiency virus [HIV] disease: Secondary | ICD-10-CM

## 2012-03-29 MED ORDER — EFAVIRENZ-EMTRICITAB-TENOFOVIR 600-200-300 MG PO TABS: 1.0000 | ORAL_TABLET | Freq: Every day | ORAL | Status: DC

## 2012-04-02 ENCOUNTER — Telehealth: Payer: Self-pay | Admitting: *Deleted

## 2012-04-02 NOTE — Telephone Encounter (Signed)
Patient called c/o ongoing sinus congestion and wanted to know about taking OTC sinus medication. Told him this is fine as long as there is no aspirin in it as he has an allergy to aspirin. Asked him to call us back on Monday if no better to arrange an appointment. Wendall Mola CMA

## 2012-04-28 ENCOUNTER — Ambulatory Visit (INDEPENDENT_AMBULATORY_CARE_PROVIDER_SITE_OTHER): Payer: Medicare Other | Admitting: Infectious Disease

## 2012-04-28 ENCOUNTER — Encounter: Payer: Self-pay | Admitting: Infectious Disease

## 2012-04-28 VITALS — BP 136/84 | HR 89 | Temp 98.4°F | Ht 69.0 in | Wt 202.0 lb

## 2012-04-28 DIAGNOSIS — R7989 Other specified abnormal findings of blood chemistry: Secondary | ICD-10-CM

## 2012-04-28 DIAGNOSIS — F329 Major depressive disorder, single episode, unspecified: Secondary | ICD-10-CM

## 2012-04-28 DIAGNOSIS — R972 Elevated prostate specific antigen [PSA]: Secondary | ICD-10-CM | POA: Diagnosis not present

## 2012-04-28 DIAGNOSIS — B20 Human immunodeficiency virus [HIV] disease: Secondary | ICD-10-CM

## 2012-04-28 DIAGNOSIS — E291 Testicular hypofunction: Secondary | ICD-10-CM

## 2012-04-28 NOTE — Progress Notes (Signed)
Subjective:    Patient ID: Dean Carlson, male    DOB: October 10, 1960, 52 y.o.   MRN: [id number]  HPI  Dean Carlson is a 51y.o. male who is doing superbly well on his antiviral regimen, ATRIPLA with undetectable viral load and health cd4 count. In the month of May 2013 we had seen Dean Carlson with prostatitis and found his prior prostate-specific antigen to be 149. His prostate was enlarged on exam we gave him levofloxacin for 2 months. The levofloxacin did help with several of his symptoms. His hematuria stopped his testCular pain resolved and back pain improved as well. When we He checked a prostate-specific antigen in September it was still elevated above 200. I wanted him to be seen by urology but this was never done. He feels better symptom wise as far as possible prostate enlargement. However we have never repeated the prostate-specific antigen again as I would like to do today.  Dean Carlson also suffering from from some depressive symptoms but denies active or passive suicidal ideation. He is uninterested in being given any antidepressant medications but prefers to deal with his symptoms of depression anxiety through his spirituality. I have offered him counselling with Dean Carlson   Review of Systems  Constitutional: Negative for fever, chills, diaphoresis, activity change, appetite change, fatigue and unexpected weight change.  HENT: Negative for congestion, sore throat, rhinorrhea, sneezing, trouble swallowing and sinus pressure.   Eyes: Negative for photophobia and visual disturbance.  Respiratory: Negative for cough, chest tightness, shortness of breath, wheezing and stridor.   Cardiovascular: Negative for chest pain, palpitations and leg swelling.  Gastrointestinal: Negative for nausea, vomiting, abdominal pain, diarrhea, constipation, blood in stool, abdominal distention and anal bleeding.  Genitourinary: Negative for dysuria, hematuria, flank pain and difficulty urinating.  Musculoskeletal:  Negative for myalgias, back pain, joint swelling, arthralgias and gait problem.  Skin: Negative for color change, pallor, rash and wound.  Neurological: Negative for dizziness, tremors, weakness and light-headedness.  Hematological: Negative for adenopathy. Does not bruise/bleed easily.  Psychiatric/Behavioral: Positive for dysphoric mood and agitation. Negative for behavioral problems, confusion, sleep disturbance and decreased concentration.       Objective:   Physical Exam  Constitutional: He is oriented to person, place, and time. He appears well-developed and well-nourished. No distress.  HENT:  Head: Normocephalic and atraumatic.  Mouth/Throat: Oropharynx is clear and moist. No oropharyngeal exudate.  Eyes: Conjunctivae and EOM are normal. Pupils are equal, round, and reactive to light. No scleral icterus.  Neck: Normal range of motion. Neck supple. No JVD present.  Cardiovascular: Normal rate, regular rhythm and normal heart sounds.  Exam reveals no gallop and no friction rub.   No murmur heard. Pulmonary/Chest: Effort normal and breath sounds normal. No respiratory distress. He has no wheezes. He has no rales. He exhibits no tenderness.  Abdominal: He exhibits no distension and no mass. There is no tenderness. There is no rebound and no guarding.  Musculoskeletal: He exhibits no edema and no tenderness.  Lymphadenopathy:    He has no cervical adenopathy.  Neurological: He is alert and oriented to person, place, and time. He has normal reflexes. He exhibits normal muscle tone. Coordination normal.  Skin: Skin is warm and dry. He is not diaphoretic. No erythema. No pallor.  Psychiatric: He has a normal mood and affect. His behavior is normal. Judgment and thought content normal.          Assessment & Plan:  HIV: Perfect control continue Atripla  Elevated prostate-specific antigen: Recheck  labs today and if this is still elevated will refer him to urology and have decided that  he really does need to be seen by them if this is still elevated.  Depression patient reports first to deal with this through his religious support.  Low testosterone: Will not reinstitute replacement as the testosterone seems to have aggravated the elevation in his prostate-specific antigen and his symptoms consistent with BPH.  bPH: Continue Flomax again if PSA is still high will refer to urology.

## 2012-04-29 ENCOUNTER — Telehealth: Payer: Self-pay | Admitting: Infectious Disease

## 2012-04-29 DIAGNOSIS — R972 Elevated prostate specific antigen [PSA]: Secondary | ICD-10-CM

## 2012-04-29 NOTE — Telephone Encounter (Signed)
Dean Carlson PSA is OFF the charts. He needs urology appt and likely biopsy

## 2012-04-30 NOTE — Telephone Encounter (Signed)
Perfect

## 2012-04-30 NOTE — Telephone Encounter (Signed)
I have the patient scheduled for March 20 at 10:45am, patient aware

## 2012-05-05 DIAGNOSIS — H2589 Other age-related cataract: Secondary | ICD-10-CM | POA: Diagnosis not present

## 2012-05-05 DIAGNOSIS — H2510 Age-related nuclear cataract, unspecified eye: Secondary | ICD-10-CM | POA: Diagnosis not present

## 2012-05-05 DIAGNOSIS — H25019 Cortical age-related cataract, unspecified eye: Secondary | ICD-10-CM | POA: Diagnosis not present

## 2012-06-21 DIAGNOSIS — N402 Nodular prostate without lower urinary tract symptoms: Secondary | ICD-10-CM | POA: Diagnosis not present

## 2012-06-21 DIAGNOSIS — R972 Elevated prostate specific antigen [PSA]: Secondary | ICD-10-CM | POA: Diagnosis not present

## 2012-07-13 ENCOUNTER — Other Ambulatory Visit: Payer: Medicare Other

## 2012-07-13 ENCOUNTER — Other Ambulatory Visit: Payer: Self-pay | Admitting: Infectious Disease

## 2012-07-13 DIAGNOSIS — B20 Human immunodeficiency virus [HIV] disease: Secondary | ICD-10-CM | POA: Diagnosis not present

## 2012-07-13 LAB — CBC WITH DIFFERENTIAL/PLATELET
Hemoglobin: 14.4 g/dL (ref 13.0–17.0)
Lymphocytes Relative: 46 % (ref 12–46)
Lymphs Abs: 2.1 10*3/uL (ref 0.7–4.0)
MCH: 30.9 pg (ref 26.0–34.0)
Monocytes Relative: 13 % — ABNORMAL HIGH (ref 3–12)
Neutro Abs: 1.8 10*3/uL (ref 1.7–7.7)
Neutrophils Relative %: 40 % — ABNORMAL LOW (ref 43–77)
Platelets: 196 10*3/uL (ref 150–400)
RBC: 4.66 MIL/uL (ref 4.22–5.81)
WBC: 4.6 10*3/uL (ref 4.0–10.5)

## 2012-07-13 LAB — COMPLETE METABOLIC PANEL WITH GFR
ALT: 24 U/L (ref 0–53)
BUN: 7 mg/dL (ref 6–23)
CO2: 28 mEq/L (ref 19–32)
Calcium: 8.9 mg/dL (ref 8.4–10.5)
Creat: 0.96 mg/dL (ref 0.50–1.35)
GFR, Est African American: >89 mL/min
GFR, Est Non African American: >89 mL/min
Total Bilirubin: 0.5 mg/dL (ref 0.3–1.2)

## 2012-07-14 LAB — HIV-1 RNA QUANT-NO REFLEX-BLD: HIV-1 RNA Quant, Log: <1.3 {Log} (ref <1.30)

## 2012-07-14 LAB — T-HELPER CELL (CD4) - (RCID CLINIC ONLY)
CD4 % Helper T Cell: 24 % — ABNORMAL LOW (ref 33–55)
CD4 T Cell Abs: 540 uL (ref 400–2700)

## 2012-07-28 ENCOUNTER — Encounter: Payer: Self-pay | Admitting: Infectious Disease

## 2012-07-28 ENCOUNTER — Telehealth: Payer: Self-pay | Admitting: *Deleted

## 2012-07-28 ENCOUNTER — Ambulatory Visit (INDEPENDENT_AMBULATORY_CARE_PROVIDER_SITE_OTHER): Payer: Medicare Other | Admitting: Infectious Disease

## 2012-07-28 VITALS — BP 115/76 | HR 80 | Temp 98.8°F | Wt 209.0 lb

## 2012-07-28 DIAGNOSIS — C61 Malignant neoplasm of prostate: Secondary | ICD-10-CM

## 2012-07-28 DIAGNOSIS — N400 Enlarged prostate without lower urinary tract symptoms: Secondary | ICD-10-CM | POA: Diagnosis not present

## 2012-07-28 DIAGNOSIS — E785 Hyperlipidemia, unspecified: Secondary | ICD-10-CM

## 2012-07-28 DIAGNOSIS — R7989 Other specified abnormal findings of blood chemistry: Secondary | ICD-10-CM

## 2012-07-28 DIAGNOSIS — Z113 Encounter for screening for infections with a predominantly sexual mode of transmission: Secondary | ICD-10-CM

## 2012-07-28 DIAGNOSIS — B20 Human immunodeficiency virus [HIV] disease: Secondary | ICD-10-CM

## 2012-07-28 DIAGNOSIS — E291 Testicular hypofunction: Secondary | ICD-10-CM | POA: Diagnosis not present

## 2012-07-28 NOTE — Telephone Encounter (Signed)
Appointment confirmed with Dr. Brunilda Payor at [facility] - 6/16 at 11:00. Andree Coss, RN

## 2012-07-28 NOTE — Progress Notes (Signed)
Subjective:    Patient ID: Dean Carlson, male    DOB: 10-12-1960, 52 y.o.   MRN: [id number]  HPI   Dean Carlson is a 52y.o. male who is doing superbly well on his antiviral regimen, ATRIPLA with undetectable viral load and health cd4 count. In the month of May 2013 we had seen [place] with prostatitis and found his prior prostate-specific antigen to be 149. His prostate was enlarged on exam we gave him levofloxacin for 2 months. The levofloxacin did help with several of his symptoms. His hematuria stopped his testCular pain resolved and back pain improved as well. When we He checked a prostate-specific antigen in September it was still elevated above 200. I wanted him to be seen by urology but this was never done. He feels better symptom wise as far as possible prostate enlargement.   Of not he had been on testosterone therapy but has been off for nearly a year now and PSA continues to rise. He claims to be Dr. Brunilda Payor [facility] and that he is scheduled for biopsy with him. I want to make sure he does get this done.  Otherwise He is doing fairly well with continued spiritual support.   Review of Systems  Constitutional: Negative for fever, chills, diaphoresis, activity change, appetite change, fatigue and unexpected weight change.  HENT: Negative for congestion, sore throat, rhinorrhea, sneezing, trouble swallowing and sinus pressure.   Eyes: Negative for photophobia and visual disturbance.  Respiratory: Negative for cough, chest tightness, shortness of breath, wheezing and stridor.   Cardiovascular: Negative for chest pain, palpitations and leg swelling.  Gastrointestinal: Negative for nausea, vomiting, abdominal pain, diarrhea, constipation, blood in stool, abdominal distention and anal bleeding.  Genitourinary: Negative for dysuria, hematuria, flank pain and difficulty urinating.  Musculoskeletal: Negative for myalgias, back pain, joint swelling, arthralgias and gait problem.  Skin: Negative for  color change, pallor, rash and wound.  Neurological: Negative for dizziness, tremors, weakness and light-headedness.  Hematological: Negative for adenopathy. Does not bruise/bleed easily.  Psychiatric/Behavioral: Positive for dysphoric mood and agitation. Negative for behavioral problems, confusion, sleep disturbance and decreased concentration.       Objective:   Physical Exam  Constitutional: He is oriented to person, place, and time. He appears well-developed and well-nourished. No distress.  HENT:  Head: Normocephalic and atraumatic.  Mouth/Throat: Oropharynx is clear and moist. No oropharyngeal exudate.  Eyes: Conjunctivae and EOM are normal. Pupils are equal, round, and reactive to light. No scleral icterus.  Neck: Normal range of motion. Neck supple. No JVD present.  Cardiovascular: Normal rate, regular rhythm and normal heart sounds.  Exam reveals no gallop and no friction rub.   No murmur heard. Pulmonary/Chest: Effort normal and breath sounds normal. No respiratory distress. He has no wheezes. He has no rales. He exhibits no tenderness.  Abdominal: He exhibits no distension and no mass. There is no tenderness. There is no rebound and no guarding.  Musculoskeletal: He exhibits no edema and no tenderness.  Lymphadenopathy:    He has no cervical adenopathy.  Neurological: He is alert and oriented to person, place, and time. He has normal reflexes. He exhibits normal muscle tone. Coordination normal.  Skin: Skin is warm and dry. He is not diaphoretic. No erythema. No pallor.  Psychiatric: He has a normal mood and affect. His behavior is normal. Judgment and thought content normal.          Assessment & Plan:  HIV: Perfect control continue Atripla  Elevated prostate-specific antigen: Recheck labs  today  Make sure he has biopsy!  Depression patient reports first to deal with this through his religious support.  Low testosterone: Will not reinstitute replacement as the  testosterone seems to have aggravated the elevation in his prostate-specific antigen and his symptoms consistent with   bPH: Continue Flomax again if PSA is still high will refer to urology.

## 2012-07-29 LAB — PSA: PSA: 276.9 ng/mL — ABNORMAL HIGH (ref <=4.00)

## 2012-08-16 DIAGNOSIS — R972 Elevated prostate specific antigen [PSA]: Secondary | ICD-10-CM | POA: Diagnosis not present

## 2012-08-16 DIAGNOSIS — IMO0002 Reserved for concepts with insufficient information to code with codable children: Secondary | ICD-10-CM | POA: Diagnosis not present

## 2012-08-16 HISTORY — PX: PROSTATE BIOPSY: SHX241

## 2012-08-18 ENCOUNTER — Other Ambulatory Visit (HOSPITAL_COMMUNITY): Payer: Self-pay | Admitting: Urology

## 2012-08-18 DIAGNOSIS — R972 Elevated prostate specific antigen [PSA]: Secondary | ICD-10-CM

## 2012-08-24 ENCOUNTER — Encounter (HOSPITAL_COMMUNITY): Payer: Medicare Other

## 2012-09-01 ENCOUNTER — Encounter (HOSPITAL_COMMUNITY)
Admission: RE | Admit: 2012-09-01 | Discharge: 2012-09-01 | Disposition: A | Discharge status: Home / Self Care | Payer: Medicare Other | Source: Ambulatory Visit | Attending: Urology | Admitting: Urology

## 2012-09-01 ENCOUNTER — Telehealth: Payer: Self-pay | Admitting: Licensed Clinical Social Worker

## 2012-09-01 DIAGNOSIS — R972 Elevated prostate specific antigen [PSA]: Secondary | ICD-10-CM | POA: Insufficient documentation

## 2012-09-01 MED ORDER — TECHNETIUM TC 99M MEDRONATE IV KIT
25.0000 | PACK | Freq: Once | INTRAVENOUS | Status: AC | PRN
  Administered 2012-09-01: 25 via INTRAVENOUS

## 2012-09-01 NOTE — Telephone Encounter (Signed)
I would like to get him in sooner if possible mainly to make sure he is doing ok emotionally

## 2012-09-01 NOTE — Telephone Encounter (Signed)
Patient called to let Dr. Daiva Eves know that he was getting a bone scan today and recently had a biopsy of his prostate and per the patient it was cancerous. This is FYI for Dr. Daiva Eves

## 2012-09-01 NOTE — Telephone Encounter (Signed)
I feared as much. When is he coming back to see me next?

## 2012-09-01 NOTE — Telephone Encounter (Signed)
He is scheduled 11/29/12

## 2012-09-01 NOTE — Telephone Encounter (Signed)
You don't have anything available unless you have a day in mind that you would like to overbook

## 2012-10-07 ENCOUNTER — Telehealth: Payer: Self-pay | Admitting: *Deleted

## 2012-10-07 NOTE — Telephone Encounter (Signed)
Patient called wanting to let this clinic know that he will keep the oncology appt, but he has no intention of taking chemotherapy. If there are shots they can give him he will do that, but he is not willing to do chemo. Dean Carlson

## 2012-10-12 DIAGNOSIS — N390 Urinary tract infection, site not specified: Secondary | ICD-10-CM | POA: Diagnosis not present

## 2012-10-12 DIAGNOSIS — C61 Malignant neoplasm of prostate: Secondary | ICD-10-CM | POA: Diagnosis not present

## 2012-10-15 ENCOUNTER — Other Ambulatory Visit (HOSPITAL_COMMUNITY): Payer: Self-pay | Admitting: Urology

## 2012-10-15 DIAGNOSIS — C61 Malignant neoplasm of prostate: Secondary | ICD-10-CM

## 2012-10-25 ENCOUNTER — Ambulatory Visit (HOSPITAL_COMMUNITY)
Admission: RE | Admit: 2012-10-25 | Discharge: 2012-10-25 | Disposition: A | Discharge status: Home / Self Care | Payer: Medicare Other | Source: Ambulatory Visit | Attending: Urology | Admitting: Urology

## 2012-10-25 DIAGNOSIS — R599 Enlarged lymph nodes, unspecified: Secondary | ICD-10-CM | POA: Diagnosis not present

## 2012-10-25 DIAGNOSIS — C61 Malignant neoplasm of prostate: Secondary | ICD-10-CM | POA: Insufficient documentation

## 2012-10-25 LAB — CREATININE, SERUM: GFR calc Af Amer: >90 mL/min (ref >90)

## 2012-10-25 MED ORDER — GADOBENATE DIMEGLUMINE 529 MG/ML IV SOLN
20.0000 mL | Freq: Once | INTRAVENOUS | Status: AC | PRN
  Administered 2012-10-25: 19 mL via INTRAVENOUS

## 2012-11-02 ENCOUNTER — Telehealth: Payer: Self-pay | Admitting: *Deleted

## 2012-11-02 NOTE — Telephone Encounter (Signed)
Pt returned my earlier call.  I explained the purpose of the clinic and encouraged him to talk about it with Dr. Brunilda Payor when he sees him at his appt next Monday s/p a recent MRI.  I indicated I would be mailing him a packet of information for his review and encouraged him to call me with any questions.  Young Berry, RN, BSN, [facility] Prostate Oncology Navigator [phone]

## 2012-11-02 NOTE — Telephone Encounter (Signed)
LVM re: Dr. Madilyn Hook referral for the 9/12 Prostate MDC.  Requested call-back.  Young Berry, RN, BSN, [facility] Prostate Oncology Navigator [phone]

## 2012-11-02 NOTE — Telephone Encounter (Signed)
Pt requesting refill of Flomax, originally prescribed for BPH.  RN left message for Dr Madilyn Hook RN at [facility] to see if this is still appropriate, as the patient has been diagnosed with prostate cancer.

## 2012-11-04 ENCOUNTER — Other Ambulatory Visit: Payer: Self-pay | Admitting: *Deleted

## 2012-11-04 DIAGNOSIS — N400 Enlarged prostate without lower urinary tract symptoms: Secondary | ICD-10-CM

## 2012-11-04 MED ORDER — TAMSULOSIN HCL 0.4 MG PO CAPS: 0.4000 mg | ORAL_CAPSULE | Freq: Every day | ORAL | Status: DC

## 2012-11-04 NOTE — Telephone Encounter (Signed)
OK per [facility].

## 2012-11-08 ENCOUNTER — Telehealth: Payer: Self-pay | Admitting: *Deleted

## 2012-11-11 ENCOUNTER — Encounter: Payer: Self-pay | Admitting: Radiation Oncology

## 2012-11-11 DIAGNOSIS — C61 Malignant neoplasm of prostate: Secondary | ICD-10-CM | POA: Insufficient documentation

## 2012-11-11 NOTE — Progress Notes (Signed)
GU Location of Tumor / Histology: adenocarcinoma of the prostate  If Prostate Cancer, Gleason Score is (4 + 5=9) and PSA is (230)  Patient presented May 2013 with PSA of 147. Sept 2013 and January 2014 PSA 230.  Biopsies of prostate (if applicable) revealed:     Past/Anticipated interventions by urology, if any: Nesi referred patient to [facility]  Past/Anticipated interventions by medical oncology, if any: None  Weight changes, if any: None noted  Bowel/Bladder complaints, if any: urinary frequency, nocturia, difficulty starting urine stream, weak urine stream, urine stream starts and stops, erectile dysfunction, initiating urination requires straining, and hematuria per urinalysis.  Nausea/Vomiting, if any: None noted  Pain issues, if any:  Testicular and back pain improved after months of levaquin  SAFETY ISSUES:  Prior radiation? NO  Pacemaker/ICD? NO  Possible current pregnancy? NO  Is the patient on methotrexate? NO  Current Complaints / other details:  52 year old male. Disabled. Divorced. Prostate volume 77.12 cc. HIV positive.

## 2012-11-12 ENCOUNTER — Ambulatory Visit: Payer: Medicare Other | Admitting: Oncology

## 2012-11-12 ENCOUNTER — Encounter: Payer: Self-pay | Admitting: Radiation Oncology

## 2012-11-12 ENCOUNTER — Ambulatory Visit: Admission: RE | Admit: 2012-11-12 | Payer: Medicare Other | Source: Ambulatory Visit | Admitting: Radiation Oncology

## 2012-11-12 NOTE — Telephone Encounter (Signed)
Pt called to say he cancelled today's f/u appt with Dr. Brunilda Payor and rescheduled for 12/11/12 because "I'm too busy today".  He indicated he was not going to attend the Prostate MDC until he sees Dr. Brunilda Payor and discusses results of a recent MRI.  I called [facility] and informed Dr. Madilyn Hook RN; she indicated [facility] would follow-up.  Young Berry, RN, BSN, [facility] Prostate Oncology Navigator [phone]

## 2012-11-15 ENCOUNTER — Other Ambulatory Visit: Payer: Medicare Other

## 2012-11-15 DIAGNOSIS — B20 Human immunodeficiency virus [HIV] disease: Secondary | ICD-10-CM

## 2012-11-15 DIAGNOSIS — Z113 Encounter for screening for infections with a predominantly sexual mode of transmission: Secondary | ICD-10-CM

## 2012-11-15 DIAGNOSIS — E785 Hyperlipidemia, unspecified: Secondary | ICD-10-CM

## 2012-11-15 LAB — COMPLETE METABOLIC PANEL WITH GFR
ALT: 29 U/L (ref 0–53)
AST: 19 U/L (ref 0–37)
CO2: 28 mEq/L (ref 19–32)
Calcium: 9 mg/dL (ref 8.4–10.5)
Chloride: 104 mEq/L (ref 96–112)
GFR, Est African American: >89 mL/min
Sodium: 139 mEq/L (ref 135–145)
Total Bilirubin: 0.5 mg/dL (ref 0.3–1.2)
Total Protein: 7.2 g/dL (ref 6.0–8.3)

## 2012-11-15 LAB — CBC WITH DIFFERENTIAL/PLATELET
HCT: 40.9 % (ref 39.0–52.0)
Hemoglobin: 14.2 g/dL (ref 13.0–17.0)
Lymphocytes Relative: 35 % (ref 12–46)
Monocytes Absolute: 0.6 10*3/uL (ref 0.1–1.0)
Monocytes Relative: 13 % — ABNORMAL HIGH (ref 3–12)
Neutro Abs: 2.3 10*3/uL (ref 1.7–7.7)
Neutrophils Relative %: 50 % (ref 43–77)
RBC: 4.64 MIL/uL (ref 4.22–5.81)
WBC: 4.7 10*3/uL (ref 4.0–10.5)

## 2012-11-15 LAB — RPR

## 2012-11-15 LAB — LIPID PANEL
HDL: 49 mg/dL (ref >39)
LDL Cholesterol: 170 mg/dL — ABNORMAL HIGH (ref 0–99)
VLDL: 17 mg/dL (ref 0–40)

## 2012-11-16 LAB — HIV-1 RNA QUANT-NO REFLEX-BLD
HIV 1 RNA Quant: <20 copies/mL (ref <20)
HIV-1 RNA Quant, Log: <1.3 {Log} (ref <1.30)

## 2012-11-29 ENCOUNTER — Encounter: Payer: Self-pay | Admitting: *Deleted

## 2012-11-29 ENCOUNTER — Ambulatory Visit (INDEPENDENT_AMBULATORY_CARE_PROVIDER_SITE_OTHER): Payer: Medicare Other | Admitting: Infectious Disease

## 2012-11-29 ENCOUNTER — Encounter: Payer: Self-pay | Admitting: Infectious Disease

## 2012-11-29 VITALS — BP 124/85 | HR 105 | Temp 99.3°F | Wt 205.0 lb

## 2012-11-29 DIAGNOSIS — B20 Human immunodeficiency virus [HIV] disease: Secondary | ICD-10-CM | POA: Diagnosis not present

## 2012-11-29 DIAGNOSIS — E291 Testicular hypofunction: Secondary | ICD-10-CM

## 2012-11-29 DIAGNOSIS — C61 Malignant neoplasm of prostate: Secondary | ICD-10-CM | POA: Diagnosis not present

## 2012-11-29 DIAGNOSIS — Z23 Encounter for immunization: Secondary | ICD-10-CM | POA: Diagnosis not present

## 2012-11-29 DIAGNOSIS — R7989 Other specified abnormal findings of blood chemistry: Secondary | ICD-10-CM

## 2012-11-29 NOTE — Progress Notes (Signed)
Subjective:    Patient ID: Dean Carlson, male    DOB: [birth year], 52 y.o.   MRN: [id number]  HPI   Dean Carlson is a 52y.o. male who is doing superbly well on his antiviral regimen, ATRIPLA with undetectable viral load and health cd4 count.   In the month of May 2013 we had seen Dean Carlson with prostatitis and found his prior prostate-specific antigen to be 149. His prostate was enlarged on exam we gave him levofloxacin for 2 months. The levofloxacin did help with several of his symptoms. His hematuria stopped his testiCular pain resolved and back pain improved as well. When we He checked a prostate-specific antigen in September it was still elevated above 200. I wanted him to be seen by urology but he did not see them for some time.  Ultimately he was seen by Dr. Brunilda Payor and had biopsy confirming dx of prostate cancer.  He most recently had MRI of the pelvis which showed:   Macroscopic tumor diffusely involving the peripheral gland of the  prostate.  Gross extracapsular extension with neurovascular bundle involvement  is present on the left and suspected on the right.  Suspected bilateral seminal vesicle involvement.  Retroperitoneal/pelvic lymphadenopathy  Radiation oncology were trying to set up a treatment plan but the patient had wished to hear from Dr. Brunilda Payor re the MRI results and are not "this has extended outside the prostate gland."  Reviewed the film ports with the patient and it would seem to me that that it has extended outside the prostate and that he does need more than just surgery and he does need multidisciplinary treatment from oncology radiation oncology and urology. I assured him will get back in touch with the radiation oncology team as well as Dr. Brunilda Payor. I would like to get him "plugged in" to a rx plan asap.       Review of Systems  Constitutional: Negative for fever, chills, diaphoresis, activity change, appetite change, fatigue and unexpected weight change.  HENT:  Negative for congestion, sore throat, rhinorrhea, sneezing, trouble swallowing and sinus pressure.   Eyes: Negative for photophobia and visual disturbance.  Respiratory: Negative for cough, chest tightness, shortness of breath, wheezing and stridor.   Cardiovascular: Negative for chest pain, palpitations and leg swelling.  Gastrointestinal: Negative for nausea, vomiting, abdominal pain, diarrhea, constipation, blood in stool, abdominal distention and anal bleeding.  Genitourinary: Negative for dysuria, hematuria, flank pain and difficulty urinating.  Musculoskeletal: Negative for myalgias, back pain, joint swelling, arthralgias and gait problem.  Skin: Negative for color change, pallor, rash and wound.  Neurological: Negative for dizziness, tremors, weakness and light-headedness.  Hematological: Negative for adenopathy. Does not bruise/bleed easily.  Psychiatric/Behavioral: Positive for dysphoric mood and agitation. Negative for behavioral problems, confusion, sleep disturbance and decreased concentration.       Objective:   Physical Exam  Constitutional: He is oriented to person, place, and time. He appears well-developed and well-nourished. No distress.  HENT:  Head: Normocephalic and atraumatic.  Mouth/Throat: Oropharynx is clear and moist. No oropharyngeal exudate.  Eyes: Conjunctivae and EOM are normal. Pupils are equal, round, and reactive to light. No scleral icterus.  Neck: Normal range of motion. Neck supple. No JVD present.  Cardiovascular: Normal rate, regular rhythm and normal heart sounds.  Exam reveals no gallop and no friction rub.   No murmur heard. Pulmonary/Chest: Effort normal and breath sounds normal. No respiratory distress. He has no wheezes. He has no rales. He exhibits no tenderness.  Abdominal: He exhibits  no distension and no mass. There is no tenderness. There is no rebound and no guarding.  Musculoskeletal: He exhibits no edema and no tenderness.   Lymphadenopathy:    He has no cervical adenopathy.  Neurological: He is alert and oriented to person, place, and time. He has normal reflexes. He exhibits normal muscle tone. Coordination normal.  Skin: Skin is warm and dry. He is not diaphoretic. No erythema. No pallor.  Psychiatric: He has a normal mood and affect. His behavior is normal. Judgment and thought content normal.          Assessment & Plan:  HIV: Perfect control continue Atripla  Prostate cancer: needs to see Radiation ONcology and Urology. I spent greater than 45 minutes with the patient including greater than 50% of time in face to face counsel of the patient and in coordination of their care.   HCM: flu shot  Low testosterone; will not obviously rx  This in context of his newly dx prostate cancer  bPH ssx: more likely simply due to prostate cancer.  ? May be helping her

## 2012-11-29 NOTE — Progress Notes (Signed)
Received call from pt indicating that physician at [facility] encouraged him to follow through with referral for Prostate MDC.  I scheduled him for the 12/10/12 clinic with arrival for 8:15.  Pt indicated understanding.  I informed Dr. Madilyn Hook nurse, Windell Moulding, that patient is coming to clinic.  Pt indicated he has the information packet that was previously mailed.  Will contact patient again a couple days prior to clinic to address any questions/concerns.  Young Berry, RN, BSN, [facility] Prostate Oncology Navigator [phone]

## 2012-11-30 ENCOUNTER — Telehealth: Payer: Self-pay | Admitting: *Deleted

## 2012-12-01 NOTE — Telephone Encounter (Signed)
Called pt to let him know that Drs indicated they would like to see him before the 12/10/12 Prostate MDC and that an appt has been scheduled for 12/02/12.  Pt declined appt, indicating  "that would be too stressful to make".  He further indicated that he needed help completing an application for SCAT services.  I indicated I would get back to him with that guidance.  Young Berry, RN, BSN, [facility] Prostate Oncology Navigator [phone]

## 2012-12-02 ENCOUNTER — Ambulatory Visit: Payer: Medicare Other | Admitting: Radiation Oncology

## 2012-12-02 ENCOUNTER — Ambulatory Visit: Payer: Medicare Other

## 2012-12-03 ENCOUNTER — Encounter: Payer: Self-pay | Admitting: *Deleted

## 2012-12-03 NOTE — Progress Notes (Signed)
[facility] Clinical Social Work  Clinical Social Work was referred by patient navigator for assessment of psychosocial needs.  Clinical Social Worker attempted to contact patient at home to offer support and assess for needs.  CSW left vm for Pt. RN Navigator shared Pt had concerns about transportation.      Clinical Social Work interventions: CSW awaits Pt's return call and will definitely meet with Pt at prostate clinic next week.  Doreen Salvage, LCSW Clinical Social Worker [facility] for Patient [facility] Family Support [facility] Wednesday, Thursday and Friday Phone: [phone] Fax: [phone]

## 2012-12-06 ENCOUNTER — Encounter: Payer: Self-pay | Admitting: *Deleted

## 2012-12-06 NOTE — Progress Notes (Signed)
Clinical Social Worker received return phone call from patient regarding transportation.  Pt stated he had arranged transportation for prostate clinic on Friday, but would need assistance for is upcoming treatment appointments.  Pt stated he had filled out a SCAT application and planned to bring it to clinic on Friday.  CSW also informed pt the he may be eligable for [facility] transportation through his Medicaid benefits.  Pt was agreeable to CSW making a referral to [facility].  CSW completed referral and informed pt that [facility] should be in contact with him with in the next 2 days. A member of the CSW team will follow up with the pt at clinic on Friday.  CSW encouraged pt to cal with any questions or concerns.    Tamala Julian, MSW, LCSW Clinical Social Worker [facility] [phone]

## 2012-12-10 ENCOUNTER — Telehealth: Payer: Self-pay | Admitting: *Deleted

## 2012-12-10 ENCOUNTER — Ambulatory Visit
Admission: RE | Admit: 2012-12-10 | Discharge: 2012-12-10 | Disposition: A | Discharge status: Home / Self Care | Payer: Medicare Other | Source: Ambulatory Visit | Attending: Radiation Oncology | Admitting: Radiation Oncology

## 2012-12-10 ENCOUNTER — Ambulatory Visit (HOSPITAL_BASED_OUTPATIENT_CLINIC_OR_DEPARTMENT_OTHER): Payer: Medicare Other | Admitting: Oncology

## 2012-12-10 ENCOUNTER — Encounter: Payer: Self-pay | Admitting: *Deleted

## 2012-12-10 ENCOUNTER — Encounter: Payer: Self-pay | Admitting: Radiation Oncology

## 2012-12-10 ENCOUNTER — Ambulatory Visit: Payer: Medicare Other | Admitting: Radiation Oncology

## 2012-12-10 VITALS — BP 122/82 | HR 94 | Temp 98.6°F | Resp 16 | Ht 70.0 in | Wt 215.2 lb

## 2012-12-10 DIAGNOSIS — R599 Enlarged lymph nodes, unspecified: Secondary | ICD-10-CM

## 2012-12-10 DIAGNOSIS — C61 Malignant neoplasm of prostate: Secondary | ICD-10-CM | POA: Insufficient documentation

## 2012-12-10 DIAGNOSIS — Z87891 Personal history of nicotine dependence: Secondary | ICD-10-CM | POA: Diagnosis not present

## 2012-12-10 DIAGNOSIS — B20 Human immunodeficiency virus [HIV] disease: Secondary | ICD-10-CM | POA: Diagnosis not present

## 2012-12-10 DIAGNOSIS — J45909 Unspecified asthma, uncomplicated: Secondary | ICD-10-CM | POA: Insufficient documentation

## 2012-12-10 DIAGNOSIS — Z21 Asymptomatic human immunodeficiency virus [HIV] infection status: Secondary | ICD-10-CM | POA: Insufficient documentation

## 2012-12-10 NOTE — Consult Note (Signed)
Reason for Referral: Prostate cancer.   HPI: 52 year old gentleman native of the state [place] that have been living in this area since 1992. He was diagnosed with HIV in 1994 and currently on antiviral therapy with his disease under reasonable control. He is currently on Atripla with his viral load being undetectable. He developed prostatitis back in May of 2013 and found to have a PSA of 149 and a repeat PSA in September of this year the PSA of above 200. Patient was referred to Dr. Brunilda Payor and underwent prostate biopsy which showed prostate adenocarcinoma with a Gleason score 4+5 = in all 12 cores that was done in June of 2014. His PSA at that time was 230. His digital rectal examination showed a palpable nodule involving the base MRI of the prostate on 10/25/2012 showed a gross extra capsular extension with neurovascular bundle involvement and suspected retroperitoneal and pelvic lymphadenopathy involvement the largest of which was 13 mm in the left common iliac lymph node. Patient was referred to the prostate cancer multidisciplinary clinic for discussion today.  Clinically, he is a mildly symptomatic at this point. He is not reporting any scars additional symptoms does not report any chest pain or difficulty breathing. Does not report any back pain or shoulder pain or hip pain. He has frequency, nocturia and hesitancy and occasional stranding. He is not reporting any hematuria or dysuria. Does not report any chest pain or difficulty breathing. He still rather functional and uses the bus for transportation.   Past Medical History  Diagnosis Date  . HIV (human immunodeficiency virus infection)   . Asthma     as a child  . Prostate cancer   :  Past Surgical History  Procedure Laterality Date  . Colonoscopy  05/13/2011    Procedure: COLONOSCOPY;  Surgeon: Beverley Fiedler, MD;  Location: [facility];  Service: Gastroenterology;  Laterality: N/A;  . Prostate biopsy  08/16/2012  :  Current  outpatient prescriptions:efavirenz-emtricitabine-tenofovir (ATRIPLA) 600-200-300 MG per tablet, Take 1 tablet by mouth at bedtime., Disp: 90 tablet, Rfl: 3;  levofloxacin (LEVAQUIN) 250 MG tablet, Take 250 mg by mouth daily., Disp: , Rfl: ;  tamsulosin (FLOMAX) 0.4 MG CAPS capsule, Take 1 capsule (0.4 mg total) by mouth at bedtime., Disp: 30 capsule, Rfl: 11:  Allergies  Allergen Reactions  . Alka-Seltzer [Aspirin Effervescent] Other (See Comments)    asthma  . Aspirin-Acetaminophen-Caffeine     REACTION: asthma  :  Family History  Problem Relation Age of Onset  . Diabetes    . Cancer Neg Hx   :  History   Social History  . Marital Status: Legally Separated    Spouse Name: N/A    Number of Children: N/A  . Years of Education: N/A   Occupational History  . permanant disability    Social History Main Topics  . Smoking status: Former Smoker -- 1.00 packs/day    Types: Cigarettes    Quit date: 03/15/2011  . Smokeless tobacco: Never Used  . Alcohol Use: No     Comment: occ  . Drug Use: No  . Sexual Activity: Not on file   Other Topics Concern  . Not on file   Social History Narrative  . No narrative on file  :  A comprehensive review of systems was negative.  Exam: ECOG 0 General appearance: alert, cooperative and appears stated age Head: Normocephalic, without obvious abnormality, atraumatic Eyes: conjunctivae/corneas clear. PERRL, EOM's intact. Fundi benign. Throat: lips, mucosa, and tongue  normal; teeth and gums normal Neck: no adenopathy, no carotid bruit, no JVD, supple, symmetrical, trachea midline and thyroid not enlarged, symmetric, no tenderness/mass/nodules Resp: clear to auscultation bilaterally Cardio: regular rate and rhythm, S1, S2 normal, no murmur, click, rub or gallop GI: soft, non-tender; bowel sounds normal; no masses,  no organomegaly Extremities: extremities normal, atraumatic, no cyanosis or edema Pulses: 2+ and symmetric Skin: Skin color,  texture, turgor normal. No rashes or lesions Neurologic: Grossly normal    Assessment and Plan:   52 year old gentleman with prostate cancer diagnosis confirmed in June of 2014. He has a PSA of 230 as well as biopsy proven Gleason score 4+5 = 9 cancer. He does have extracapsular extension and possible pelvic adenopathy. I had a long discussion with Mr. Farney discussing the natural course of advanced prostate cancer and the treatment options. Given his increased PSA, it is very possible that he has metastatic disease but the bulk of his disease in the pelvic area. After discussion today with the patient as well as the multidisciplinary prostate cancer clinic it was felt that combined modality with androgen depravation and radiation therapy would be the way to go initially. Given the bulk of his disease he will probably develop urinary symptoms and radiation therapy all help alleviate the.  I also discussed with him the role of systemic chemotherapy if he develops bulky disease that includes diffuse lymphadenopathy, multiple bone lesions or any visceral metastasis.  The plan for him is to initiate androgen deprivation with her that we'll be in the form of biochemical castration versus surgical castration which will be discussed with him as well today. He will initiate radiation therapy after that.

## 2012-12-10 NOTE — Progress Notes (Signed)
Please consult note.

## 2012-12-10 NOTE — Telephone Encounter (Signed)
Called patient to inform of appts. At Dr. Brunilda Payor' Office- Lupron Injection on 12-20-12 and his gold seed placement on 02-02-13 at 11:00 am and his sim on 02-04-13 at 9:00 a.m. At Dr. Broadus John Office, lvm for a return call

## 2012-12-10 NOTE — Telephone Encounter (Signed)
Called patient to inform of gold seed placement for 02-07-13 - arrival time - 11:15 am  At Dr. Brunilda Payor' Office and his sim on 02-11-13 at 2 pm, lvm for a return call

## 2012-12-10 NOTE — Progress Notes (Signed)
Met with patient as part of Prostate MDC.  Reintroduced my role as his navigator and encouraged him to call as he proceeds with treatments and appointments at [facility].  Provided the accompanying Care Plan Summary:  Prostate MDC Care Plan Summary  Name: Dean Carlson DOB: 02/15/1961  Your Medical Team:   Urologist - Dr. Sebastian Ache, [facility]eman[facility]  Radiation Oncologist - Dr. Margaretmary Dys  Medical Oncologist - Dr. Eli Hose  Recommendations: 1) Hormone therapy. 2) Radiation therapy. * These recommendations are based on information available as of today's consult.      Recommendations may change depending on the results of further tests or exams.  Next Steps: 1) Follow-up visit with Dr. Brunilda Payor, [facility]. 2) Follow-up visit with Dr. Kathrynn Running, [facility]. When appointments need to be scheduled, you will be contacted by [facility] and/or [facility].      Questions? Please do not hesitate to call Young Berry, RN, BSN, [facility] at [phone] with any questions or concerns.  [name] is Radio broadcast assistant and is available to assist you while you're receiving your medical care at [facility].

## 2012-12-10 NOTE — Progress Notes (Signed)
Radiation Oncology         [phone] ________________________________  Multidisciplinary Prostate Cancer Clinic  Initial Radiation Oncology Consultation  Name: Dean Carlson MRN: [id number]  Date: 12/10/2012  DOB: 10-23-1960  CC:No primary provider on file.  [name], MD   REFERRING PHYSICIAN: [name], MD  DIAGNOSIS: 52 y.o. gentleman with stage T2c N1 adenocarcinoma of the prostate with a Gleason's score of 4+5 and a PSA of 276  HISTORY OF PRESENT ILLNESS::Dean Carlson is a 52 y.o. gentleman.  He was noted to have an elevated PSA of 230 in 1/14 by his primary care physician, Dr. Daiva Eves.  Accordingly, he was referred for evaluation in urology by Dr. Brunilda Payor on 06/21/12,  digital rectal examination was performed at that time revealing a 3+ gland with bilateral nodules and induration.  The patient proceeded to transrectal ultrasound with 12 biopsies of the prostate on 08/16/12.  The prostate volume measured 77.12 cc.  Out of 12 core biopsies, all 12 were positive.  The maximum Gleason score was 4+5.  MRI of the prostate on 10/25/2012 showed a gross extra capsular extension with neurovascular bundle involvement and suspected retroperitoneal and pelvic lymphadenopathy involvement the largest of which was 13 mm in the left common iliac lymph node  The patient reviewed the biopsy results with his urologist and he has kindly been referred today to the multidisciplinary prostate cancer clinic for presentation of pathology and radiology studies in our conference for discussion of potential radiation treatment options and clinical evaluation.  PREVIOUS RADIATION THERAPY: No  PAST MEDICAL HISTORY:  has a past medical history of HIV (human immunodeficiency virus infection); Asthma; and Prostate cancer.    PAST SURGICAL HISTORY: Past Surgical History  Procedure Laterality Date  . Colonoscopy  05/13/2011    Procedure: COLONOSCOPY;  Surgeon: Beverley Fiedler, MD;  Location: [facility];  Service:  Gastroenterology;  Laterality: N/A;  . Prostate biopsy  08/16/2012    FAMILY HISTORY: family history includes Diabetes in an other family member. There is no history of Cancer.  SOCIAL HISTORY:  reports that he quit smoking about 20 months ago. His smoking use included Cigarettes. He smoked 1.00 pack per day. He has never used smokeless tobacco. He reports that he does not drink alcohol or use illicit drugs.  ALLERGIES: Alka-seltzer and Aspirin-acetaminophen-caffeine  MEDICATIONS:  Current Outpatient Prescriptions  Medication Sig Dispense Refill  . efavirenz-emtricitabine-tenofovir (ATRIPLA) 600-200-300 MG per tablet Take 1 tablet by mouth at bedtime.  90 tablet  3  . diazepam (VALIUM) 10 MG tablet       . levofloxacin (LEVAQUIN) 250 MG tablet Take 250 mg by mouth daily.      . tamsulosin (FLOMAX) 0.4 MG CAPS capsule Take 1 capsule (0.4 mg total) by mouth at bedtime.  30 capsule  11   No current facility-administered medications for this encounter.    REVIEW OF SYSTEMS:  A 15 point review of systems is documented in the electronic medical record. This was obtained by the nursing staff. However, I reviewed this with the patient to discuss relevant findings and make appropriate changes.  A comprehensive review of systems was negative..  The patient completed an IPSS and IIEF questionnaire.     PHYSICAL EXAM: This patient is in no acute distress.  He is alert and oriented.   height is 5\' 10"  (1.778 m) and weight is 215 lb 3.2 oz (97.614 kg). His oral temperature is 98.6 F (37 C). His blood pressure is 122/82 and his  pulse is 94. His respiration is 16 and oxygen saturation is 100%.  He exhibits no respiratory distress or labored breathing.  He appears neurologically intact.  His mood is pleasant.  His affect is appropriate.  Please note the digital rectal exam findings described above.  KPS = 100 - Normal; no complaints; no evidence of disease.  LABORATORY DATA:  Lab Results  Component  Value Date   WBC 4.7 11/15/2012   HGB 14.2 11/15/2012   HCT 40.9 11/15/2012   MCV 88.1 11/15/2012   PLT 225 11/15/2012   Lab Results  Component Value Date   NA 139 11/15/2012   K 3.9 11/15/2012   CL 104 11/15/2012   CO2 28 11/15/2012   Lab Results  Component Value Date   ALT 29 11/15/2012   AST 19 11/15/2012   ALKPHOS 94 11/15/2012   BILITOT 0.5 11/15/2012     RADIOGRAPHY:  09/01/12 - bone scan showed no evidence of metastatic disease  10/25/2012-prostate MRI showed gross extracapsular extension the left mid-gland to base with suspected extracapsular extension along the right base in addition to direct extension into the seminal vesicles bilaterally. Pelvic adenopathy was identified with a 13 mm left common iliac node, a 10 mm left external iliac node, 11 mm left pelvic sidewall node, and multiple. Rectal nodes measuring between 14 and 15 mm. No bone metastases were seen on the MR imaging.    IMPRESSION: This gentleman is a 52 y.o. gentleman with stage T2c N1 adenocarcinoma of the prostate with a Gleason's score of 4+5 and a PSA of 276.  His T-Stage, Gleason's Score, and PSA put him into the high risk group.  Accordingly he is eligible for a variety of potential treatment options including external beam radiotherapy with androgen deprivation.  PLAN:Today I reviewed the findings and workup thus far.  We discussed the natural history of prostate cancer.  We reviewed the the implications of T-stage, Gleason's Score, and PSA on decision-making and outcomes in prostate cancer.  We discussed radiation treatment in the management of prostate cancer with regard to the logistics and delivery of external beam radiation treatment as well as the logistics and delivery of prostate brachytherapy.  We compared and contrasted each of these approaches and also compared these against prostatectomy.  The patient expressed interest in external beam radiotherapy.  I filled out a patient counseling form for him with  relevant treatment diagrams and we retained a copy for our records.   The patient would like to proceed with prostate IMRT.  I will share my findings with Dr. Brunilda Payor and move forward with scheduling placement of three gold fiducial markers into the prostate to proceed with IMRT in the near future.     I enjoyed meeting with him today, and will look forward to participating in the care of this very nice gentleman.   I spent 55 minutes face to face with the patient and more than 50% of that time was spent in counseling and/or coordination of care.   ------------------------------------------------  Artist Pais. Kathrynn Running, M.D.

## 2012-12-10 NOTE — Progress Notes (Signed)
[facility] Clinical Social Work Prostate Clinic Clinical Social Work met with Pt at prostate clinic for assessment of psychosocial needs and to review Pt's distress screen.  Pt reports a 5 or moderate distress on the distress screen. Pt shared he has had some issues with food insecurity and receives food stamps of $15 a month. Pt has some issues with riding the bus, but these are not due to physical concerns currently. He brought the SCAT application, but has not filled it out to date. CSW educated Pt that he would need to complete his portion and then CSW could complete. He plans to complete and bring it to his next appointment.  CSW reviewed concerns and shared with resources/support available to assist.   CSW will follow up with Pt on any concerns at his next appointment.   Doreen Salvage, LCSW Clinical Social Worker [facility] for Patient & Family Support [facility] Wednesday, Thursday and Friday Phone: [phone] Fax: [phone]

## 2012-12-16 ENCOUNTER — Encounter: Payer: Self-pay | Admitting: *Deleted

## 2012-12-16 NOTE — Progress Notes (Signed)
[facility] Clinical Social Work  Clinical Social Work phoned [employer] to follow up on [employer] application for services.  Per Pt, he has not received a return call from [facility] to date. They report they are still processing his application. CSW asked to for confirmation that they actually have his information.  They apparently did not get the referral and so, CSW made another referral today and notified the pt.    ,=Grier Reeva Davern, LCSW Clinical Social Worker [facility] Center for Patient & Family Support [facility] Wednesday, Thursday and Friday Phone: [phone] Fax: [phone]

## 2012-12-16 NOTE — Progress Notes (Unsigned)
[facility] Clinical Social Work  Clinical Social Work phoned Pt about [employer] and he reports they DID phone him back and he has plans in place for them to take him to an appt next week. He plans to reconfirm the transportation plans.   Doreen Salvage, LCSW Clinical Social Worker [facility] for Patient & Family Support [facility] Wednesday, Thursday and Friday Phone: [phone] Fax: [phone]

## 2012-12-20 ENCOUNTER — Telehealth: Payer: Self-pay | Admitting: *Deleted

## 2012-12-20 DIAGNOSIS — C61 Malignant neoplasm of prostate: Secondary | ICD-10-CM | POA: Diagnosis not present

## 2012-12-20 NOTE — Telephone Encounter (Signed)
I dont think the flomax is making it difficult for him to urinate. His prostate cancer is doing that. Fine if he wants to stop this to find out if he feels better without it

## 2012-12-20 NOTE — Telephone Encounter (Signed)
Patient stopped Flomax because he said it was making it difficult to urinate. He said he was put on it due to waking up often to urinate in the night. He said he would rather do this than have trouble urinating. Note forwarded to Dr. Daiva Eves. Dean Carlson

## 2013-01-26 DIAGNOSIS — C61 Malignant neoplasm of prostate: Secondary | ICD-10-CM | POA: Diagnosis not present

## 2013-02-04 ENCOUNTER — Other Ambulatory Visit: Payer: Self-pay | Admitting: Radiation Oncology

## 2013-02-07 DIAGNOSIS — C61 Malignant neoplasm of prostate: Secondary | ICD-10-CM | POA: Diagnosis not present

## 2013-02-11 ENCOUNTER — Ambulatory Visit
Admission: RE | Admit: 2013-02-11 | Discharge: 2013-02-11 | Disposition: A | Discharge status: Home / Self Care | Payer: Medicare Other | Source: Ambulatory Visit | Attending: Radiation Oncology | Admitting: Radiation Oncology

## 2013-02-11 ENCOUNTER — Encounter: Payer: Self-pay | Admitting: *Deleted

## 2013-02-11 ENCOUNTER — Encounter: Payer: Self-pay | Admitting: Radiation Oncology

## 2013-02-11 DIAGNOSIS — C61 Malignant neoplasm of prostate: Secondary | ICD-10-CM | POA: Diagnosis not present

## 2013-02-11 DIAGNOSIS — Z510 Encounter for antineoplastic radiation therapy: Secondary | ICD-10-CM | POA: Diagnosis not present

## 2013-02-11 NOTE — Progress Notes (Signed)
  Radiation Oncology         [phone] ________________________________  Name: Dean Carlson  MRN: [id number]  Date: 02/11/2013  DOB: 1960/07/18  SIMULATION AND TREATMENT PLANNING NOTE  DIAGNOSIS:  52 y.o. gentleman with stage T2c N1 adenocarcinoma of the prostate with a Gleason's score of 4+5 and a PSA of 276  NARRATIVE:  The patient was brought to the CT Simulation planning suite.  Identity was confirmed.  All relevant records and images related to the planned course of therapy were reviewed.  The patient freely provided informed written consent to proceed with treatment after reviewing the details related to the planned course of therapy. The consent form was witnessed and verified by the simulation staff.  Then, the patient was set-up in a stable reproducible supine position for radiation therapy.  A vacuum lock pillow device was custom fabricated to position his legs in a reproducible immobilized position.  Then, I performed a urethrogram under sterile conditions to identify the prostatic apex.  CT images were obtained.  Surface markings were placed.  The CT images were loaded into the planning software.  Then the prostate target and avoidance structures including the rectum, bladder, bowel and hips were contoured.  Treatment planning then occurred.  The radiation prescription was entered and confirmed.  A total of one complex treatment devices were fabricated. I have requested : Intensity Modulated Radiotherapy (IMRT) is medically necessary for this case for the following reason:  Rectal sparing.Marland Kitchen  PLAN:  The patient will receive 75 Gy in 40 fractions with 45 Gy to prostate, seminal vesicles and pelvic nodes followed by 30 Gy to prostate only on Tomo.  ________________________________  Artist Pais Kathrynn Running, M.D.

## 2013-02-14 NOTE — Progress Notes (Signed)
To provide support and encouragement, met with patient prior to and after SIM.  Following scan, showed patient Tomo area and explained procedure for arriving for tmts.  Patient indicated understanding.  Young Berry, RN, BSN, [facility] Prostate Oncology Navigator [phone]

## 2013-03-04 DIAGNOSIS — Z510 Encounter for antineoplastic radiation therapy: Secondary | ICD-10-CM | POA: Diagnosis not present

## 2013-03-04 DIAGNOSIS — C61 Malignant neoplasm of prostate: Secondary | ICD-10-CM | POA: Diagnosis not present

## 2013-03-06 DIAGNOSIS — C61 Malignant neoplasm of prostate: Secondary | ICD-10-CM | POA: Diagnosis not present

## 2013-03-06 DIAGNOSIS — Z510 Encounter for antineoplastic radiation therapy: Secondary | ICD-10-CM | POA: Diagnosis not present

## 2013-03-07 ENCOUNTER — Ambulatory Visit
Admission: RE | Admit: 2013-03-07 | Discharge: 2013-03-07 | Disposition: A | Discharge status: Home / Self Care | Payer: Medicare Other | Source: Ambulatory Visit | Attending: Radiation Oncology | Admitting: Radiation Oncology

## 2013-03-07 DIAGNOSIS — Z510 Encounter for antineoplastic radiation therapy: Secondary | ICD-10-CM | POA: Diagnosis not present

## 2013-03-07 DIAGNOSIS — C61 Malignant neoplasm of prostate: Secondary | ICD-10-CM | POA: Diagnosis not present

## 2013-03-08 ENCOUNTER — Ambulatory Visit
Admission: RE | Admit: 2013-03-08 | Discharge: 2013-03-08 | Disposition: A | Discharge status: Home / Self Care | Payer: Medicare Other | Source: Ambulatory Visit | Attending: Radiation Oncology | Admitting: Radiation Oncology

## 2013-03-08 DIAGNOSIS — Z510 Encounter for antineoplastic radiation therapy: Secondary | ICD-10-CM | POA: Diagnosis not present

## 2013-03-08 DIAGNOSIS — C61 Malignant neoplasm of prostate: Secondary | ICD-10-CM | POA: Diagnosis not present

## 2013-03-09 ENCOUNTER — Ambulatory Visit
Admission: RE | Admit: 2013-03-09 | Discharge: 2013-03-09 | Disposition: A | Discharge status: Home / Self Care | Payer: Medicare Other | Source: Ambulatory Visit | Attending: Radiation Oncology | Admitting: Radiation Oncology

## 2013-03-09 DIAGNOSIS — C61 Malignant neoplasm of prostate: Secondary | ICD-10-CM | POA: Diagnosis not present

## 2013-03-09 DIAGNOSIS — Z510 Encounter for antineoplastic radiation therapy: Secondary | ICD-10-CM | POA: Diagnosis not present

## 2013-03-10 ENCOUNTER — Ambulatory Visit
Admission: RE | Admit: 2013-03-10 | Discharge: 2013-03-10 | Disposition: A | Discharge status: Home / Self Care | Payer: Medicare Other | Source: Ambulatory Visit | Attending: Radiation Oncology | Admitting: Radiation Oncology

## 2013-03-10 DIAGNOSIS — C61 Malignant neoplasm of prostate: Secondary | ICD-10-CM | POA: Diagnosis not present

## 2013-03-10 DIAGNOSIS — Z510 Encounter for antineoplastic radiation therapy: Secondary | ICD-10-CM | POA: Diagnosis not present

## 2013-03-11 ENCOUNTER — Ambulatory Visit
Admission: RE | Admit: 2013-03-11 | Discharge: 2013-03-11 | Disposition: A | Discharge status: Home / Self Care | Payer: Medicare Other | Source: Ambulatory Visit | Attending: Radiation Oncology | Admitting: Radiation Oncology

## 2013-03-11 ENCOUNTER — Encounter: Payer: Self-pay | Admitting: *Deleted

## 2013-03-11 ENCOUNTER — Ambulatory Visit: Payer: Medicare Other | Admitting: Radiation Oncology

## 2013-03-11 ENCOUNTER — Telehealth: Payer: Self-pay | Admitting: Nutrition

## 2013-03-11 DIAGNOSIS — Z510 Encounter for antineoplastic radiation therapy: Secondary | ICD-10-CM | POA: Diagnosis not present

## 2013-03-11 DIAGNOSIS — C61 Malignant neoplasm of prostate: Secondary | ICD-10-CM | POA: Diagnosis not present

## 2013-03-11 NOTE — Progress Notes (Signed)
[place] Work  Clinical Social Work was referred by patient for assessment of psychosocial needs due to concerns of food security.   Clinical Social Worker spoke with patient over the phone. He is very interested in Meals on Wheels, but does not qualify due to not meeting the age requirement. He is interested in other food options. He receives food stamps of $15 a month. He is aware of free meals in town, but transportation is a concern. Pt states family support is limited. CSW made referral to dietitian for further follow up with nutritional needs. Pt reports he is coping well and has transportation to appointments currently. He was appreciative and open to referral to dietitian.     Clinical Social Work interventions: Resource and referral  Loren Racer, [place] Social Worker [facility] [place] [facility] [place] Wednesday, Thursday and Friday Phone: [phone] Fax: [phone]

## 2013-03-11 NOTE — Telephone Encounter (Signed)
Received message from social worker that patient may benefit from Ensure Plus.  I called Dean Carlson on the telephone.  He reports he does not usually have trouble with nausea, vomiting, constipation or diarrhea.  He does have difficulty urinating.  Patient denies weight loss.  He has tried ensure in the past and enjoyed it.  Patient would likely benefit from an oral nutrition supplement to provide additional protein.  I will provide patient with one complementary case of Ensure Plus along with information on easily affordable protein sources in his diet.  Patient will have my contact information for future questions or concerns.

## 2013-03-14 ENCOUNTER — Telehealth: Payer: Self-pay | Admitting: Radiation Oncology

## 2013-03-14 ENCOUNTER — Ambulatory Visit
Admission: RE | Admit: 2013-03-14 | Discharge: 2013-03-14 | Disposition: A | Discharge status: Home / Self Care | Payer: Medicare Other | Source: Ambulatory Visit | Attending: Radiation Oncology | Admitting: Radiation Oncology

## 2013-03-14 DIAGNOSIS — C61 Malignant neoplasm of prostate: Secondary | ICD-10-CM

## 2013-03-14 DIAGNOSIS — Z510 Encounter for antineoplastic radiation therapy: Secondary | ICD-10-CM | POA: Diagnosis not present

## 2013-03-14 NOTE — Telephone Encounter (Signed)
Phoned patient today requesting he arrive a few minutes early for appointment to be seen by physician prior to treatment. Patient rides [place] bus and reports he will try his best. Also, patient wants to cancel treatment appointments for January 29, 30, and February 3 because he needs to have labs, get food and pay bills those days. Explained we could work around this and really encouraged him to keep these appointments. Patient insisted this was too much riding the bus which tires him out and he would prefer to tack these appointments on to the end.

## 2013-03-15 ENCOUNTER — Encounter: Payer: Self-pay | Admitting: Radiation Oncology

## 2013-03-15 ENCOUNTER — Ambulatory Visit
Admission: RE | Admit: 2013-03-15 | Discharge: 2013-03-15 | Disposition: A | Discharge status: Home / Self Care | Payer: Medicare Other | Source: Ambulatory Visit | Attending: Radiation Oncology | Admitting: Radiation Oncology

## 2013-03-15 DIAGNOSIS — Z510 Encounter for antineoplastic radiation therapy: Secondary | ICD-10-CM | POA: Diagnosis not present

## 2013-03-15 DIAGNOSIS — C61 Malignant neoplasm of prostate: Secondary | ICD-10-CM | POA: Diagnosis not present

## 2013-03-15 NOTE — Progress Notes (Signed)
  Radiation Oncology         [phone] ________________________________  Name: Dean Carlson  MRN: [id number]  Date: 03/14/2013  DOB: 1961-02-16  Weekly Radiation Therapy Management  Current Dose: Reviewed     Planned Dose:  Approved  Narrative . . . . . . . . The patient presents for routine under treatment assessment at the machine prior to his 6th fraction.                                   The patient is without complaint.                                 Set-up films were reviewed.                                 The chart was checked. Physical Findings. . . No significant changes. Impression . . . . . . . The patient is tolerating radiation. Plan . . . . . . . . . . . . Continue treatment as planned.  ________________________________  Sheral Apley. Tammi Klippel, M.D.

## 2013-03-15 NOTE — Progress Notes (Signed)
Met briefly with patient in [facility] lobby while he was waiting for transportation home.  He stated that tmts going well, did not express any concerns.    Gayleen Orem, RN, BSN, [facility] Prostate Oncology Navigator [phone]

## 2013-03-16 ENCOUNTER — Ambulatory Visit
Admission: RE | Admit: 2013-03-16 | Discharge: 2013-03-16 | Disposition: A | Discharge status: Home / Self Care | Payer: Medicare Other | Source: Ambulatory Visit | Attending: Radiation Oncology | Admitting: Radiation Oncology

## 2013-03-16 ENCOUNTER — Encounter: Payer: Self-pay | Admitting: Radiation Oncology

## 2013-03-16 ENCOUNTER — Ambulatory Visit: Payer: Medicare Other

## 2013-03-16 DIAGNOSIS — C61 Malignant neoplasm of prostate: Secondary | ICD-10-CM

## 2013-03-17 ENCOUNTER — Ambulatory Visit
Admission: RE | Admit: 2013-03-17 | Discharge: 2013-03-17 | Disposition: A | Discharge status: Home / Self Care | Payer: Medicare Other | Source: Ambulatory Visit | Attending: Radiation Oncology | Admitting: Radiation Oncology

## 2013-03-17 DIAGNOSIS — Z510 Encounter for antineoplastic radiation therapy: Secondary | ICD-10-CM | POA: Diagnosis not present

## 2013-03-17 DIAGNOSIS — C61 Malignant neoplasm of prostate: Secondary | ICD-10-CM | POA: Diagnosis not present

## 2013-03-18 ENCOUNTER — Ambulatory Visit
Admission: RE | Admit: 2013-03-18 | Discharge: 2013-03-18 | Disposition: A | Discharge status: Home / Self Care | Payer: Medicare Other | Source: Ambulatory Visit | Attending: Radiation Oncology | Admitting: Radiation Oncology

## 2013-03-18 DIAGNOSIS — C61 Malignant neoplasm of prostate: Secondary | ICD-10-CM | POA: Diagnosis not present

## 2013-03-18 DIAGNOSIS — Z510 Encounter for antineoplastic radiation therapy: Secondary | ICD-10-CM | POA: Diagnosis not present

## 2013-03-21 ENCOUNTER — Encounter: Payer: Self-pay | Admitting: Nutrition

## 2013-03-21 ENCOUNTER — Ambulatory Visit
Admission: RE | Admit: 2013-03-21 | Discharge: 2013-03-21 | Disposition: A | Discharge status: Home / Self Care | Payer: Medicare Other | Source: Ambulatory Visit | Attending: Radiation Oncology | Admitting: Radiation Oncology

## 2013-03-21 DIAGNOSIS — Z510 Encounter for antineoplastic radiation therapy: Secondary | ICD-10-CM | POA: Diagnosis not present

## 2013-03-21 DIAGNOSIS — C61 Malignant neoplasm of prostate: Secondary | ICD-10-CM | POA: Diagnosis not present

## 2013-03-21 NOTE — Progress Notes (Signed)
Provided patient second complimentary case of Ensure Plus

## 2013-03-22 ENCOUNTER — Ambulatory Visit
Admission: RE | Admit: 2013-03-22 | Discharge: 2013-03-22 | Disposition: A | Discharge status: Home / Self Care | Payer: Medicare Other | Source: Ambulatory Visit | Attending: Radiation Oncology | Admitting: Radiation Oncology

## 2013-03-22 DIAGNOSIS — Z510 Encounter for antineoplastic radiation therapy: Secondary | ICD-10-CM | POA: Diagnosis not present

## 2013-03-22 DIAGNOSIS — C61 Malignant neoplasm of prostate: Secondary | ICD-10-CM | POA: Diagnosis not present

## 2013-03-23 ENCOUNTER — Ambulatory Visit: Payer: Medicare Other | Admitting: Radiation Oncology

## 2013-03-23 ENCOUNTER — Ambulatory Visit
Admission: RE | Admit: 2013-03-23 | Discharge: 2013-03-23 | Disposition: A | Discharge status: Home / Self Care | Payer: Medicare Other | Source: Ambulatory Visit | Attending: Radiation Oncology | Admitting: Radiation Oncology

## 2013-03-23 DIAGNOSIS — Z510 Encounter for antineoplastic radiation therapy: Secondary | ICD-10-CM | POA: Diagnosis not present

## 2013-03-23 DIAGNOSIS — C61 Malignant neoplasm of prostate: Secondary | ICD-10-CM | POA: Diagnosis not present

## 2013-03-24 ENCOUNTER — Ambulatory Visit
Admission: RE | Admit: 2013-03-24 | Discharge: 2013-03-24 | Disposition: A | Discharge status: Home / Self Care | Payer: Medicare Other | Source: Ambulatory Visit | Attending: Radiation Oncology | Admitting: Radiation Oncology

## 2013-03-24 DIAGNOSIS — Z510 Encounter for antineoplastic radiation therapy: Secondary | ICD-10-CM | POA: Diagnosis not present

## 2013-03-24 DIAGNOSIS — C61 Malignant neoplasm of prostate: Secondary | ICD-10-CM | POA: Diagnosis not present

## 2013-03-25 ENCOUNTER — Other Ambulatory Visit: Payer: Self-pay | Admitting: *Deleted

## 2013-03-25 ENCOUNTER — Ambulatory Visit
Admission: RE | Admit: 2013-03-25 | Discharge: 2013-03-25 | Disposition: A | Discharge status: Home / Self Care | Payer: Medicare Other | Source: Ambulatory Visit | Attending: Radiation Oncology | Admitting: Radiation Oncology

## 2013-03-25 ENCOUNTER — Encounter: Payer: Self-pay | Admitting: Radiation Oncology

## 2013-03-25 DIAGNOSIS — C61 Malignant neoplasm of prostate: Secondary | ICD-10-CM

## 2013-03-25 DIAGNOSIS — B20 Human immunodeficiency virus [HIV] disease: Secondary | ICD-10-CM

## 2013-03-25 DIAGNOSIS — Z510 Encounter for antineoplastic radiation therapy: Secondary | ICD-10-CM | POA: Diagnosis not present

## 2013-03-25 MED ORDER — EFAVIRENZ-EMTRICITAB-TENOFOVIR 600-200-300 MG PO TABS: 1.0000 | ORAL_TABLET | Freq: Every day | ORAL | Status: DC

## 2013-03-25 NOTE — Progress Notes (Signed)
   Department of Radiation Oncology  Phone:  [phone] Fax:        [phone]  Weekly Treatment Note    Name: Dean Carlson Date: 03/25/2013 MRN: [id number] DOB: 01/03/1961    Current fraction:14   MEDICATIONS: Current Outpatient Prescriptions  Medication Sig Dispense Refill  . diazepam (VALIUM) 10 MG tablet       . efavirenz-emtricitabine-tenofovir (ATRIPLA) 600-200-300 MG per tablet Take 1 tablet by mouth at bedtime.  90 tablet  3  . levofloxacin (LEVAQUIN) 250 MG tablet Take 250 mg by mouth daily.      . tamsulosin (FLOMAX) 0.4 MG CAPS capsule Take 1 capsule (0.4 mg total) by mouth at bedtime.  30 capsule  11   No current facility-administered medications for this encounter.     ALLERGIES: Alka-seltzer and Aspirin-acetaminophen-caffeine   LABORATORY DATA:  Lab Results  Component Value Date   WBC 4.7 11/15/2012   HGB 14.2 11/15/2012   HCT 40.9 11/15/2012   MCV 88.1 11/15/2012   PLT 225 11/15/2012   Lab Results  Component Value Date   NA 139 11/15/2012   K 3.9 11/15/2012   CL 104 11/15/2012   CO2 28 11/15/2012   Lab Results  Component Value Date   ALT 29 11/15/2012   AST 19 11/15/2012   ALKPHOS 94 11/15/2012   BILITOT 0.5 11/15/2012     NARRATIVE: Dean Carlson was seen today for weekly treatment management. The chart was checked and the patient's films were reviewed. The patient was seen the treatment machine today.  He states that he noticed a little blood in his stool a couple of days ago. This has not persisted. He also notices a little bit of tingling in the next but no significant discomfort. He states overall he is doing very well.  PHYSICAL EXAMINATION:   Alert and oriented x3. No acute distress.      ASSESSMENT: The patient is doing satisfactorily with treatment.  PLAN: We will continue with the patient's radiation treatment as planned.

## 2013-03-28 ENCOUNTER — Ambulatory Visit
Admission: RE | Admit: 2013-03-28 | Discharge: 2013-03-28 | Disposition: A | Discharge status: Home / Self Care | Payer: Medicare Other | Source: Ambulatory Visit | Attending: Radiation Oncology | Admitting: Radiation Oncology

## 2013-03-28 DIAGNOSIS — C61 Malignant neoplasm of prostate: Secondary | ICD-10-CM | POA: Diagnosis not present

## 2013-03-28 DIAGNOSIS — Z510 Encounter for antineoplastic radiation therapy: Secondary | ICD-10-CM | POA: Diagnosis not present

## 2013-03-29 ENCOUNTER — Ambulatory Visit: Payer: Medicare Other

## 2013-03-30 ENCOUNTER — Telehealth: Payer: Self-pay | Admitting: Radiation Oncology

## 2013-03-30 ENCOUNTER — Ambulatory Visit
Admission: RE | Admit: 2013-03-30 | Discharge: 2013-03-30 | Disposition: A | Discharge status: Home / Self Care | Payer: Medicare Other | Source: Ambulatory Visit | Attending: Radiation Oncology | Admitting: Radiation Oncology

## 2013-03-30 DIAGNOSIS — Z510 Encounter for antineoplastic radiation therapy: Secondary | ICD-10-CM | POA: Diagnosis not present

## 2013-03-30 DIAGNOSIS — C61 Malignant neoplasm of prostate: Secondary | ICD-10-CM | POA: Diagnosis not present

## 2013-03-30 NOTE — Telephone Encounter (Signed)
Given [employer] paperwork in purple folder by Fletcher Anon. SW notes indicate this paperwork has already been completed. Loren Racer, LCSW in clinic seeing another patient. Questioned her reference this paperwork. She took the paperwork and plans to follow up with [employer] because paperwork has already been done.

## 2013-03-31 ENCOUNTER — Ambulatory Visit: Payer: Medicare Other

## 2013-03-31 ENCOUNTER — Other Ambulatory Visit (INDEPENDENT_AMBULATORY_CARE_PROVIDER_SITE_OTHER): Payer: Medicare Other

## 2013-03-31 ENCOUNTER — Encounter: Payer: Self-pay | Admitting: *Deleted

## 2013-03-31 DIAGNOSIS — Z113 Encounter for screening for infections with a predominantly sexual mode of transmission: Secondary | ICD-10-CM

## 2013-03-31 DIAGNOSIS — B20 Human immunodeficiency virus [HIV] disease: Secondary | ICD-10-CM | POA: Diagnosis not present

## 2013-03-31 LAB — COMPLETE METABOLIC PANEL WITH GFR
ALBUMIN: 4.3 g/dL (ref 3.5–5.2)
ALT: 21 U/L (ref 0–53)
AST: 15 U/L (ref 0–37)
Alkaline Phosphatase: 112 U/L (ref 39–117)
BUN: 13 mg/dL (ref 6–23)
CALCIUM: 9.2 mg/dL (ref 8.4–10.5)
CHLORIDE: 102 meq/L (ref 96–112)
CO2: 29 mEq/L (ref 19–32)
Creat: 0.76 mg/dL (ref 0.50–1.35)
GFR, Est African American: >89 mL/min
GFR, Est Non African American: >89 mL/min
Glucose, Bld: 118 mg/dL — ABNORMAL HIGH (ref 70–99)
POTASSIUM: 4 meq/L (ref 3.5–5.3)
SODIUM: 139 meq/L (ref 135–145)
Total Bilirubin: 0.4 mg/dL (ref 0.2–1.2)
Total Protein: 7.5 g/dL (ref 6.0–8.3)

## 2013-03-31 LAB — CBC WITH DIFFERENTIAL/PLATELET
BASOS ABS: 0 10*3/uL (ref 0.0–0.1)
Basophils Relative: 0 % (ref 0–1)
Eosinophils Absolute: 0.1 10*3/uL (ref 0.0–0.7)
Eosinophils Relative: 4 % (ref 0–5)
HCT: 39.2 % (ref 39.0–52.0)
Hemoglobin: 13.8 g/dL (ref 13.0–17.0)
LYMPHS PCT: 11 % — AB (ref 12–46)
Lymphs Abs: 0.3 10*3/uL — ABNORMAL LOW (ref 0.7–4.0)
MCH: 31.5 pg (ref 26.0–34.0)
MCHC: 35.2 g/dL (ref 30.0–36.0)
MCV: 89.5 fL (ref 78.0–100.0)
Monocytes Absolute: 0.6 10*3/uL (ref 0.1–1.0)
Monocytes Relative: 20 % — ABNORMAL HIGH (ref 3–12)
NEUTROS ABS: 1.8 10*3/uL (ref 1.7–7.7)
Neutrophils Relative %: 65 % (ref 43–77)
PLATELETS: 163 10*3/uL (ref 150–400)
RBC: 4.38 MIL/uL (ref 4.22–5.81)
RDW: 14.4 % (ref 11.5–15.5)
WBC: 2.8 10*3/uL — AB (ref 4.0–10.5)

## 2013-03-31 NOTE — Progress Notes (Signed)
[place] Work  Clinical Social Work was referred by nurse for assessment of psychosocial needs due to recent request for additional transportation.  Clinical Social Worker contacted patient at home to assess for needs.  Pt reports to have [facility] for his medical appointments and this has been working well. He is interested in securing [employer] to assist him in getting to the store and for other errands. Pt is coming back to the [facility] on Monday and will meet with CSW to discuss further and assist in completing the application.     Clinical Social Work interventions: Resource assistance   [employer] Clinical Social Worker [place] [place] [place] for [place] Wednesday, Thursday and Friday Phone: [phone] Fax: [phone]

## 2013-04-01 ENCOUNTER — Ambulatory Visit: Payer: Medicare Other

## 2013-04-01 LAB — T-HELPER CELL (CD4) - (RCID CLINIC ONLY)
CD4 T CELL HELPER: 34 % (ref 33–55)
CD4 T Cell Abs: 200 /uL — ABNORMAL LOW (ref 400–2700)

## 2013-04-03 LAB — HIV-1 RNA QUANT-NO REFLEX-BLD: HIV 1 RNA Quant: <20 copies/mL (ref <20)

## 2013-04-04 ENCOUNTER — Ambulatory Visit
Admission: RE | Admit: 2013-04-04 | Discharge: 2013-04-04 | Disposition: A | Discharge status: Home / Self Care | Payer: Medicare Other | Source: Ambulatory Visit | Attending: Radiation Oncology | Admitting: Radiation Oncology

## 2013-04-04 ENCOUNTER — Encounter: Payer: Self-pay | Admitting: *Deleted

## 2013-04-04 DIAGNOSIS — C61 Malignant neoplasm of prostate: Secondary | ICD-10-CM | POA: Diagnosis not present

## 2013-04-04 DIAGNOSIS — Z510 Encounter for antineoplastic radiation therapy: Secondary | ICD-10-CM | POA: Diagnosis not present

## 2013-04-04 NOTE — Progress Notes (Signed)
[place] Work  Clinical Social Work met with pt at [facility] prior to his radiation appointment to discuss transportation needs.  CSW and pt reviewed SC[employer] application and CSW assisted pt in completing the pt portion.  CSW reviewed and completed part B of the application and submitted application to [employer].  [employer] will contact pt once application is reviewed.  CSW encouraged pt to call with any additional questions or concerns.    Johnnye Lana, MSW, [place] Worker [facility] [phone]

## 2013-04-05 ENCOUNTER — Ambulatory Visit: Payer: Medicare Other

## 2013-04-06 ENCOUNTER — Ambulatory Visit
Admission: RE | Admit: 2013-04-06 | Discharge: 2013-04-06 | Disposition: A | Discharge status: Home / Self Care | Payer: Medicare Other | Source: Ambulatory Visit | Attending: Radiation Oncology | Admitting: Radiation Oncology

## 2013-04-06 DIAGNOSIS — Z510 Encounter for antineoplastic radiation therapy: Secondary | ICD-10-CM | POA: Diagnosis not present

## 2013-04-06 DIAGNOSIS — C61 Malignant neoplasm of prostate: Secondary | ICD-10-CM | POA: Diagnosis not present

## 2013-04-07 ENCOUNTER — Ambulatory Visit
Admission: RE | Admit: 2013-04-07 | Discharge: 2013-04-07 | Disposition: A | Discharge status: Home / Self Care | Payer: Medicare Other | Source: Ambulatory Visit | Attending: Radiation Oncology | Admitting: Radiation Oncology

## 2013-04-07 DIAGNOSIS — Z510 Encounter for antineoplastic radiation therapy: Secondary | ICD-10-CM | POA: Diagnosis not present

## 2013-04-07 DIAGNOSIS — C61 Malignant neoplasm of prostate: Secondary | ICD-10-CM | POA: Diagnosis not present

## 2013-04-08 ENCOUNTER — Encounter: Payer: Self-pay | Admitting: Radiation Oncology

## 2013-04-08 ENCOUNTER — Encounter: Payer: Self-pay | Admitting: Nutrition

## 2013-04-08 ENCOUNTER — Ambulatory Visit
Admission: RE | Admit: 2013-04-08 | Discharge: 2013-04-08 | Disposition: A | Discharge status: Home / Self Care | Payer: Medicare Other | Source: Ambulatory Visit | Attending: Radiation Oncology | Admitting: Radiation Oncology

## 2013-04-08 VITALS — BP 135/78 | HR 88 | Resp 16 | Wt 218.6 lb

## 2013-04-08 DIAGNOSIS — Z510 Encounter for antineoplastic radiation therapy: Secondary | ICD-10-CM | POA: Diagnosis not present

## 2013-04-08 DIAGNOSIS — C61 Malignant neoplasm of prostate: Secondary | ICD-10-CM

## 2013-04-08 NOTE — Progress Notes (Signed)
Patient reports that his rectum is sore. Patient reports soft to pasty frequent stools. Patient reports that two day this week he saw blood in his stool. He assumes the blood in the stool is from irritation of his internal hemorrhoid. Patient reports that he is hungry all the time. Contacted nutritionist, Dory Peru, on patient's behalf to see if she had another complimentary case of ensure for him. Patient reports fatigue. Patient denies hematuria or dysuria. Reports nocturia x 2.

## 2013-04-08 NOTE — Progress Notes (Signed)
Provided patient with third, and final case of Ensure Plus.

## 2013-04-11 ENCOUNTER — Ambulatory Visit
Admission: RE | Admit: 2013-04-11 | Discharge: 2013-04-11 | Disposition: A | Discharge status: Home / Self Care | Payer: Medicare Other | Source: Ambulatory Visit | Attending: Radiation Oncology | Admitting: Radiation Oncology

## 2013-04-11 DIAGNOSIS — Z510 Encounter for antineoplastic radiation therapy: Secondary | ICD-10-CM | POA: Diagnosis not present

## 2013-04-11 DIAGNOSIS — C61 Malignant neoplasm of prostate: Secondary | ICD-10-CM | POA: Diagnosis not present

## 2013-04-12 ENCOUNTER — Ambulatory Visit
Admission: RE | Admit: 2013-04-12 | Discharge: 2013-04-12 | Disposition: A | Discharge status: Home / Self Care | Payer: Medicare Other | Source: Ambulatory Visit | Attending: Radiation Oncology | Admitting: Radiation Oncology

## 2013-04-12 DIAGNOSIS — C61 Malignant neoplasm of prostate: Secondary | ICD-10-CM | POA: Diagnosis not present

## 2013-04-12 DIAGNOSIS — Z510 Encounter for antineoplastic radiation therapy: Secondary | ICD-10-CM | POA: Diagnosis not present

## 2013-04-13 ENCOUNTER — Ambulatory Visit: Payer: Medicare Other | Admitting: Infectious Disease

## 2013-04-13 ENCOUNTER — Ambulatory Visit: Payer: Medicare Other | Admitting: Radiation Oncology

## 2013-04-13 ENCOUNTER — Ambulatory Visit: Payer: Medicare Other

## 2013-04-14 ENCOUNTER — Ambulatory Visit
Admission: RE | Admit: 2013-04-14 | Discharge: 2013-04-14 | Disposition: A | Discharge status: Home / Self Care | Payer: Medicare Other | Source: Ambulatory Visit | Attending: Radiation Oncology | Admitting: Radiation Oncology

## 2013-04-14 ENCOUNTER — Encounter: Payer: Self-pay | Admitting: *Deleted

## 2013-04-14 DIAGNOSIS — C61 Malignant neoplasm of prostate: Secondary | ICD-10-CM | POA: Diagnosis not present

## 2013-04-14 DIAGNOSIS — Z510 Encounter for antineoplastic radiation therapy: Secondary | ICD-10-CM | POA: Diagnosis not present

## 2013-04-14 NOTE — Progress Notes (Signed)
Spoke with patient in [facility] waiting area prior to his tmt.  He indicated he is experiencing some ambulatory discomfort r/t tmts but otherwise feeling well.  He stated his tmt timeframe has been adjusted d/t missed appts.  I encouraged him to call me for any assistance.  He verbalized understanding.  Gayleen Orem, RN, BSN, [facility] Prostate Oncology Navigator [phone]

## 2013-04-15 ENCOUNTER — Ambulatory Visit: Payer: Medicare Other

## 2013-04-18 ENCOUNTER — Ambulatory Visit: Payer: Medicare Other

## 2013-04-18 ENCOUNTER — Ambulatory Visit (INDEPENDENT_AMBULATORY_CARE_PROVIDER_SITE_OTHER): Payer: Medicare Other | Admitting: Infectious Disease

## 2013-04-18 ENCOUNTER — Encounter: Payer: Self-pay | Admitting: Infectious Disease

## 2013-04-18 VITALS — BP 118/83 | HR 89 | Temp 98.5°F | Wt 205.0 lb

## 2013-04-18 DIAGNOSIS — D72819 Decreased white blood cell count, unspecified: Secondary | ICD-10-CM | POA: Diagnosis not present

## 2013-04-18 DIAGNOSIS — C61 Malignant neoplasm of prostate: Secondary | ICD-10-CM

## 2013-04-18 DIAGNOSIS — B20 Human immunodeficiency virus [HIV] disease: Secondary | ICD-10-CM

## 2013-04-18 DIAGNOSIS — R599 Enlarged lymph nodes, unspecified: Secondary | ICD-10-CM | POA: Diagnosis not present

## 2013-04-18 NOTE — Progress Notes (Signed)
  Subjective:    Patient ID: Dean Carlson, male    DOB: [birth year], 53 y.o.   MRN: [id number]  HPI   Dean Carlson is a 53 y.o. male who is doing superbly well on his antiviral regimen, ATRIPLA with undetectable viral load   He has been diagnosed with T2c N1 adenocarcinoma of the prostate with a Gleason's score of 4+5 which put him in high risk group. He is currently undergoing chemical castration along with XRT by Dr. Tammi Klippel  His CD4 came down to 200 with his total wbc also coming down, ? Due to XRT. He has not specific complaints today.    Review of Systems  Constitutional: Negative for fever, chills, diaphoresis, activity change, appetite change, fatigue and unexpected weight change.  HENT: Negative for congestion, rhinorrhea, sinus pressure, sneezing, sore throat and trouble swallowing.   Eyes: Negative for photophobia and visual disturbance.  Respiratory: Negative for cough, chest tightness, shortness of breath, wheezing and stridor.   Cardiovascular: Negative for chest pain, palpitations and leg swelling.  Gastrointestinal: Negative for nausea, vomiting, abdominal pain, diarrhea, constipation, blood in stool, abdominal distention and anal bleeding.  Genitourinary: Negative for dysuria, hematuria, flank pain and difficulty urinating.  Musculoskeletal: Negative for arthralgias, back pain, gait problem, joint swelling and myalgias.  Skin: Negative for color change, pallor, rash and wound.  Neurological: Negative for dizziness, tremors, weakness and light-headedness.  Hematological: Negative for adenopathy. Does not bruise/bleed easily.  Psychiatric/Behavioral: Negative for behavioral problems, confusion, sleep disturbance and decreased concentration.       Objective:   Physical Exam  Constitutional: He is oriented to person, place, and time. He appears well-developed and well-nourished. No distress.  HENT:  Head: Normocephalic and atraumatic.  Mouth/Throat: Oropharynx is clear  and moist. No oropharyngeal exudate.  Eyes: Conjunctivae and EOM are normal. Pupils are equal, round, and reactive to light. No scleral icterus.  Neck: Normal range of motion. Neck supple. No JVD present.  Cardiovascular: Normal rate, regular rhythm and normal heart sounds.  Exam reveals no gallop and no friction rub.   No murmur heard. Pulmonary/Chest: Effort normal and breath sounds normal. No respiratory distress. He has no wheezes. He has no rales. He exhibits no tenderness.  Abdominal: He exhibits no distension and no mass. There is no tenderness. There is no rebound and no guarding.  Musculoskeletal: He exhibits no edema and no tenderness.  Lymphadenopathy:    He has no cervical adenopathy.  Neurological: He is alert and oriented to person, place, and time. He exhibits normal muscle tone. Coordination normal.  Skin: Skin is warm and dry. He is not diaphoretic. No erythema. No pallor.  Psychiatric: He has a normal mood and affect. His behavior is normal. Thought content normal.          Assessment & Plan:  HIV: Perfect control continue Atripla.   Low WBC: with lower CD4, could this be due to his XRT? Or new medicine? If his CD4 consistently goes below 200 will need to chart Bactrim despite the fact that he is perfectly apparent his antiretroviral medications will send query to his oncologist and radiation oncologist.  Prostate cancer: on anti-androgen therapy and XRT being followed closely by Oncology and Rad/Oncology

## 2013-04-19 ENCOUNTER — Ambulatory Visit: Payer: Medicare Other

## 2013-04-20 ENCOUNTER — Ambulatory Visit: Payer: Medicare Other

## 2013-04-20 ENCOUNTER — Ambulatory Visit: Payer: Medicare Other | Admitting: Radiation Oncology

## 2013-04-21 ENCOUNTER — Ambulatory Visit: Payer: Medicare Other

## 2013-04-22 ENCOUNTER — Ambulatory Visit: Payer: Medicare Other

## 2013-04-25 ENCOUNTER — Ambulatory Visit
Admission: RE | Admit: 2013-04-25 | Discharge: 2013-04-25 | Disposition: A | Discharge status: Home / Self Care | Payer: Medicare Other | Source: Ambulatory Visit | Attending: Radiation Oncology | Admitting: Radiation Oncology

## 2013-04-25 ENCOUNTER — Telehealth: Payer: Self-pay | Admitting: Radiation Oncology

## 2013-04-25 ENCOUNTER — Encounter: Payer: Self-pay | Admitting: *Deleted

## 2013-04-25 NOTE — Telephone Encounter (Signed)
Patient phoned tomo again concerned about transportation. Phoned patient back to let him know that Polo Riley, social worker, is attempting to reach him to resolve transportation issues. No answer. Left message. Awaiting return call.

## 2013-04-25 NOTE — Progress Notes (Signed)
[place] Work  Clinical Social Work received voicemail from patient and spoke with RN regarding patient's transportation issues.  Patient currently using [facility] transportation, [place] recently closed and patient unable to find transportation.  CSW and patient explored many possible options- CSW will provide patient with a 20-trip (10 round trip rides) [employer] card once patient is able to get to [facility].  CSW encouraged patient to utilize [employer] bus system, however patient states he is unable to afford bus fare.  Patient plans to set up [employer] appointments for this coming Monday 05/02/13.  CSW discussed with radiation therapist.  Patient plans to pick up SCAT passes from [place] on 05/02/13.  Polo Riley, MSW, LCSW, OSW-C Clinical Social Worker [facility] [phone]

## 2013-04-25 NOTE — Progress Notes (Signed)
Patient was being transported to and from treatment via [facility]. Because [facility] is no longer an option for transportation patient reached out to [employer] but, can't afford the fees involved. Contact Lauren Mullis reference this matter and she plans to contact the patient. Informed Miranda, RT on tomo of these findings.

## 2013-04-26 ENCOUNTER — Ambulatory Visit: Payer: Medicare Other

## 2013-04-27 ENCOUNTER — Ambulatory Visit: Payer: Medicare Other

## 2013-04-28 ENCOUNTER — Ambulatory Visit: Payer: Medicare Other

## 2013-04-29 ENCOUNTER — Ambulatory Visit: Payer: Medicare Other

## 2013-05-02 ENCOUNTER — Ambulatory Visit
Admission: RE | Admit: 2013-05-02 | Discharge: 2013-05-02 | Disposition: A | Discharge status: Home / Self Care | Payer: Medicare Other | Source: Ambulatory Visit | Attending: Radiation Oncology | Admitting: Radiation Oncology

## 2013-05-02 ENCOUNTER — Encounter: Payer: Self-pay | Admitting: *Deleted

## 2013-05-02 ENCOUNTER — Encounter: Payer: Self-pay | Admitting: Radiation Oncology

## 2013-05-02 VITALS — BP 120/65 | HR 88 | Resp 16 | Wt 218.9 lb

## 2013-05-02 DIAGNOSIS — C61 Malignant neoplasm of prostate: Secondary | ICD-10-CM | POA: Diagnosis not present

## 2013-05-02 DIAGNOSIS — Z510 Encounter for antineoplastic radiation therapy: Secondary | ICD-10-CM | POA: Diagnosis not present

## 2013-05-02 NOTE — Progress Notes (Signed)
Patient hasn't been treated with radiation therapy in two weeks. Patient rode [facility] but, they have closed. Patient has been working with Dean Riley, LCSW to establish ride with [employer]. Denies dysuria or hematuria. Reports occasional diarrhea. Reports two episode of blood in stool related to inner hemorrhoid.  Reports manageable fatigue. Reports he saw his PCP who informed him he has "low T cells." Reports nocturia x 4. Reports a strong steady stream. Denies incontinence. Reports urgency.

## 2013-05-02 NOTE — Progress Notes (Signed)
Clinical Social Worker met with patient in radiation oncology treatment area.  CSW provided patient with 20 [employer] ride ticket.  Patient expressed understanding that he will be responsible for paying for additional [employer] rides.  CSW assisted patient in calculating how much money he will need to save in order to finish radiation treatments.  Polo Riley, MSW, LCSW, OSW-C Clinical Social Worker [facility] [phone]

## 2013-05-03 ENCOUNTER — Ambulatory Visit
Admission: RE | Admit: 2013-05-03 | Discharge: 2013-05-03 | Disposition: A | Discharge status: Home / Self Care | Payer: Medicare Other | Source: Ambulatory Visit | Attending: Radiation Oncology | Admitting: Radiation Oncology

## 2013-05-03 DIAGNOSIS — Z510 Encounter for antineoplastic radiation therapy: Secondary | ICD-10-CM | POA: Diagnosis not present

## 2013-05-03 DIAGNOSIS — C61 Malignant neoplasm of prostate: Secondary | ICD-10-CM | POA: Diagnosis not present

## 2013-05-03 NOTE — Progress Notes (Signed)
   Department of Radiation Oncology  Phone:  [phone] Fax:        [phone]  Weekly Treatment Note    Name: Dean Carlson Date: 05/03/2013 MRN: [id number] DOB: [birth year]   Current dose: 43.2 Gy  Current fraction: 24   MEDICATIONS: Current Outpatient Prescriptions  Medication Sig Dispense Refill  . diazepam (VALIUM) 10 MG tablet       . efavirenz-emtricitabine-tenofovir (ATRIPLA) 600-200-300 MG per tablet Take 1 tablet by mouth at bedtime.  90 tablet  3  . tamsulosin (FLOMAX) 0.4 MG CAPS capsule Take 1 capsule (0.4 mg total) by mouth at bedtime.  30 capsule  11   No current facility-administered medications for this encounter.     ALLERGIES: Alka-seltzer and Aspirin-acetaminophen-caffeine   LABORATORY DATA:  Lab Results  Component Value Date   WBC 2.8* 03/31/2013   HGB 13.8 03/31/2013   HCT 39.2 03/31/2013   MCV 89.5 03/31/2013   PLT 163 03/31/2013   Lab Results  Component Value Date   NA 139 03/31/2013   K 4.0 03/31/2013   CL 102 03/31/2013   CO2 29 03/31/2013   Lab Results  Component Value Date   ALT 21 03/31/2013   AST 15 03/31/2013   ALKPHOS 112 03/31/2013   BILITOT 0.4 03/31/2013     NARRATIVE: Dean Carlson was seen today for weekly treatment management. The chart was checked and the patient's films were reviewed. The patient has we started his radiation treatment. He has been unable to come to treatment the last couple of weeks due to transportation issues. He has worked with the Education officer, museum and he has established transportation he believes going forward. He has no complaints today with regards to his treatment. No dysuria. No nausea/diarrhea.  PHYSICAL EXAMINATION: weight is 218 lb 14.4 oz (99.292 kg). His blood pressure is 120/65 and his pulse is 88. His respiration is 16.        ASSESSMENT: The patient is doing satisfactorily with treatment.  PLAN: We will continue with the patient's radiation treatment as planned. We discussed his transportation  issues. I stressed to him the importance of continuous radiation treatment and he expressed an understanding of this issue. The patient does have HIV and he states that his T-cell count has decreased. I discussed with them the role of a boost treatment which she will begin in the near future which will be more targeted to the prostate area. I would not expect a major change in his T-cell count with his radiation treatment, but this effect will continue to decrease as she proceeds through treatment.

## 2013-05-04 ENCOUNTER — Ambulatory Visit
Admission: RE | Admit: 2013-05-04 | Discharge: 2013-05-04 | Disposition: A | Discharge status: Home / Self Care | Payer: Medicare Other | Source: Ambulatory Visit | Attending: Radiation Oncology | Admitting: Radiation Oncology

## 2013-05-04 DIAGNOSIS — Z510 Encounter for antineoplastic radiation therapy: Secondary | ICD-10-CM | POA: Diagnosis not present

## 2013-05-04 DIAGNOSIS — C61 Malignant neoplasm of prostate: Secondary | ICD-10-CM | POA: Diagnosis not present

## 2013-05-05 ENCOUNTER — Encounter: Payer: Self-pay | Admitting: Radiation Oncology

## 2013-05-05 ENCOUNTER — Inpatient Hospital Stay
Admission: RE | Admit: 2013-05-05 | Discharge: 2013-05-05 | Disposition: A | Discharge status: Home / Self Care | Payer: Medicare Other | Source: Ambulatory Visit | Attending: Radiation Oncology | Admitting: Radiation Oncology

## 2013-05-05 ENCOUNTER — Ambulatory Visit
Admission: RE | Admit: 2013-05-05 | Discharge: 2013-05-05 | Disposition: A | Discharge status: Home / Self Care | Payer: Medicare Other | Source: Ambulatory Visit | Attending: Radiation Oncology | Admitting: Radiation Oncology

## 2013-05-05 VITALS — BP 133/91 | HR 98 | Resp 16 | Wt 213.0 lb

## 2013-05-05 DIAGNOSIS — Z510 Encounter for antineoplastic radiation therapy: Secondary | ICD-10-CM | POA: Diagnosis not present

## 2013-05-05 DIAGNOSIS — C61 Malignant neoplasm of prostate: Secondary | ICD-10-CM

## 2013-05-05 NOTE — Progress Notes (Signed)
Discussed issues causes stress in patient's life. Discussed his spiritual faith. Listened and offered encouragement. Patient expressed need for social interaction. Polo Riley, LCS plans to reach out to this patient.

## 2013-05-05 NOTE — Progress Notes (Signed)
Denies dysuria or hematuria. Reports occasional diarrhea. Reports two episode of blood in stool related to inner hemorrhoid. Reports manageable fatigue. Reports he saw his PCP who informed him he has "low T cells." Reports nocturia x 4. Reports a strong steady stream. Denies incontinence. Reports urgency.

## 2013-05-05 NOTE — Progress Notes (Signed)
  Radiation Oncology         [phone] ________________________________  Name: Dean Carlson MRN: [id number]  Date: 05/05/2013  DOB: 01-Jan-1961  Weekly Radiation Therapy Management  Current Dose: 47 Gy     Planned Dose:  75 Gy  Narrative . . . . . . . . The patient presents for routine under treatment assessment.  CD4 count decreased to 200, with undetectable viral load, implicating some pelvic bone marrow suppression from radiotherapy, which will be temporary.                                   The patient is without complaint.                                 Set-up films were reviewed.                                 The chart was checked. Physical Findings. . .  weight is 213 lb (96.616 kg). His blood pressure is 133/91 and his pulse is 98. His respiration is 16. . Weight essentially stable.  No significant changes. Impression . . . . . . . The patient is tolerating radiation. Plan . . . . . . . . . . . . Continue treatment as planned.  ________________________________  Sheral Apley. Tammi Klippel, M.D.

## 2013-05-06 ENCOUNTER — Ambulatory Visit
Admission: RE | Admit: 2013-05-06 | Discharge: 2013-05-06 | Disposition: A | Discharge status: Home / Self Care | Payer: Medicare Other | Source: Ambulatory Visit | Attending: Radiation Oncology | Admitting: Radiation Oncology

## 2013-05-06 ENCOUNTER — Ambulatory Visit: Payer: Medicare Other

## 2013-05-06 DIAGNOSIS — C61 Malignant neoplasm of prostate: Secondary | ICD-10-CM | POA: Diagnosis not present

## 2013-05-06 DIAGNOSIS — Z510 Encounter for antineoplastic radiation therapy: Secondary | ICD-10-CM | POA: Diagnosis not present

## 2013-05-09 ENCOUNTER — Ambulatory Visit
Admission: RE | Admit: 2013-05-09 | Discharge: 2013-05-09 | Disposition: A | Discharge status: Home / Self Care | Payer: Medicare Other | Source: Ambulatory Visit | Attending: Radiation Oncology | Admitting: Radiation Oncology

## 2013-05-09 ENCOUNTER — Ambulatory Visit: Payer: Medicare Other

## 2013-05-09 DIAGNOSIS — Z510 Encounter for antineoplastic radiation therapy: Secondary | ICD-10-CM | POA: Diagnosis not present

## 2013-05-09 DIAGNOSIS — C61 Malignant neoplasm of prostate: Secondary | ICD-10-CM | POA: Diagnosis not present

## 2013-05-10 ENCOUNTER — Ambulatory Visit
Admission: RE | Admit: 2013-05-10 | Discharge: 2013-05-10 | Disposition: A | Discharge status: Home / Self Care | Payer: Medicare Other | Source: Ambulatory Visit | Attending: Radiation Oncology | Admitting: Radiation Oncology

## 2013-05-10 ENCOUNTER — Ambulatory Visit: Payer: Medicare Other

## 2013-05-10 DIAGNOSIS — Z510 Encounter for antineoplastic radiation therapy: Secondary | ICD-10-CM | POA: Diagnosis not present

## 2013-05-10 DIAGNOSIS — C61 Malignant neoplasm of prostate: Secondary | ICD-10-CM | POA: Diagnosis not present

## 2013-05-11 ENCOUNTER — Ambulatory Visit
Admission: RE | Admit: 2013-05-11 | Discharge: 2013-05-11 | Disposition: A | Discharge status: Home / Self Care | Payer: Medicare Other | Source: Ambulatory Visit | Attending: Radiation Oncology | Admitting: Radiation Oncology

## 2013-05-11 ENCOUNTER — Ambulatory Visit: Payer: Medicare Other

## 2013-05-11 DIAGNOSIS — C61 Malignant neoplasm of prostate: Secondary | ICD-10-CM | POA: Diagnosis not present

## 2013-05-11 DIAGNOSIS — Z510 Encounter for antineoplastic radiation therapy: Secondary | ICD-10-CM | POA: Diagnosis not present

## 2013-05-12 ENCOUNTER — Ambulatory Visit
Admission: RE | Admit: 2013-05-12 | Discharge: 2013-05-12 | Disposition: A | Discharge status: Home / Self Care | Payer: Medicare Other | Source: Ambulatory Visit | Attending: Radiation Oncology | Admitting: Radiation Oncology

## 2013-05-12 ENCOUNTER — Ambulatory Visit: Payer: Medicare Other

## 2013-05-12 DIAGNOSIS — Z510 Encounter for antineoplastic radiation therapy: Secondary | ICD-10-CM | POA: Diagnosis not present

## 2013-05-12 DIAGNOSIS — C61 Malignant neoplasm of prostate: Secondary | ICD-10-CM | POA: Insufficient documentation

## 2013-05-13 ENCOUNTER — Ambulatory Visit
Admission: RE | Admit: 2013-05-13 | Discharge: 2013-05-13 | Disposition: A | Discharge status: Home / Self Care | Payer: Medicare Other | Source: Ambulatory Visit | Attending: Radiation Oncology | Admitting: Radiation Oncology

## 2013-05-13 ENCOUNTER — Encounter: Payer: Self-pay | Admitting: Radiation Oncology

## 2013-05-13 ENCOUNTER — Ambulatory Visit: Payer: Medicare Other

## 2013-05-13 DIAGNOSIS — C61 Malignant neoplasm of prostate: Secondary | ICD-10-CM | POA: Diagnosis not present

## 2013-05-13 DIAGNOSIS — Z510 Encounter for antineoplastic radiation therapy: Secondary | ICD-10-CM | POA: Diagnosis not present

## 2013-05-13 NOTE — Progress Notes (Signed)
  Radiation Oncology         [phone] ________________________________  Name: Ugochukwu Chichester MRN: [id number]  Date: 05/13/2013  DOB: [birth year]  Weekly Radiation Therapy Management  Current Dose: 59.4 Gy     Planned Dose:  68.4 Gy  Narrative . . . . . . . . The patient presents for routine under treatment assessment.                                   The patient is without complaint.                                 Set-up films were reviewed.                                 The chart was checked. Physical Findings. . . . Weight essentially stable.  No significant changes. Impression . . . . . . . The patient is tolerating radiation. Plan . . . . . . . . . . . . Continue treatment as planned.  ________________________________  Sheral Apley. Tammi Klippel, M.D.

## 2013-05-16 ENCOUNTER — Ambulatory Visit
Admission: RE | Admit: 2013-05-16 | Discharge: 2013-05-16 | Disposition: A | Discharge status: Home / Self Care | Payer: Medicare Other | Source: Ambulatory Visit | Attending: Radiation Oncology | Admitting: Radiation Oncology

## 2013-05-16 DIAGNOSIS — C61 Malignant neoplasm of prostate: Secondary | ICD-10-CM | POA: Diagnosis not present

## 2013-05-16 DIAGNOSIS — Z510 Encounter for antineoplastic radiation therapy: Secondary | ICD-10-CM | POA: Diagnosis not present

## 2013-05-17 ENCOUNTER — Ambulatory Visit
Admission: RE | Admit: 2013-05-17 | Discharge: 2013-05-17 | Disposition: A | Discharge status: Home / Self Care | Payer: Medicare Other | Source: Ambulatory Visit | Attending: Radiation Oncology | Admitting: Radiation Oncology

## 2013-05-17 ENCOUNTER — Ambulatory Visit: Payer: Medicare Other

## 2013-05-17 DIAGNOSIS — C61 Malignant neoplasm of prostate: Secondary | ICD-10-CM | POA: Diagnosis not present

## 2013-05-17 DIAGNOSIS — Z510 Encounter for antineoplastic radiation therapy: Secondary | ICD-10-CM | POA: Diagnosis not present

## 2013-05-18 ENCOUNTER — Ambulatory Visit: Payer: Medicare Other

## 2013-05-18 ENCOUNTER — Ambulatory Visit
Admission: RE | Admit: 2013-05-18 | Discharge: 2013-05-18 | Disposition: A | Discharge status: Home / Self Care | Payer: Medicare Other | Source: Ambulatory Visit | Attending: Radiation Oncology | Admitting: Radiation Oncology

## 2013-05-18 DIAGNOSIS — C61 Malignant neoplasm of prostate: Secondary | ICD-10-CM | POA: Diagnosis not present

## 2013-05-18 DIAGNOSIS — Z510 Encounter for antineoplastic radiation therapy: Secondary | ICD-10-CM | POA: Diagnosis not present

## 2013-05-19 ENCOUNTER — Encounter: Payer: Self-pay | Admitting: Radiation Oncology

## 2013-05-19 ENCOUNTER — Ambulatory Visit
Admission: RE | Admit: 2013-05-19 | Discharge: 2013-05-19 | Disposition: A | Discharge status: Home / Self Care | Payer: Medicare Other | Source: Ambulatory Visit | Attending: Radiation Oncology | Admitting: Radiation Oncology

## 2013-05-19 VITALS — BP 102/60 | HR 83 | Temp 98.6°F | Resp 20 | Wt 213.2 lb

## 2013-05-19 DIAGNOSIS — Z510 Encounter for antineoplastic radiation therapy: Secondary | ICD-10-CM | POA: Diagnosis not present

## 2013-05-19 DIAGNOSIS — C61 Malignant neoplasm of prostate: Secondary | ICD-10-CM | POA: Diagnosis not present

## 2013-05-19 NOTE — Progress Notes (Signed)
Weekly rad tx prostate 37/40, slight dysuria again, loose stools,  Nocturia 10-11 x last night, not drinking enough water,  Dark urine,  ate steak last night first time in long time, feels that messed his digestion up and caused the loose stools, no hematuria, tired, stayed up late last night also on phone 11:39 AM

## 2013-05-19 NOTE — Addendum Note (Signed)
Encounter addended by: Rebecca Eaton, RN on: 05/19/2013 11:58 AM<BR>     Documentation filed: Inpatient Document Flowsheet

## 2013-05-19 NOTE — Progress Notes (Signed)
  Radiation Oncology         [phone] ________________________________  Name: Dean Carlson MRN: [id number]  Date: 05/19/2013  DOB: 01/09/1961  Weekly Radiation Therapy Management  Current Dose: 69 Gy     Planned Dose:  75 Gy  Narrative . . . . . . . . The patient presents for routine under treatment assessment.                                  Weekly rad tx prostate 37/40, slight dysuria again, loose stools, Nocturia 10-11 x last night, not drinking enough water, Dark urine, ate steak last night first time in long time, feels that messed his digestion up and caused the loose stools, no hematuria, tired, stayed up late last night also on phone  The patient is without complaint.                                 Set-up films were reviewed.                                 The chart was checked. Physical Findings. . .  weight is 213 lb 3.2 oz (96.707 kg). His oral temperature is 98.6 F (37 C). His blood pressure is 102/60 and his pulse is 83. His respiration is 20. . Weight essentially stable.  No significant changes. Impression . . . . . . . The patient is tolerating radiation. Plan . . . . . . . . . . . . Continue treatment as planned.  ________________________________  Sheral Apley. Tammi Klippel, M.D.

## 2013-05-20 ENCOUNTER — Ambulatory Visit
Admission: RE | Admit: 2013-05-20 | Discharge: 2013-05-20 | Disposition: A | Discharge status: Home / Self Care | Payer: Medicare Other | Source: Ambulatory Visit | Attending: Radiation Oncology | Admitting: Radiation Oncology

## 2013-05-20 ENCOUNTER — Ambulatory Visit: Payer: Medicare Other | Admitting: Radiation Oncology

## 2013-05-20 DIAGNOSIS — C61 Malignant neoplasm of prostate: Secondary | ICD-10-CM | POA: Diagnosis not present

## 2013-05-20 DIAGNOSIS — Z510 Encounter for antineoplastic radiation therapy: Secondary | ICD-10-CM | POA: Diagnosis not present

## 2013-05-23 ENCOUNTER — Ambulatory Visit
Admission: RE | Admit: 2013-05-23 | Discharge: 2013-05-23 | Disposition: A | Discharge status: Home / Self Care | Payer: Medicare Other | Source: Ambulatory Visit | Attending: Radiation Oncology | Admitting: Radiation Oncology

## 2013-05-23 DIAGNOSIS — C61 Malignant neoplasm of prostate: Secondary | ICD-10-CM | POA: Diagnosis not present

## 2013-05-23 DIAGNOSIS — Z510 Encounter for antineoplastic radiation therapy: Secondary | ICD-10-CM | POA: Diagnosis not present

## 2013-05-24 ENCOUNTER — Encounter: Payer: Self-pay | Admitting: *Deleted

## 2013-05-24 ENCOUNTER — Ambulatory Visit
Admission: RE | Admit: 2013-05-24 | Discharge: 2013-05-24 | Disposition: A | Discharge status: Home / Self Care | Payer: Medicare Other | Source: Ambulatory Visit | Attending: Radiation Oncology | Admitting: Radiation Oncology

## 2013-05-24 ENCOUNTER — Encounter: Payer: Self-pay | Admitting: Radiation Oncology

## 2013-05-24 ENCOUNTER — Ambulatory Visit: Payer: Medicare Other

## 2013-05-24 DIAGNOSIS — C61 Malignant neoplasm of prostate: Secondary | ICD-10-CM | POA: Diagnosis not present

## 2013-05-24 DIAGNOSIS — Z510 Encounter for antineoplastic radiation therapy: Secondary | ICD-10-CM | POA: Diagnosis not present

## 2013-05-25 ENCOUNTER — Ambulatory Visit: Payer: Medicare Other

## 2013-05-28 NOTE — Progress Notes (Signed)
Met with pt during final RT to offer support and to celebrate end of radiation treatment.    Gayleen Orem, RN, BSN, [facility] Prostate Oncology Navigator [phone]

## 2013-05-29 NOTE — Progress Notes (Signed)
  Radiation Oncology         [phone]) [phone] ________________________________  Name: Dean Carlson MRN: [id number]  Date: 05/24/2013  DOB: [birth year]  End of Treatment Note  Diagnosis:   53 y.o. gentleman with stage T2c N1 adenocarcinoma of the prostate with a Gleason's score of 4+5 and a PSA of 276  Indication for treatment:  Curative       Radiation treatment dates:   03/07/2013-05/24/2013  Site/dose:    1.  The prostate, seminal vesicles, and pelvic lymph nodes were initially treated to 45 gray in 25 fractions of 1.8 gray 2.  the prostate only was boosted to 75 gray with 15 additional fractions of 2.0 gray  Beams/energy:    1.  The prostate, seminal vesicles, and pelvic lymph nodes were initially treated using helical intensity modulated radiotherapy delivering 6 megavolt photons. Image guidance was performed with megavoltage CT studies prior to each fraction. He was immobilized with a body fix lower extremity mold. 2.  the prostate only was boosted using helical intensity modulated radiotherapy delivering 6 megavolt photons. Image guidance was performed with megavoltage CT studies prior to each fraction. He was immobilized with a body fix lower extremity mold.  Narrative: The patient tolerated radiation treatment relatively well.   He did not have any major side effects through most of his weeks of radiation treatment. During his final visit, he did describe a difficult night awakening 10 times with nocturia and passing loose stools. This was a transient event that he attributed to his food choices the night before  Plan: The patient has completed radiation treatment. The patient will return to radiation oncology clinic for routine followup in one month. I advised them to call or return sooner if they have any questions or concerns related to their recovery or treatment. ________________________________  Sheral Apley. Tammi Klippel, M.D.

## 2013-05-31 DIAGNOSIS — C61 Malignant neoplasm of prostate: Secondary | ICD-10-CM | POA: Diagnosis not present

## 2013-06-14 ENCOUNTER — Other Ambulatory Visit: Payer: Medicare Other

## 2013-06-14 DIAGNOSIS — B20 Human immunodeficiency virus [HIV] disease: Secondary | ICD-10-CM

## 2013-06-14 LAB — CBC WITH DIFFERENTIAL/PLATELET
Basophils Absolute: 0 10*3/uL (ref 0.0–0.1)
Basophils Relative: 0 % (ref 0–1)
EOS PCT: 2 % (ref 0–5)
Eosinophils Absolute: 0.1 10*3/uL (ref 0.0–0.7)
HEMATOCRIT: 34.9 % — AB (ref 39.0–52.0)
Hemoglobin: 12.1 g/dL — ABNORMAL LOW (ref 13.0–17.0)
LYMPHS ABS: 0.8 10*3/uL (ref 0.7–4.0)
LYMPHS PCT: 18 % (ref 12–46)
MCH: 30.9 pg (ref 26.0–34.0)
MCHC: 34.7 g/dL (ref 30.0–36.0)
MCV: 89.3 fL (ref 78.0–100.0)
Monocytes Absolute: 0.6 10*3/uL (ref 0.1–1.0)
Monocytes Relative: 15 % — ABNORMAL HIGH (ref 3–12)
Neutro Abs: 2.7 10*3/uL (ref 1.7–7.7)
Neutrophils Relative %: 65 % (ref 43–77)
PLATELETS: 256 10*3/uL (ref 150–400)
RBC: 3.91 MIL/uL — AB (ref 4.22–5.81)
RDW: 15 % (ref 11.5–15.5)
WBC: 4.2 10*3/uL (ref 4.0–10.5)

## 2013-06-14 LAB — COMPLETE METABOLIC PANEL WITH GFR
ALBUMIN: 3.9 g/dL (ref 3.5–5.2)
ALT: 22 U/L (ref 0–53)
AST: 15 U/L (ref 0–37)
Alkaline Phosphatase: 108 U/L (ref 39–117)
BUN: 18 mg/dL (ref 6–23)
CALCIUM: 9 mg/dL (ref 8.4–10.5)
CHLORIDE: 103 meq/L (ref 96–112)
CO2: 29 meq/L (ref 19–32)
CREATININE: 0.77 mg/dL (ref 0.50–1.35)
GFR, Est Non African American: >89 mL/min
Glucose, Bld: 89 mg/dL (ref 70–99)
POTASSIUM: 3.9 meq/L (ref 3.5–5.3)
Sodium: 140 mEq/L (ref 135–145)
TOTAL PROTEIN: 6.9 g/dL (ref 6.0–8.3)
Total Bilirubin: 0.2 mg/dL (ref 0.2–1.2)

## 2013-06-15 LAB — HIV-1 RNA QUANT-NO REFLEX-BLD
HIV 1 RNA Quant: <20 copies/mL (ref <20)
HIV-1 RNA Quant, Log: <1.3 {Log} (ref <1.30)

## 2013-06-15 LAB — T-HELPER CELL (CD4) - (RCID CLINIC ONLY)
CD4 T CELL HELPER: 16 % — AB (ref 33–55)
CD4 T Cell Abs: 130 /uL — ABNORMAL LOW (ref 400–2700)

## 2013-06-29 ENCOUNTER — Telehealth: Payer: Self-pay | Admitting: *Deleted

## 2013-06-29 ENCOUNTER — Encounter: Payer: Self-pay | Admitting: Infectious Disease

## 2013-06-29 ENCOUNTER — Ambulatory Visit (INDEPENDENT_AMBULATORY_CARE_PROVIDER_SITE_OTHER): Payer: Medicare Other | Admitting: Infectious Disease

## 2013-06-29 VITALS — BP 135/83 | HR 83 | Temp 98.1°F | Wt 214.0 lb

## 2013-06-29 DIAGNOSIS — R635 Abnormal weight gain: Secondary | ICD-10-CM | POA: Diagnosis not present

## 2013-06-29 DIAGNOSIS — Z113 Encounter for screening for infections with a predominantly sexual mode of transmission: Secondary | ICD-10-CM

## 2013-06-29 DIAGNOSIS — C61 Malignant neoplasm of prostate: Secondary | ICD-10-CM | POA: Diagnosis not present

## 2013-06-29 DIAGNOSIS — R5381 Other malaise: Secondary | ICD-10-CM | POA: Diagnosis not present

## 2013-06-29 DIAGNOSIS — Z79899 Other long term (current) drug therapy: Secondary | ICD-10-CM | POA: Diagnosis not present

## 2013-06-29 DIAGNOSIS — R5383 Other fatigue: Secondary | ICD-10-CM | POA: Diagnosis not present

## 2013-06-29 DIAGNOSIS — B20 Human immunodeficiency virus [HIV] disease: Secondary | ICD-10-CM | POA: Diagnosis not present

## 2013-06-29 DIAGNOSIS — R531 Weakness: Secondary | ICD-10-CM

## 2013-06-29 LAB — LIPID PANEL
CHOL/HDL RATIO: 4.5 ratio
Cholesterol: 210 mg/dL — ABNORMAL HIGH (ref 0–200)
HDL: 47 mg/dL (ref >39)
LDL CALC: 147 mg/dL — AB (ref 0–99)
Triglycerides: 79 mg/dL (ref <150)
VLDL: 16 mg/dL (ref 0–40)

## 2013-06-29 NOTE — Telephone Encounter (Signed)
Called patient to alter fu visit for 06-30-13 due to Dr. Tammi Klippel being in the OR, altered fu visit till 12 p.m., patient agreed to new time

## 2013-06-29 NOTE — Progress Notes (Signed)
  Subjective:    Patient ID: Dean Carlson, male    DOB: 06-02-1960, 53 y.o.   MRN: [id number]  HPI   Dean Carlson is a 53 y.o. male who is doing superbly well on his antiviral regimen, ATRIPLA with undetectable viral load   He has been diagnosed with T2c N1 adenocarcinoma of the prostate with a Gleason's score of 4+5 which put him in high risk group. He is currently undergoing chemical castration along with XRT by Dr. Tammi Klippel  His CD4 is now lower than 200 post XRT which has now stopped.   He is feeling more easily fatigued and is gaining weight.   Review of Systems  Constitutional: Negative for fever, chills, diaphoresis, activity change, appetite change, fatigue and unexpected weight change.  HENT: Negative for congestion, rhinorrhea, sinus pressure, sneezing, sore throat and trouble swallowing.   Eyes: Negative for photophobia and visual disturbance.  Respiratory: Negative for cough, chest tightness, shortness of breath, wheezing and stridor.   Cardiovascular: Negative for chest pain, palpitations and leg swelling.  Gastrointestinal: Negative for nausea, vomiting, abdominal pain, diarrhea, constipation, blood in stool, abdominal distention and anal bleeding.  Genitourinary: Negative for dysuria, hematuria, flank pain and difficulty urinating.  Musculoskeletal: Negative for arthralgias, back pain, gait problem, joint swelling and myalgias.  Skin: Negative for color change, pallor, rash and wound.  Neurological: Negative for dizziness, tremors, weakness and light-headedness.  Hematological: Negative for adenopathy. Does not bruise/bleed easily.  Psychiatric/Behavioral: Negative for behavioral problems, confusion, sleep disturbance and decreased concentration.       Objective:   Physical Exam  Constitutional: He is oriented to person, place, and time. He appears well-developed and well-nourished. No distress.  HENT:  Head: Normocephalic and atraumatic.  Mouth/Throat: Oropharynx is  clear and moist. No oropharyngeal exudate.  Eyes: Conjunctivae and EOM are normal. Pupils are equal, round, and reactive to light. No scleral icterus.  Neck: Normal range of motion. Neck supple. No JVD present.  Cardiovascular: Normal rate, regular rhythm and normal heart sounds.  Exam reveals no gallop and no friction rub.   No murmur heard. Pulmonary/Chest: Effort normal and breath sounds normal. No respiratory distress. He has no wheezes. He has no rales. He exhibits no tenderness.  Abdominal: He exhibits no distension and no mass. There is no tenderness. There is no rebound and no guarding.  Musculoskeletal: He exhibits no edema and no tenderness.  Lymphadenopathy:    He has no cervical adenopathy.  Neurological: He is alert and oriented to person, place, and time. He exhibits normal muscle tone. Coordination normal.  Skin: Skin is warm and dry. He is not diaphoretic. No erythema. No pallor.  Psychiatric: He has a normal mood and affect. His behavior is normal. Thought content normal.          Assessment & Plan:  HIV: Perfect control continue Atripla. I spent greater than 40 minutes with the patient including greater than 50% of time in face to face counsel of the patient and in coordination of their care.   Low WBC: with lower CD4: check CD4 today and if it is still <200 restart bactrim   Prostate cancer: on anti-androgen therapy and sp XRT being followed closely by Oncology and Rad/Oncology  Weakness, fatigue: possibly due to anti-androgen therapy, encouraged to exercise as able

## 2013-06-30 ENCOUNTER — Ambulatory Visit
Admission: RE | Admit: 2013-06-30 | Discharge: 2013-06-30 | Disposition: A | Discharge status: Home / Self Care | Payer: Medicare Other | Source: Ambulatory Visit | Attending: Radiation Oncology | Admitting: Radiation Oncology

## 2013-06-30 ENCOUNTER — Encounter: Payer: Self-pay | Admitting: Radiation Oncology

## 2013-06-30 VITALS — BP 123/86 | HR 100 | Resp 16 | Wt 213.0 lb

## 2013-06-30 DIAGNOSIS — C61 Malignant neoplasm of prostate: Secondary | ICD-10-CM

## 2013-06-30 LAB — T-HELPER CELL (CD4) - (RCID CLINIC ONLY)
CD4 T CELL ABS: 140 /uL — AB (ref 400–2700)
CD4 T CELL HELPER: 20 % — AB (ref 33–55)

## 2013-06-30 NOTE — Progress Notes (Signed)
Reports mild fatigue. Reports nocturia x8. Denies diarrhea. Report constipation if he eats red meat and bread. Reports urgency. Reports he occasionally has to strain to void. Reports internal hemorrhoid continues to be problematic. Reports an intermittent urine stream. Reports mild dysuria. Denies hematuria. Reports hot flashes continue. Reports he stopped taking his flomax. Reports he occasionally wakes up in the middle of the night with a left parietal headache. Denies nausea, vomiting, or dizziness.

## 2013-06-30 NOTE — Progress Notes (Signed)
  Radiation Oncology         ([phone]) [phone] ________________________________  Name: Dean Carlson MRN: [id number]  Date: 06/30/2013  DOB: [birth year]  Follow-Up Visit Note  CC: Alcide Evener, MD  Dutch Gray, MD  Diagnosis:   53 y.o. gentleman with stage T2c N1 adenocarcinoma of the prostate with a Gleason's score of 4+5 and a PSA of 276 s/p Curative radiotherapy with androgen deprivation 03/07/2013-05/24/2013 with pelvic lymph nodes treated to 45 gray and the prostate boosted to 75 gray  Interval Since Last Radiation:  4  weeks  Narrative:  The patient returns today for routine follow-up.  Reports mild fatigue. Reports nocturia x8. Denies diarrhea. Report constipation if he eats red meat and bread. Reports urgency. Reports he occasionally has to strain to void. Reports internal hemorrhoid continues to be problematic. Reports an intermittent urine stream. Reports mild dysuria. Denies hematuria. Reports hot flashes continue. Reports he stopped taking his flomax. Reports he occasionally wakes up in the middle of the night with a left parietal headache. Denies nausea, vomiting, or dizziness                             ALLERGIES:  is allergic to alka-seltzer and aspirin-acetaminophen-caffeine.  Meds: Current Outpatient Prescriptions  Medication Sig Dispense Refill  . efavirenz-emtricitabine-tenofovir (ATRIPLA) [phone] MG per tablet Take 1 tablet by mouth at bedtime.  90 tablet  3  . diazepam (VALIUM) 10 MG tablet       . tamsulosin (FLOMAX) 0.4 MG CAPS capsule Take 1 capsule (0.4 mg total) by mouth at bedtime.  30 capsule  11   No current facility-administered medications for this encounter.    Physical Findings: The patient is in no acute distress. Patient is alert and oriented.  weight is 213 lb (96.616 kg). His blood pressure is 123/86 and his pulse is 100. His respiration is 16. .  No significant changes.  Lab Findings: Lab Results  Component Value Date   WBC 4.2 06/14/2013   HGB  12.1* 06/14/2013   HCT 34.9* 06/14/2013   MCV 89.3 06/14/2013   PLT 256 06/14/2013   CD4 count is recovering from 130 to 140 during April.  Impression:  The patient is recovering from the effects of radiation.   Plan:  He will continue to follow-up with urology for ongoing PSA determinations.  I will look forward to following his response through their correspondence, and be happy to participate in care if clinically indicated.  I talked to the patient about what to expect in the future, including his risk for erectile dysfunction and rectal bleeding.  I encouraged him to call or return to the office if he has any question about his previous radiation or possible radiation effects.  He was comfortable with this plan.  _____________________________________  Sheral Apley. Tammi Klippel, M.D.

## 2013-10-06 DIAGNOSIS — C61 Malignant neoplasm of prostate: Secondary | ICD-10-CM | POA: Diagnosis not present

## 2013-10-13 DIAGNOSIS — C61 Malignant neoplasm of prostate: Secondary | ICD-10-CM | POA: Diagnosis not present

## 2013-11-17 ENCOUNTER — Telehealth: Payer: Self-pay | Admitting: *Deleted

## 2013-11-17 NOTE — Telephone Encounter (Signed)
Patient called asking about his new medication - megace 20mg  1 tablet daily was prescribed by his urologist Dr. Janice Norrie for hot flashes due to his lupron injections (tx for aggressive prostate cancer).  He is scheduled to complete Lupron injections June 2017.  He is concerned with the side effects of megace (mostly concerned with gynocomastia).   He will contact his urologist with questions, but wanted to make sure Dr. [name] was aware of the medication. Landis Gandy, RN

## 2013-11-17 NOTE — Telephone Encounter (Signed)
I would follow the advice of the urologist

## 2013-11-28 ENCOUNTER — Telehealth: Payer: Self-pay | Admitting: *Deleted

## 2013-11-28 NOTE — Telephone Encounter (Signed)
Very good 

## 2013-11-28 NOTE — Telephone Encounter (Signed)
Pt wanted Dr. [name] to know that he is being followed by Dr. Judy Pimple, Urology at [facility].  Still having problems with "hot flashes" due to hormone therapy related to CA treatment.

## 2013-12-21 ENCOUNTER — Other Ambulatory Visit: Payer: Medicare Other

## 2013-12-21 DIAGNOSIS — Z113 Encounter for screening for infections with a predominantly sexual mode of transmission: Secondary | ICD-10-CM

## 2013-12-21 DIAGNOSIS — B20 Human immunodeficiency virus [HIV] disease: Secondary | ICD-10-CM

## 2013-12-21 DIAGNOSIS — C61 Malignant neoplasm of prostate: Secondary | ICD-10-CM

## 2013-12-21 DIAGNOSIS — Z79899 Other long term (current) drug therapy: Secondary | ICD-10-CM | POA: Diagnosis not present

## 2013-12-21 DIAGNOSIS — R635 Abnormal weight gain: Secondary | ICD-10-CM | POA: Diagnosis not present

## 2013-12-21 DIAGNOSIS — R531 Weakness: Secondary | ICD-10-CM | POA: Diagnosis not present

## 2013-12-21 LAB — COMPLETE METABOLIC PANEL WITH GFR
ALT: 19 U/L (ref 0–53)
AST: 18 U/L (ref 0–37)
Albumin: 4.3 g/dL (ref 3.5–5.2)
Alkaline Phosphatase: 150 U/L — ABNORMAL HIGH (ref 39–117)
BILIRUBIN TOTAL: 0.4 mg/dL (ref 0.2–1.2)
BUN: 13 mg/dL (ref 6–23)
CALCIUM: 8.9 mg/dL (ref 8.4–10.5)
CO2: 27 meq/L (ref 19–32)
CREATININE: 0.83 mg/dL (ref 0.50–1.35)
Chloride: 104 mEq/L (ref 96–112)
GFR, Est Non African American: >89 mL/min
Glucose, Bld: 117 mg/dL — ABNORMAL HIGH (ref 70–99)
Potassium: 3.8 mEq/L (ref 3.5–5.3)
Sodium: 141 mEq/L (ref 135–145)
Total Protein: 6.8 g/dL (ref 6.0–8.3)

## 2013-12-21 LAB — CBC WITH DIFFERENTIAL/PLATELET
BASOS ABS: 0 10*3/uL (ref 0.0–0.1)
Basophils Relative: 0 % (ref 0–1)
EOS ABS: 0.1 10*3/uL (ref 0.0–0.7)
EOS PCT: 2 % (ref 0–5)
HEMATOCRIT: 36.4 % — AB (ref 39.0–52.0)
Hemoglobin: 12.4 g/dL — ABNORMAL LOW (ref 13.0–17.0)
Lymphocytes Relative: 24 % (ref 12–46)
Lymphs Abs: 1.1 10*3/uL (ref 0.7–4.0)
MCH: 30.4 pg (ref 26.0–34.0)
MCHC: 34.1 g/dL (ref 30.0–36.0)
MCV: 89.2 fL (ref 78.0–100.0)
MONO ABS: 0.7 10*3/uL (ref 0.1–1.0)
Monocytes Relative: 15 % — ABNORMAL HIGH (ref 3–12)
Neutro Abs: 2.6 10*3/uL (ref 1.7–7.7)
Neutrophils Relative %: 59 % (ref 43–77)
PLATELETS: 223 10*3/uL (ref 150–400)
RBC: 4.08 MIL/uL — ABNORMAL LOW (ref 4.22–5.81)
RDW: 15.2 % (ref 11.5–15.5)
WBC: 4.4 10*3/uL (ref 4.0–10.5)

## 2013-12-21 LAB — HEPATITIS C ANTIBODY: HCV AB: NEGATIVE

## 2013-12-22 LAB — T-HELPER CELL (CD4) - (RCID CLINIC ONLY)
CD4 % Helper T Cell: 19 % — ABNORMAL LOW (ref 33–55)
CD4 T CELL ABS: 240 /uL — AB (ref 400–2700)

## 2013-12-22 LAB — HIV-1 RNA QUANT-NO REFLEX-BLD
HIV 1 RNA Quant: <20 copies/mL (ref <20)
HIV-1 RNA Quant, Log: <1.3 {Log} (ref <1.30)

## 2013-12-22 LAB — RPR

## 2014-01-04 ENCOUNTER — Encounter: Payer: Self-pay | Admitting: Infectious Disease

## 2014-01-04 ENCOUNTER — Ambulatory Visit (INDEPENDENT_AMBULATORY_CARE_PROVIDER_SITE_OTHER): Payer: Medicare Other | Admitting: Infectious Disease

## 2014-01-04 ENCOUNTER — Ambulatory Visit (INDEPENDENT_AMBULATORY_CARE_PROVIDER_SITE_OTHER): Payer: Medicare Other | Admitting: Licensed Clinical Social Worker

## 2014-01-04 VITALS — BP 122/77 | HR 81 | Temp 98.2°F | Wt 224.0 lb

## 2014-01-04 DIAGNOSIS — C61 Malignant neoplasm of prostate: Secondary | ICD-10-CM | POA: Diagnosis not present

## 2014-01-04 DIAGNOSIS — Z23 Encounter for immunization: Secondary | ICD-10-CM | POA: Diagnosis not present

## 2014-01-04 DIAGNOSIS — E785 Hyperlipidemia, unspecified: Secondary | ICD-10-CM

## 2014-01-04 DIAGNOSIS — B20 Human immunodeficiency virus [HIV] disease: Secondary | ICD-10-CM

## 2014-01-04 NOTE — Progress Notes (Signed)
  Subjective:    Patient ID: Dean Carlson, male    DOB: [birth year], 53 y.o.   MRN: [id number]  HPI   Dean Carlson is a 53 y.o. male who is doing superbly well on his antiviral regimen, ATRIPLA with undetectable viral load   He has been diagnosed with T2c N1 adenocarcinoma of the prostate with a Gleason's score of 4+5 which put him in high risk group.    He is  Currently still receiving lupron forchemical castration and has finished with XRT by Dr. Tammi Klippel  His CD4 has risen since coming off XRT and VL is undetectable  Lab Results  Component Value Date   HIV1RNAQUANT <20 12/21/2013   Lab Results  Component Value Date   CD4TABS 240* 12/21/2013   CD4TABS 140* 06/29/2013   CD4TABS 130* 06/14/2013      Review of Systems  Constitutional: Negative for fever, chills, diaphoresis, activity change, appetite change, fatigue and unexpected weight change.  HENT: Negative for congestion, rhinorrhea, sinus pressure, sneezing, sore throat and trouble swallowing.   Eyes: Negative for photophobia and visual disturbance.  Respiratory: Negative for cough, chest tightness, shortness of breath, wheezing and stridor.   Cardiovascular: Negative for chest pain, palpitations and leg swelling.  Gastrointestinal: Negative for nausea, vomiting, abdominal pain, diarrhea, constipation, blood in stool, abdominal distention and anal bleeding.  Genitourinary: Negative for dysuria, hematuria, flank pain and difficulty urinating.  Musculoskeletal: Negative for myalgias, back pain, joint swelling, arthralgias and gait problem.  Skin: Negative for color change, pallor, rash and wound.  Neurological: Negative for dizziness, tremors, weakness and light-headedness.  Hematological: Negative for adenopathy. Does not bruise/bleed easily.  Psychiatric/Behavioral: Negative for behavioral problems, confusion, sleep disturbance and decreased concentration.       Objective:   Physical Exam  Constitutional: He appears  well-developed and well-nourished.  Eyes: EOM are normal.  Neck: Normal range of motion. No thyromegaly present.  Cardiovascular: Normal rate and regular rhythm.   Pulmonary/Chest: Effort normal and breath sounds normal.  Abdominal: Soft. Bowel sounds are normal.  Musculoskeletal: Normal range of motion. He exhibits no edema or tenderness.  Neurological: He is alert. No cranial nerve deficit.  Skin: No rash noted.  Psychiatric: He has a normal mood and affect. His behavior is normal. Judgment and thought content normal.  Nursing note and vitals reviewed.         Assessment & Plan:  HIV: Perfect control continue Atripla. He does not want to change regimens though we may also want to change him for better bone health of newer version of Truvada TAF   I spent greater than 25  minutes with the patient including greater than 50% of time in face to face counsel of the patient and in coordination of their care.   Prostate cancer: on anti-androgen therapy and sp XRT being followed closely by Oncology and Rad/Oncology  Depression anxiety: feels he is coping with this well.

## 2014-02-17 DIAGNOSIS — C61 Malignant neoplasm of prostate: Secondary | ICD-10-CM | POA: Diagnosis not present

## 2014-03-15 ENCOUNTER — Telehealth: Payer: Self-pay | Admitting: *Deleted

## 2014-03-15 DIAGNOSIS — B20 Human immunodeficiency virus [HIV] disease: Secondary | ICD-10-CM

## 2014-03-15 NOTE — Telephone Encounter (Signed)
I think Genvoya should be fine for Medicaid.

## 2014-03-15 NOTE — Telephone Encounter (Signed)
Pt willing to switch to new once-a-day rx when available.  Pt is currently covered by Medicaid.  Pt has enough Atripla to last him for 30 days.

## 2014-03-15 NOTE — Telephone Encounter (Signed)
Minh what do you think about Genvoya for him. I believe he may also be a TRIUMEQ candidate as well He has medicaid

## 2014-03-16 MED ORDER — ELVITEG-COBIC-EMTRICIT-TENOFAF 150-150-200-10 MG PO TABS
1.0000 | ORAL_TABLET | Freq: Every day | ORAL | Status: DC
Start: 2014-03-16 — End: 2014-03-30

## 2014-03-16 NOTE — Telephone Encounter (Addendum)
Per Jene Every, pharmacist, pt is also Medicare.  Will place order for Genvoya and allow [facility] to try to obtain rx for the pt.  [facility] trying to run Genvoya through pt's Part D they have on file.

## 2014-03-16 NOTE — Addendum Note (Signed)
Addended by: Lorne Skeens D on: 03/16/2014 02:14 PM   Modules accepted: Orders

## 2014-03-16 NOTE — Telephone Encounter (Signed)
Unfortunately Medicaid has not approved Genvoya yet.  Pt will need to wait until available.

## 2014-03-16 NOTE — Telephone Encounter (Signed)
Very good 

## 2014-03-17 ENCOUNTER — Other Ambulatory Visit: Payer: Self-pay | Admitting: Infectious Disease

## 2014-03-20 ENCOUNTER — Telehealth: Payer: Self-pay | Admitting: *Deleted

## 2014-03-20 NOTE — Telephone Encounter (Signed)
Genvoya approved by pt's Part D Medicare, pt informed

## 2014-03-20 NOTE — Telephone Encounter (Signed)
Script was sent to the pharmacy already and pt knows to finish his [place] then start the [place].

## 2014-03-20 NOTE — Telephone Encounter (Signed)
Excellent do I need to write script?

## 2014-03-29 ENCOUNTER — Telehealth: Payer: Self-pay | Admitting: *Deleted

## 2014-03-29 NOTE — Telephone Encounter (Signed)
Patient called and advised he was contacted by his insurance and advised they will not approve his Genvoya and the patient wants to stay on Atripla. Advised the patient I will let his doctor know and someone will give him a call back.

## 2014-03-29 NOTE — Telephone Encounter (Signed)
That is fine 

## 2014-03-30 ENCOUNTER — Other Ambulatory Visit: Payer: Self-pay | Admitting: Licensed Clinical Social Worker

## 2014-06-15 DIAGNOSIS — C61 Malignant neoplasm of prostate: Secondary | ICD-10-CM | POA: Diagnosis not present

## 2014-06-22 ENCOUNTER — Other Ambulatory Visit: Payer: Self-pay | Admitting: Infectious Disease

## 2014-06-22 ENCOUNTER — Other Ambulatory Visit (HOSPITAL_COMMUNITY): Payer: Self-pay | Admitting: Urology

## 2014-06-22 DIAGNOSIS — C61 Malignant neoplasm of prostate: Secondary | ICD-10-CM | POA: Diagnosis not present

## 2014-06-30 ENCOUNTER — Ambulatory Visit (HOSPITAL_COMMUNITY): Payer: Medicare Other

## 2014-06-30 ENCOUNTER — Encounter (HOSPITAL_COMMUNITY): Payer: Medicare Other

## 2014-07-06 ENCOUNTER — Other Ambulatory Visit: Payer: Medicare Other

## 2014-07-06 DIAGNOSIS — C61 Malignant neoplasm of prostate: Secondary | ICD-10-CM

## 2014-07-06 DIAGNOSIS — E785 Hyperlipidemia, unspecified: Secondary | ICD-10-CM | POA: Diagnosis not present

## 2014-07-06 DIAGNOSIS — B20 Human immunodeficiency virus [HIV] disease: Secondary | ICD-10-CM | POA: Diagnosis not present

## 2014-07-06 LAB — COMPLETE METABOLIC PANEL WITH GFR
ALT: 19 U/L (ref 0–53)
AST: 15 U/L (ref 0–37)
Albumin: 3.9 g/dL (ref 3.5–5.2)
Alkaline Phosphatase: 143 U/L — ABNORMAL HIGH (ref 39–117)
BUN: 16 mg/dL (ref 6–23)
CO2: 24 mEq/L (ref 19–32)
Calcium: 9 mg/dL (ref 8.4–10.5)
Chloride: 105 mEq/L (ref 96–112)
Creat: 0.76 mg/dL (ref 0.50–1.35)
GLUCOSE: 111 mg/dL — AB (ref 70–99)
POTASSIUM: 4.4 meq/L (ref 3.5–5.3)
SODIUM: 140 meq/L (ref 135–145)
Total Bilirubin: 0.2 mg/dL (ref 0.2–1.2)
Total Protein: 7 g/dL (ref 6.0–8.3)

## 2014-07-06 LAB — CBC WITH DIFFERENTIAL/PLATELET
Basophils Absolute: 0 10*3/uL (ref 0.0–0.1)
Basophils Relative: 0 % (ref 0–1)
Eosinophils Absolute: 0.1 10*3/uL (ref 0.0–0.7)
Eosinophils Relative: 2 % (ref 0–5)
HCT: 36.4 % — ABNORMAL LOW (ref 39.0–52.0)
HEMOGLOBIN: 12.3 g/dL — AB (ref 13.0–17.0)
Lymphocytes Relative: 29 % (ref 12–46)
Lymphs Abs: 1.5 10*3/uL (ref 0.7–4.0)
MCH: 30.2 pg (ref 26.0–34.0)
MCHC: 33.8 g/dL (ref 30.0–36.0)
MCV: 89.4 fL (ref 78.0–100.0)
MPV: 10.6 fL (ref 8.6–12.4)
Monocytes Absolute: 0.6 10*3/uL (ref 0.1–1.0)
Monocytes Relative: 12 % (ref 3–12)
NEUTROS PCT: 57 % (ref 43–77)
Neutro Abs: 3 10*3/uL (ref 1.7–7.7)
Platelets: 219 10*3/uL (ref 150–400)
RBC: 4.07 MIL/uL — AB (ref 4.22–5.81)
RDW: 15 % (ref 11.5–15.5)
WBC: 5.2 10*3/uL (ref 4.0–10.5)

## 2014-07-06 LAB — LIPID PANEL
Cholesterol: 163 mg/dL (ref 0–200)
HDL: 42 mg/dL (ref >=40)
LDL Cholesterol: 81 mg/dL (ref 0–99)
Total CHOL/HDL Ratio: 3.9 Ratio
Triglycerides: 200 mg/dL — ABNORMAL HIGH (ref <150)
VLDL: 40 mg/dL (ref 0–40)

## 2014-07-07 ENCOUNTER — Encounter (HOSPITAL_COMMUNITY)
Admission: RE | Admit: 2014-07-07 | Discharge: 2014-07-07 | Disposition: A | Discharge status: Home / Self Care | Payer: Medicare Other | Source: Ambulatory Visit | Attending: Urology | Admitting: Urology

## 2014-07-07 DIAGNOSIS — C61 Malignant neoplasm of prostate: Secondary | ICD-10-CM | POA: Diagnosis not present

## 2014-07-07 LAB — T-HELPER CELL (CD4) - (RCID CLINIC ONLY)
CD4 T CELL HELPER: 22 % — AB (ref 33–55)
CD4 T Cell Abs: 330 /uL — ABNORMAL LOW (ref 400–2700)

## 2014-07-07 LAB — HIV-1 RNA QUANT-NO REFLEX-BLD

## 2014-07-07 LAB — RPR

## 2014-07-07 MED ORDER — TECHNETIUM TC 99M MEDRONATE IV KIT
26.8000 | PACK | Freq: Once | INTRAVENOUS | Status: AC | PRN
  Administered 2014-07-07: 26.8 via INTRAVENOUS

## 2014-07-17 ENCOUNTER — Encounter: Payer: Self-pay | Admitting: Infectious Disease

## 2014-07-17 ENCOUNTER — Ambulatory Visit (INDEPENDENT_AMBULATORY_CARE_PROVIDER_SITE_OTHER): Payer: Medicare Other | Admitting: Infectious Disease

## 2014-07-17 VITALS — BP 127/82 | HR 90 | Temp 98.3°F | Ht 71.0 in | Wt 226.0 lb

## 2014-07-17 DIAGNOSIS — C61 Malignant neoplasm of prostate: Secondary | ICD-10-CM

## 2014-07-17 DIAGNOSIS — E785 Hyperlipidemia, unspecified: Secondary | ICD-10-CM | POA: Diagnosis not present

## 2014-07-17 DIAGNOSIS — B20 Human immunodeficiency virus [HIV] disease: Secondary | ICD-10-CM | POA: Diagnosis present

## 2014-07-17 DIAGNOSIS — F0630 Mood disorder due to known physiological condition, unspecified: Secondary | ICD-10-CM

## 2014-07-17 NOTE — Progress Notes (Signed)
  Subjective:    Patient ID: Dean Carlson, male    DOB: [birth year], 54 y.o.   MRN: [id number]  HPI   Dean Carlson is a 54 y.o. male who is doing superbly well on his antiviral regimen, ATRIPLA with undetectable viral load   He has been diagnosed with T2c N1 adenocarcinoma of the prostate with a Gleason's score of 4+5 which put him in high risk group.    He he had been receiving lupron forchemical castration and has finished with XRT by Dr. Tammi Klippel  His CD4 has risen since coming off XRT and VL is undetectable.  He is supposed to be taking casodex but he is reluctant to start this medicine as the lupron made him tired and gain weight-he feels.   I encouraged him strongly to start the casodex as I would like his prostate cancer to be under control.  Lab Results  Component Value Date   HIV1RNAQUANT <20 07/06/2014   Lab Results  Component Value Date   CD4TABS 330* 07/06/2014   CD4TABS 240* 12/21/2013   CD4TABS 140* 06/29/2013      Review of Systems  Constitutional: Negative for fever, chills, diaphoresis, activity change, appetite change, fatigue and unexpected weight change.  HENT: Negative for congestion, rhinorrhea, sinus pressure, sneezing, sore throat and trouble swallowing.   Eyes: Negative for photophobia and visual disturbance.  Respiratory: Negative for cough, chest tightness, shortness of breath, wheezing and stridor.   Cardiovascular: Negative for chest pain, palpitations and leg swelling.  Gastrointestinal: Negative for nausea, vomiting, abdominal pain, diarrhea, constipation, blood in stool, abdominal distention and anal bleeding.  Genitourinary: Negative for dysuria, hematuria, flank pain and difficulty urinating.  Musculoskeletal: Negative for myalgias, back pain, joint swelling, arthralgias and gait problem.  Skin: Negative for color change, pallor, rash and wound.  Neurological: Negative for dizziness, tremors, weakness and light-headedness.  Hematological:  Negative for adenopathy. Does not bruise/bleed easily.  Psychiatric/Behavioral: Negative for behavioral problems, confusion, sleep disturbance and decreased concentration.       Objective:   Physical Exam  Constitutional: He appears well-developed and well-nourished.  Eyes: EOM are normal.  Neck: Normal range of motion. No thyromegaly present.  Cardiovascular: Normal rate and regular rhythm.   Pulmonary/Chest: Effort normal and breath sounds normal.  Abdominal: Soft. Bowel sounds are normal.  Musculoskeletal: Normal range of motion. He exhibits no edema or tenderness.  Neurological: He is alert. No cranial nerve deficit.  Skin: No rash noted.  Psychiatric: He has a normal mood and affect. His behavior is normal. Judgment and thought content normal.  Nursing note and vitals reviewed.         Assessment & Plan:  HIV: Perfect control continue Atripla. He does not want to change regimens though we may also want to change him to [facility], GENVOYA or Tivicay with DESCOVY when these meds are covered by Medicare.   Prostate cancer: on anti-androgen therapy and sp XRT. He needs to take his casodex and be fully compliant with treatment  Depression anxiety: feels he is coping with this well. He is talking a great deal more about spirituality and faith over the past few years. I have some anxiety that this may be a means he has to avoid being fully compliant with prostate cancer treatment

## 2014-07-19 ENCOUNTER — Other Ambulatory Visit (HOSPITAL_COMMUNITY): Payer: Self-pay | Admitting: Urology

## 2014-07-19 DIAGNOSIS — C61 Malignant neoplasm of prostate: Secondary | ICD-10-CM

## 2014-08-04 ENCOUNTER — Other Ambulatory Visit (HOSPITAL_COMMUNITY): Payer: Medicare Other

## 2014-08-09 ENCOUNTER — Other Ambulatory Visit (HOSPITAL_COMMUNITY): Payer: Medicare Other

## 2014-08-11 ENCOUNTER — Ambulatory Visit (HOSPITAL_COMMUNITY)
Admission: RE | Admit: 2014-08-11 | Discharge: 2014-08-11 | Disposition: A | Discharge status: Home / Self Care | Payer: Medicare Other | Source: Ambulatory Visit | Attending: Urology | Admitting: Urology

## 2014-08-11 ENCOUNTER — Other Ambulatory Visit (HOSPITAL_COMMUNITY): Payer: Self-pay | Admitting: Urology

## 2014-08-11 DIAGNOSIS — C61 Malignant neoplasm of prostate: Secondary | ICD-10-CM

## 2014-08-11 DIAGNOSIS — R590 Localized enlarged lymph nodes: Secondary | ICD-10-CM | POA: Diagnosis not present

## 2014-08-11 DIAGNOSIS — Z8546 Personal history of malignant neoplasm of prostate: Secondary | ICD-10-CM | POA: Diagnosis not present

## 2014-08-11 DIAGNOSIS — M1288 Other specific arthropathies, not elsewhere classified, other specified site: Secondary | ICD-10-CM | POA: Diagnosis not present

## 2014-08-11 DIAGNOSIS — M47817 Spondylosis without myelopathy or radiculopathy, lumbosacral region: Secondary | ICD-10-CM | POA: Diagnosis not present

## 2014-08-11 DIAGNOSIS — M545 Low back pain: Secondary | ICD-10-CM | POA: Diagnosis present

## 2014-08-11 MED ORDER — GADOBENATE DIMEGLUMINE 529 MG/ML IV SOLN
20.0000 mL | Freq: Once | INTRAVENOUS | Status: AC | PRN
  Administered 2014-08-11: 20 mL via INTRAVENOUS

## 2014-08-28 ENCOUNTER — Telehealth: Payer: Self-pay | Admitting: Licensed Clinical Social Worker

## 2014-08-28 ENCOUNTER — Other Ambulatory Visit: Payer: Self-pay | Admitting: Infectious Diseases

## 2014-08-28 ENCOUNTER — Other Ambulatory Visit: Payer: Self-pay | Admitting: Licensed Clinical Social Worker

## 2014-08-28 DIAGNOSIS — B029 Zoster without complications: Secondary | ICD-10-CM

## 2014-08-28 MED ORDER — VALACYCLOVIR HCL 1 G PO TABS: 1000.0000 mg | ORAL_TABLET | Freq: Three times a day (TID) | ORAL | Status: DC

## 2014-08-28 NOTE — Telephone Encounter (Signed)
Patient complaining of herpes outbreak, but has not had one since Dr. Quentin Cornwall treated him several years ago. Patient would like to know if he could get a prescription called in for his outbreak. Please advise

## 2014-08-28 NOTE — Telephone Encounter (Signed)
rx sent for valtrex.  

## 2014-09-12 DIAGNOSIS — C61 Malignant neoplasm of prostate: Secondary | ICD-10-CM | POA: Diagnosis not present

## 2014-09-25 ENCOUNTER — Encounter (HOSPITAL_COMMUNITY): Payer: Self-pay | Admitting: Family Medicine

## 2014-09-25 ENCOUNTER — Telehealth: Payer: Self-pay | Admitting: *Deleted

## 2014-09-25 ENCOUNTER — Emergency Department (HOSPITAL_COMMUNITY)
Admission: EM | Admit: 2014-09-25 | Discharge: 2014-09-25 | Disposition: A | Discharge status: Home / Self Care | Payer: Medicare Other | Attending: Emergency Medicine | Admitting: Emergency Medicine

## 2014-09-25 DIAGNOSIS — Z21 Asymptomatic human immunodeficiency virus [HIV] infection status: Secondary | ICD-10-CM | POA: Diagnosis not present

## 2014-09-25 DIAGNOSIS — Z8546 Personal history of malignant neoplasm of prostate: Secondary | ICD-10-CM | POA: Insufficient documentation

## 2014-09-25 DIAGNOSIS — L509 Urticaria, unspecified: Secondary | ICD-10-CM | POA: Insufficient documentation

## 2014-09-25 DIAGNOSIS — Z79899 Other long term (current) drug therapy: Secondary | ICD-10-CM | POA: Insufficient documentation

## 2014-09-25 DIAGNOSIS — J45909 Unspecified asthma, uncomplicated: Secondary | ICD-10-CM | POA: Insufficient documentation

## 2014-09-25 DIAGNOSIS — Z720 Tobacco use: Secondary | ICD-10-CM | POA: Insufficient documentation

## 2014-09-25 DIAGNOSIS — R21 Rash and other nonspecific skin eruption: Secondary | ICD-10-CM | POA: Diagnosis present

## 2014-09-25 MED ORDER — DIPHENHYDRAMINE HCL 25 MG PO CAPS
25.0000 mg | ORAL_CAPSULE | Freq: Once | ORAL | Status: AC
  Administered 2014-09-25: 25 mg via ORAL
  Filled 2014-09-25: qty 1

## 2014-09-25 MED ORDER — DIPHENHYDRAMINE HCL 25 MG PO CAPS: 25.0000 mg | ORAL_CAPSULE | Freq: Four times a day (QID) | ORAL | Status: DC | PRN

## 2014-09-25 NOTE — Discharge Instructions (Signed)
Hives Take Benadryl for rash. Return for any difficulty breathing, tongue or throat swelling.  follow-up with your primary care physician. Hives are itchy, red, puffy (swollen) areas of the skin. Hives can change in size and location on your body. Hives can come and go for hours, days, or weeks. Hives do not spread from person to person (noncontagious). Scratching, exercise, and stress can make your hives worse. HOME CARE  Avoid things that cause your hives (triggers).  Take antihistamine medicines as told by your doctor. Do not drive while taking an antihistamine.  Take any other medicines for itching as told by your doctor.  Wear loose-fitting clothing.  Keep all doctor visits as told. GET HELP RIGHT AWAY IF:   You have a fever.  Your tongue or lips are puffy.  You have trouble breathing or swallowing.  You feel tightness in the throat or chest.  You have belly (abdominal) pain.  You have lasting or severe itching that is not helped by medicine.  You have painful or puffy joints. These problems may be the first sign of a life-threatening allergic reaction. Call your local emergency services (911 in [place] MAKE SURE YOU:   Understand these instructions.  Will watch your condition.  Will get help right away if you are not doing well or get worse. Document Released: 11/27/2007 Document Revised: 08/19/2011 Document Reviewed: 05/13/2011 [facility] Patient Information 2015 [place], [place]. This information is not intended to replace advice given to you by your health care provider. Make sure you discuss any questions you have with your health care provider.

## 2014-09-25 NOTE — ED Notes (Signed)
Pt here for intermittent hives and facial swelling over the past few days. Unsure of what could have caused the reaction.

## 2014-09-25 NOTE — Telephone Encounter (Signed)
Patient called c/o itchy rash all over his body that is worse in the evening. Offered him an appointment and he said he does not have a way to get here. He thinks it is caused by stress. Advised him to try benadryl and he said he does not have any money until 10/02/14. Asked him to see if he can get a ride and if so call back for an appointment. Offered him an appt with our counselor because he says he feels alone and has a lot of stress, he said he will call back after the 1rst to arrange an appointment.

## 2014-09-25 NOTE — ED Provider Notes (Signed)
CSN: [id number]     Arrival date & time 09/25/14  1245 History  This chart was scribed for Ottie Glazier, PA-C working with Elnora Morrison, MD by Randa Evens, ED Scribe. This patient was seen in room TR08C/TR08C and the patient's care was started at 1:58 PM.      Chief Complaint  Patient presents with  . Allergic Reaction   Patient is a 54 y.o. male presenting with allergic reaction. The history is provided by the patient. No language interpreter was used.  Allergic Reaction Presenting symptoms: rash   Presenting symptoms: no difficulty swallowing    HPI Comments: Dean Carlson is a 54 y.o. male with a history of HIV who presents to the Emergency Department complaining of possible allergic reaction onset 2 days prior. Pt states that he has been breaking out in hives. He states that he has generalized itching. Pt states that his symptoms are worse at night when going to bed. Pt reports new laundry detergent at the beginning of this month. Pt reports recently being on new prostate medication earlier this month but has stopped taking it 3 days after having it prescribed because of an allergic reaction.  The reaction resolved soon after he stopped the medication. Pt doesn't report any medications PTA. Pt denies facial swelling, trouble swallowing, SOB or other related symptoms.   Past Medical History  Diagnosis Date  . HIV (human immunodeficiency virus infection)   . Asthma     as a child  . Prostate cancer    Past Surgical History  Procedure Laterality Date  . Colonoscopy  05/13/2011    Procedure: COLONOSCOPY;  Surgeon: Jerene Bears, MD;  Location: [facility] ENDOSCOPY;  Service: Gastroenterology;  Laterality: N/A;  . Prostate biopsy  08/16/2012   Family History  Problem Relation Age of Onset  . Diabetes    . Cancer Neg Hx    History  Substance Use Topics  . Smoking status: Current Some Day Smoker -- 1.00 packs/day    Types: Cigarettes    Last Attempt to Quit: 03/15/2011  . Smokeless  tobacco: Never Used  . Alcohol Use: No     Comment: occ    Review of Systems  HENT: Negative for facial swelling and trouble swallowing.   Respiratory: Negative for shortness of breath.   Skin: Positive for rash.    Allergies  Alka-seltzer and Aspirin-acetaminophen-caffeine  Home Medications   Prior to Admission medications   Medication Sig Start Date End Date Taking? Authorizing Provider  ATRIPLA [phone] MG per tablet TAKE 1 TABLET BY MOUTH EVERY NIGHT AT BEDTIME 06/22/14   Truman Hayward, MD  bicalutamide (CASODEX) 50 MG tablet  06/22/14   Historical Provider, MD  diphenhydrAMINE (BENADRYL) 25 mg capsule Take 1 capsule (25 mg total) by mouth every 6 (six) hours as needed. 09/25/14   Asencion Guisinger Patel-Mills, PA-C  valACYclovir (VALTREX) 1000 MG tablet Take 1 tablet (1,000 mg total) by mouth 3 (three) times daily. 08/28/14   Campbell Riches, MD   BP 116/70 mmHg  Pulse 100  Temp(Src) 98.2 F (36.8 C) (Oral)  Resp 16  SpO2 100%   Physical Exam  Constitutional: He is oriented to person, place, and time. He appears well-developed and well-nourished. No distress.  HENT:  Head: Normocephalic and atraumatic.  Mouth/Throat: Oropharynx is clear and moist.  No throat or tongue swelling , no drooling, no tripoding. No shortness of breath or difficulty breathing. No facial swelling.  Eyes: Conjunctivae and EOM are normal.  Neck:  Neck supple. No tracheal deviation present.  Cardiovascular: Normal rate.   Pulmonary/Chest: Effort normal and breath sounds normal. No respiratory distress. He has no wheezes.  No difficulty breathing.   Musculoskeletal: Normal range of motion.  Neurological: He is alert and oriented to person, place, and time.  Skin: Skin is warm and dry. Rash noted.  Minimal erythema that appears to be a 2x2cm area of hives on bilateral clavicles. No other rash anywhere else on the body.  Psychiatric: He has a normal mood and affect. His behavior is normal.  Nursing note  and vitals reviewed.   ED Course  Procedures (including critical care time) DIAGNOSTIC STUDIES: Oxygen Saturation is 100% on RA, normal by my interpretation.    COORDINATION OF CARE: 2:08 PM-Discussed treatment plan with pt at bedside and pt agreed to plan.     Labs Review Labs Reviewed - No data to display  Imaging Review No results found.   EKG Interpretation None      MDM   Final diagnoses:  Urticaria   Patient presents for rash. He states he tried new Detergent but has been using it for the past month without any problems. No new medications. I could not identify any factors that might be causing the rash. I did discuss strict return precautions. I gave the patient Benadryl here and wrote him a prescription for it. He can also follow up with his primary care physician and verbally agrees with the plan. I personally performed the services described in this documentation, which was scribed in my presence. The recorded information has been reviewed and is accurate.     Ottie Glazier, PA-C 09/25/14 1940  Elnora Morrison, MD 09/26/14 [phone]

## 2014-09-26 ENCOUNTER — Emergency Department (HOSPITAL_COMMUNITY)
Admission: EM | Admit: 2014-09-26 | Discharge: 2014-09-26 | Disposition: A | Discharge status: Home / Self Care | Payer: Medicare Other | Attending: Emergency Medicine | Admitting: Emergency Medicine

## 2014-09-26 ENCOUNTER — Encounter (HOSPITAL_COMMUNITY): Payer: Self-pay | Admitting: *Deleted

## 2014-09-26 DIAGNOSIS — L509 Urticaria, unspecified: Secondary | ICD-10-CM

## 2014-09-26 DIAGNOSIS — L299 Pruritus, unspecified: Secondary | ICD-10-CM | POA: Diagnosis present

## 2014-09-26 DIAGNOSIS — T7840XA Allergy, unspecified, initial encounter: Secondary | ICD-10-CM | POA: Diagnosis not present

## 2014-09-26 DIAGNOSIS — J45909 Unspecified asthma, uncomplicated: Secondary | ICD-10-CM | POA: Diagnosis not present

## 2014-09-26 DIAGNOSIS — Z79899 Other long term (current) drug therapy: Secondary | ICD-10-CM | POA: Diagnosis not present

## 2014-09-26 DIAGNOSIS — Z8546 Personal history of malignant neoplasm of prostate: Secondary | ICD-10-CM | POA: Diagnosis not present

## 2014-09-26 DIAGNOSIS — Z720 Tobacco use: Secondary | ICD-10-CM | POA: Diagnosis not present

## 2014-09-26 DIAGNOSIS — Z21 Asymptomatic human immunodeficiency virus [HIV] infection status: Secondary | ICD-10-CM | POA: Diagnosis not present

## 2014-09-26 MED ORDER — LORATADINE 10 MG PO TABS
10.0000 mg | ORAL_TABLET | Freq: Once | ORAL | Status: AC
  Administered 2014-09-26: 10 mg via ORAL
  Filled 2014-09-26 (×2): qty 1

## 2014-09-26 MED ORDER — DEXAMETHASONE SODIUM PHOSPHATE 10 MG/ML IJ SOLN
10.0000 mg | Freq: Once | INTRAMUSCULAR | Status: AC
  Administered 2014-09-26: 10 mg via INTRAVENOUS
  Filled 2014-09-26: qty 1

## 2014-09-26 MED ORDER — METHYLPREDNISOLONE SODIUM SUCC 125 MG IJ SOLR
125.0000 mg | Freq: Once | INTRAMUSCULAR | Status: AC
  Administered 2014-09-26: 125 mg via INTRAVENOUS
  Filled 2014-09-26: qty 2

## 2014-09-26 MED ORDER — DIPHENHYDRAMINE HCL 50 MG/ML IJ SOLN
25.0000 mg | Freq: Once | INTRAMUSCULAR | Status: AC
  Administered 2014-09-26: 25 mg via INTRAVENOUS
  Filled 2014-09-26: qty 1

## 2014-09-26 MED ORDER — FAMOTIDINE IN NACL 20-0.9 MG/50ML-% IV SOLN
20.0000 mg | Freq: Once | INTRAVENOUS | Status: AC
  Administered 2014-09-26: 20 mg via INTRAVENOUS
  Filled 2014-09-26: qty 50

## 2014-09-26 NOTE — Discharge Instructions (Signed)
Take Claritin or Zyrtec once a day - they are long-acting, non-sedating antihistamines. Store brands work as well as the name brands, and are less expensive.  Take diphenhydramine (Benadryl) as needed for itching not controlled by Claritin or Zyrtec.  Hives Hives are itchy, red, swollen areas of the skin. They can vary in size and location on your body. Hives can come and go for hours or several days (acute hives) or for several weeks (chronic hives). Hives do not spread from person to person (noncontagious). They may get worse with scratching, exercise, and emotional stress. CAUSES   Allergic reaction to food, additives, or drugs.  Infections, including the common cold.  Illness, such as vasculitis, lupus, or thyroid disease.  Exposure to sunlight, heat, or cold.  Exercise.  Stress.  Contact with chemicals. SYMPTOMS   Red or white swollen patches on the skin. The patches may change size, shape, and location quickly and repeatedly.  Itching.  Swelling of the hands, feet, and face. This may occur if hives develop deeper in the skin. DIAGNOSIS  Your caregiver can usually tell what is wrong by performing a physical exam. Skin or blood tests may also be done to determine the cause of your hives. In some cases, the cause cannot be determined. TREATMENT  Mild cases usually get better with medicines such as antihistamines. Severe cases may require an emergency epinephrine injection. If the cause of your hives is known, treatment includes avoiding that trigger.  HOME CARE INSTRUCTIONS   Avoid causes that trigger your hives.  Take antihistamines as directed by your caregiver to reduce the severity of your hives. Non-sedating or low-sedating antihistamines are usually recommended. Do not drive while taking an antihistamine.  Take any other medicines prescribed for itching as directed by your caregiver.  Wear loose-fitting clothing.  Keep all follow-up appointments as directed by your  caregiver. SEEK MEDICAL CARE IF:   You have persistent or severe itching that is not relieved with medicine.  You have painful or swollen joints. SEEK IMMEDIATE MEDICAL CARE IF:   You have a fever.  Your tongue or lips are swollen.  You have trouble breathing or swallowing.  You feel tightness in the throat or chest.  You have abdominal pain. These problems may be the first sign of a life-threatening allergic reaction. Call your local emergency services (911 in [place] MAKE SURE YOU:   Understand these instructions.  Will watch your condition.  Will get help right away if you are not doing well or get worse. Document Released: 02/17/2005 Document Revised: 02/22/2013 Document Reviewed: 05/13/2011 [facility] Patient Information 2015 [place], [place]. This information is not intended to replace advice given to you by your health care provider. Make sure you discuss any questions you have with your health care provider.

## 2014-09-26 NOTE — ED Notes (Signed)
Dr. Glick at bedside.  

## 2014-09-26 NOTE — ED Notes (Addendum)
Pt in via EMS for hives that started back tonight around 1130pm, pt was seen a few days ago for same and hives improved, no distress noted, pt given 50mg  benadryl PO from EMS

## 2014-09-26 NOTE — ED Notes (Signed)
Pt resting at this time.

## 2014-09-26 NOTE — ED Notes (Signed)
Pt c/o increased itching and headache

## 2014-09-26 NOTE — ED Notes (Signed)
EDP at the bedside.  ?

## 2014-09-26 NOTE — ED Provider Notes (Signed)
CSN: [id number]     Arrival date & time 09/26/14  0053 History   First MD Initiated Contact with Patient 09/26/14 0540     Chief Complaint  Patient presents with  . Urticaria     (Consider location/radiation/quality/duration/timing/severity/associated sxs/prior Treatment) Patient is a 54 y.o. male presenting with urticaria. The history is provided by the patient.  Urticaria  He states that he set a breakout in hives 3 days ago. He is complaining of itching and some difficulty breathing and swallowing. He was seen in the ED yesterday and given diphenhydramine which did give temporary relief. He was given a prescription for diphenhydramine but states he could not afford to get it filled here and is complaining of ongoing itching. He is not sure what he may have reacted to. He has not had problems with hives before.  Past Medical History  Diagnosis Date  . HIV (human immunodeficiency virus infection)   . Asthma     as a child  . Prostate cancer    Past Surgical History  Procedure Laterality Date  . Colonoscopy  05/13/2011    Procedure: COLONOSCOPY;  Surgeon: Jerene Bears, MD;  Location: [facility];  Service: Gastroenterology;  Laterality: N/A;  . Prostate biopsy  08/16/2012   Family History  Problem Relation Age of Onset  . Diabetes    . Cancer Neg Hx    History  Substance Use Topics  . Smoking status: Current Some Day Smoker -- 1.00 packs/day    Types: Cigarettes    Last Attempt to Quit: 03/15/2011  . Smokeless tobacco: Never Used  . Alcohol Use: No     Comment: occ    Review of Systems  All other systems reviewed and are negative.     Allergies  Alka-seltzer and Aspirin-acetaminophen-caffeine  Home Medications   Prior to Admission medications   Medication Sig Start Date End Date Taking? Authorizing Provider  ATRIPLA [phone] MG per tablet TAKE 1 TABLET BY MOUTH EVERY NIGHT AT BEDTIME 06/22/14   Truman Hayward, MD  bicalutamide (CASODEX) 50 MG tablet   06/22/14   Historical Provider, MD  diphenhydrAMINE (BENADRYL) 25 mg capsule Take 1 capsule (25 mg total) by mouth every 6 (six) hours as needed. 09/25/14   Hanna Patel-Mills, PA-C  valACYclovir (VALTREX) 1000 MG tablet Take 1 tablet (1,000 mg total) by mouth 3 (three) times daily. 08/28/14   Campbell Riches, MD   BP 132/74 mmHg  Pulse 87  Temp(Src) 98.5 F (36.9 C) (Oral)  Resp 20  SpO2 100% Physical Exam  Nursing note and vitals reviewed.  54 year old male, resting comfortably and in no acute distress. Vital signs are normal. Oxygen saturation is 100%, which is normal. Head is normocephalic and atraumatic. PERRLA, EOMI. Oropharynx is clear. There is no edema of the soft palate or uvula. No difficulty with secretions. Phonation is normal. Neck is nontender and supple without adenopathy or JVD. Back is nontender and there is no CVA tenderness. Lungs are clear without rales, wheezes, or rhonchi. Chest is nontender. Heart has regular rate and rhythm without murmur. Abdomen is soft, flat, nontender without masses or hepatosplenomegaly and peristalsis is normoactive. Extremities have no cyanosis or edema, full range of motion is present. Skin is warm and dry. Multiple urticarial lesions are seen with some areas of confluence. Neurologic: Mental status is normal, cranial nerves are intact, there are no motor or sensory deficits.  ED Course  Procedures (including critical care time)   MDM  Final diagnoses:  Urticaria    Urticaria. Old records are reviewed confirming that he was seen yesterday for urticaria and treated with diphenhydramine. He will be given diphenhydramine, famotidine, and methylprednisolone in the emergency department. He states that he will not be able to get any prescriptions filled for approximately another 5 days. Once she has achieved reasonable relief, will give him a dose of dexamethasone to try to provide longer acting coverage.  He feels much better after  above noted treatment. He states that he does not have enough money to buy over-the-counter diphenhydramine. Therefore, he will be given a dose of loratadine to give long-acting antihistaminic Evarts today in addition to the dexamethasone.  Delora Fuel, MD 11/57/26 2035

## 2014-09-26 NOTE — ED Notes (Signed)
Informed Dr.Glick of hives and increased itching

## 2014-09-27 ENCOUNTER — Other Ambulatory Visit: Payer: Self-pay | Admitting: Infectious Disease

## 2014-09-27 DIAGNOSIS — B20 Human immunodeficiency virus [HIV] disease: Secondary | ICD-10-CM

## 2014-09-28 ENCOUNTER — Encounter: Payer: Self-pay | Admitting: Infectious Diseases

## 2014-09-28 ENCOUNTER — Ambulatory Visit (INDEPENDENT_AMBULATORY_CARE_PROVIDER_SITE_OTHER): Payer: Medicare Other | Admitting: Infectious Diseases

## 2014-09-28 VITALS — BP 122/78 | HR 83 | Temp 98.4°F | Wt 215.5 lb

## 2014-09-28 DIAGNOSIS — T7840XA Allergy, unspecified, initial encounter: Secondary | ICD-10-CM | POA: Diagnosis present

## 2014-09-28 DIAGNOSIS — R972 Elevated prostate specific antigen [PSA]: Secondary | ICD-10-CM | POA: Diagnosis not present

## 2014-09-28 DIAGNOSIS — F0630 Mood disorder due to known physiological condition, unspecified: Secondary | ICD-10-CM

## 2014-09-28 DIAGNOSIS — B20 Human immunodeficiency virus [HIV] disease: Secondary | ICD-10-CM | POA: Diagnosis not present

## 2014-09-28 MED ORDER — METHYLPREDNISOLONE SODIUM SUCC 1000 MG IJ SOLR
125.0000 mg | Freq: Once | INTRAMUSCULAR | Status: AC
  Administered 2014-09-28: 130 mg via INTRAMUSCULAR

## 2014-09-28 MED ORDER — METHYLPREDNISOLONE SODIUM SUCC 125 MG IJ SOLR: 125.0000 mg | Freq: Once | INTRAMUSCULAR | Status: DC

## 2014-09-28 MED ORDER — PREDNISONE 10 MG (21) PO TBPK: 10.0000 mg | ORAL_TABLET | Freq: Every day | ORAL | Status: DC

## 2014-09-28 NOTE — Assessment & Plan Note (Signed)
I cautioned him that the steroids may significantly alter his mood.  Will monitor

## 2014-09-28 NOTE — Progress Notes (Signed)
   Subjective:    Patient ID: Dean Carlson, male    DOB: 1960/06/09, 54 y.o.   MRN: [id number]  HPI 54 yo M with hx of HIV+ on atripla as well as prostate CA T2N1. Has been getting anti-androgen injections for ~ 1 year.  He has been having hives for last 5 days. Has been fish (but no different for last 3 yrs). Ate a slim jim on Friday, pork rinds, potatoe chips.  Started using new laundry detergent earlier in the month. No new shampoo or body soap (dove, tone). No new deoderants, aftershave.  Feels like his throat is closing up, having more trouble swallowing last 24h (solids). No SOB or cough.   Was seen in ED 7-26 and 7-24.  Was started on benadryl.  Received no prednisone. There are no steroids listed in his med hx.  No new sex partners.   Review of Systems  Constitutional: Negative for fever, chills, appetite change and unexpected weight change.  HENT: Positive for congestion.   Respiratory: Negative for cough and shortness of breath.   Gastrointestinal: Negative for diarrhea and constipation.  Genitourinary: Negative for difficulty urinating.  see HPI, otherwise 12 points reviewed.      Objective:   Physical Exam  Constitutional: He appears well-developed and well-nourished.  HENT:  Mouth/Throat: No oropharyngeal exudate.  Eyes: EOM are normal. Pupils are equal, round, and reactive to light.  Neck: Neck supple.  Cardiovascular: Normal rate, regular rhythm and normal heart sounds.   Pulmonary/Chest: Effort normal and breath sounds normal.  Abdominal: Soft. Bowel sounds are normal. There is no tenderness. There is no rebound.  Musculoskeletal: He exhibits no edema.  Lymphadenopathy:    He has no cervical adenopathy.  Skin: Rash noted. Rash is urticarial.      Assessment & Plan:

## 2014-09-28 NOTE — Addendum Note (Signed)
Addended by: Lorne Skeens D on: 09/28/2014 10:59 AM   Modules accepted: Orders, Medications

## 2014-09-28 NOTE — Assessment & Plan Note (Signed)
Will give him solumedrol now Will start him on steroid taper Stop new detergent Call immediately or go to ED if his sx worsen in any way

## 2014-09-28 NOTE — Assessment & Plan Note (Signed)
He is on no new rx.  Appears stable.  Will f/u with his PCP

## 2014-09-28 NOTE — Assessment & Plan Note (Signed)
He will continue to f/u with Dr [name] Defer til his allergic reaction is over (and to Dr [name]) to consider changing him to more modern ART

## 2014-10-19 DIAGNOSIS — C61 Malignant neoplasm of prostate: Secondary | ICD-10-CM | POA: Diagnosis not present

## 2014-10-26 DIAGNOSIS — C61 Malignant neoplasm of prostate: Secondary | ICD-10-CM | POA: Diagnosis not present

## 2014-11-08 ENCOUNTER — Other Ambulatory Visit: Payer: Medicare Other

## 2014-11-08 ENCOUNTER — Other Ambulatory Visit (HOSPITAL_COMMUNITY)
Admission: RE | Admit: 2014-11-08 | Discharge: 2014-11-08 | Disposition: A | Discharge status: Home / Self Care | Payer: Medicare Other | Source: Ambulatory Visit | Attending: Infectious Disease | Admitting: Infectious Disease

## 2014-11-08 DIAGNOSIS — E785 Hyperlipidemia, unspecified: Secondary | ICD-10-CM | POA: Diagnosis not present

## 2014-11-08 DIAGNOSIS — B20 Human immunodeficiency virus [HIV] disease: Secondary | ICD-10-CM

## 2014-11-08 DIAGNOSIS — Z113 Encounter for screening for infections with a predominantly sexual mode of transmission: Secondary | ICD-10-CM | POA: Insufficient documentation

## 2014-11-08 LAB — CBC WITH DIFFERENTIAL/PLATELET
BASOS PCT: 0 % (ref 0–1)
Basophils Absolute: 0 10*3/uL (ref 0.0–0.1)
EOS ABS: 0.1 10*3/uL (ref 0.0–0.7)
Eosinophils Relative: 2 % (ref 0–5)
HCT: 35.2 % — ABNORMAL LOW (ref 39.0–52.0)
HEMOGLOBIN: 11.5 g/dL — AB (ref 13.0–17.0)
Lymphocytes Relative: 31 % (ref 12–46)
Lymphs Abs: 1.7 10*3/uL (ref 0.7–4.0)
MCH: 30 pg (ref 26.0–34.0)
MCHC: 32.7 g/dL (ref 30.0–36.0)
MCV: 91.9 fL (ref 78.0–100.0)
MONOS PCT: 12 % (ref 3–12)
MPV: 10.1 fL (ref 8.6–12.4)
Monocytes Absolute: 0.7 10*3/uL (ref 0.1–1.0)
NEUTROS ABS: 3.1 10*3/uL (ref 1.7–7.7)
Neutrophils Relative %: 55 % (ref 43–77)
PLATELETS: 225 10*3/uL (ref 150–400)
RBC: 3.83 MIL/uL — AB (ref 4.22–5.81)
RDW: 15.2 % (ref 11.5–15.5)
WBC: 5.6 10*3/uL (ref 4.0–10.5)

## 2014-11-08 LAB — COMPLETE METABOLIC PANEL WITH GFR
ALT: 23 U/L (ref 9–46)
AST: 19 U/L (ref 10–35)
Albumin: 3.8 g/dL (ref 3.6–5.1)
Alkaline Phosphatase: 115 U/L (ref 40–115)
BUN: 17 mg/dL (ref 7–25)
CO2: 29 mmol/L (ref 20–31)
Calcium: 8.7 mg/dL (ref 8.6–10.3)
Chloride: 103 mmol/L (ref 98–110)
Creat: 0.69 mg/dL — ABNORMAL LOW (ref 0.70–1.33)
GFR, Est African American: >89 mL/min (ref >=60)
GLUCOSE: 118 mg/dL — AB (ref 65–99)
POTASSIUM: 4.4 mmol/L (ref 3.5–5.3)
SODIUM: 142 mmol/L (ref 135–146)
Total Bilirubin: 0.2 mg/dL (ref 0.2–1.2)
Total Protein: 6.3 g/dL (ref 6.1–8.1)

## 2014-11-08 LAB — LIPID PANEL
CHOL/HDL RATIO: 4.6 ratio (ref <=5.0)
Cholesterol: 172 mg/dL (ref 125–200)
HDL: 37 mg/dL — AB (ref >=40)
LDL Cholesterol: 85 mg/dL (ref <130)
Triglycerides: 249 mg/dL — ABNORMAL HIGH (ref <150)
VLDL: 50 mg/dL — AB (ref <30)

## 2014-11-09 LAB — RPR

## 2014-11-09 LAB — URINE CYTOLOGY ANCILLARY ONLY
Chlamydia: NEGATIVE
Neisseria Gonorrhea: NEGATIVE

## 2014-11-09 LAB — T-HELPER CELL (CD4) - (RCID CLINIC ONLY)
CD4 % Helper T Cell: 26 % — ABNORMAL LOW (ref 33–55)
CD4 T Cell Abs: 400 /uL (ref 400–2700)

## 2014-11-09 LAB — HIV-1 RNA QUANT-NO REFLEX-BLD
HIV 1 RNA Quant: <20 copies/mL (ref <20)
HIV-1 RNA Quant, Log: <1.3 {Log} (ref <1.30)

## 2014-11-22 ENCOUNTER — Encounter: Payer: Self-pay | Admitting: Infectious Disease

## 2014-11-22 ENCOUNTER — Ambulatory Visit (INDEPENDENT_AMBULATORY_CARE_PROVIDER_SITE_OTHER): Payer: Medicare Other | Admitting: Infectious Disease

## 2014-11-22 VITALS — BP 120/79 | HR 84 | Temp 98.2°F | Wt 217.0 lb

## 2014-11-22 DIAGNOSIS — C61 Malignant neoplasm of prostate: Secondary | ICD-10-CM

## 2014-11-22 DIAGNOSIS — E785 Hyperlipidemia, unspecified: Secondary | ICD-10-CM

## 2014-11-22 DIAGNOSIS — Z23 Encounter for immunization: Secondary | ICD-10-CM | POA: Diagnosis not present

## 2014-11-22 DIAGNOSIS — B20 Human immunodeficiency virus [HIV] disease: Secondary | ICD-10-CM

## 2014-11-22 DIAGNOSIS — T7840XS Allergy, unspecified, sequela: Secondary | ICD-10-CM

## 2014-11-22 NOTE — Progress Notes (Signed)
Chief complaint: followup for allergic rxn, HIV on meds and prostate cancer    Subjective:    Patient ID: Dean Carlson, male    DOB: Sep 28, 1960, 54 y.o.   MRN: [id number]  HPI   Dean Carlson is a 54 y.o. male who is doing superbly well on his antiviral regimen, ATRIPLA with undetectable viral load   He has been diagnosed with T2c N1 adenocarcinoma of the prostate with a Gleason's score of 4+5 which put him in high risk group.    He he had been receiving lupron for chemical castration and has finished with XRT by Dr. Tammi Klippel  His CD4 has risen since coming off XRT and VL is undetectable.  He is supposed to be taking casodex but he is reluctant to start this medicine as the lupron made him tired and gain weight-he feels.   I encouraged him strongly to start the casodex as I would like his prostate cancer to be under control.  He had an odd allergic reaction and saw my partner Dr. Johnnye Sima and was given steroids with resolution of symptoms.  He is doing well now. We discussed other options again.  Past Medical History  Diagnosis Date  . HIV (human immunodeficiency virus infection)   . Asthma     as a child  . Prostate cancer    Past Surgical History  Procedure Laterality Date  . Colonoscopy  05/13/2011    Procedure: COLONOSCOPY;  Surgeon: Jerene Bears, MD;  Location: [facility];  Service: Gastroenterology;  Laterality: N/A;  . Prostate biopsy  08/16/2012   Family History  Problem Relation Age of Onset  . Diabetes    . Cancer Neg Hx    Social History  Substance Use Topics  . Smoking status: Former Smoker -- 1.00 packs/day    Types: Cigarettes    Quit date: 03/15/2011  . Smokeless tobacco: Never Used  . Alcohol Use: No     Comment: occ   Allergies  Allergen Reactions  . Alka-Seltzer [Aspirin Effervescent] Other (See Comments)    asthma  . Aspirin-Acetaminophen-Caffeine     REACTION: asthma    Current outpatient prescriptions:  .  ATRIPLA 600-200-300 MG per  tablet, TAKE 1 TABLET BY MOUTH EVERY NIGHT AT BEDTIME, Disp: 30 tablet, Rfl: 5 .  enzalutamide (XTANDI) 40 MG capsule, Take 160 mg by mouth daily., Disp: , Rfl:  .  Multiple Vitamin (MULTIVITAMIN WITH MINERALS) TABS tablet, Take 1 tablet by mouth daily., Disp: , Rfl:    Lab Results  Component Value Date   HIV1RNAQUANT <20 11/08/2014   Lab Results  Component Value Date   CD4TABS 400 11/08/2014   CD4TABS 330* 07/06/2014   CD4TABS 240* 12/21/2013      Review of Systems  Constitutional: Negative for fever, chills, diaphoresis, activity change, appetite change, fatigue and unexpected weight change.  HENT: Negative for congestion, rhinorrhea, sinus pressure, sneezing, sore throat and trouble swallowing.   Eyes: Negative for photophobia and visual disturbance.  Respiratory: Negative for cough, chest tightness, shortness of breath, wheezing and stridor.   Cardiovascular: Negative for chest pain, palpitations and leg swelling.  Gastrointestinal: Negative for nausea, vomiting, abdominal pain, diarrhea, constipation, blood in stool, abdominal distention and anal bleeding.  Genitourinary: Negative for dysuria, hematuria, flank pain and difficulty urinating.  Musculoskeletal: Negative for myalgias, back pain, joint swelling, arthralgias and gait problem.  Skin: Negative for color change, pallor, rash and wound.  Neurological: Negative for dizziness, tremors, weakness and light-headedness.  Hematological: Negative for adenopathy.  Does not bruise/bleed easily.  Psychiatric/Behavioral: Negative for behavioral problems, confusion, sleep disturbance and decreased concentration.       Objective:   Physical Exam  Constitutional: He appears well-developed and well-nourished.  Eyes: EOM are normal.  Neck: Normal range of motion. No thyromegaly present.  Cardiovascular: Normal rate and regular rhythm.   Pulmonary/Chest: Effort normal and breath sounds normal.  Abdominal: Soft. Bowel sounds are  normal.  Musculoskeletal: Normal range of motion. He exhibits no edema or tenderness.  Neurological: He is alert. No cranial nerve deficit.  Skin: No rash noted.  Psychiatric: He has a normal mood and affect. His behavior is normal. Judgment and thought content normal.  Nursing note and vitals reviewed.         Assessment & Plan:  HIV: will check an HLA B701 negative and consider change to [facility]  Prostate cancer: on anti-androgen therapy and sp XRT, on meds  Depression anxiety: feels he is coping with this well at present.  I spent greater than 25 minutes with the patient including greater than 50% of time in face to face counsel of the patient re his HIV, ARV options, prostate cancer, depression  and in coordination of their care.

## 2014-11-27 ENCOUNTER — Telehealth: Payer: Self-pay | Admitting: *Deleted

## 2014-11-27 NOTE — Telephone Encounter (Signed)
Patient called to let Dr. [name] though that he is stopping the medication prescribed by the urologist because he feels "it is killing him". Advised patient I will let Dr. [name] know and also told him he should speak to his urologist about this. He said he already did. Myrtis Hopping

## 2014-11-27 NOTE — Telephone Encounter (Signed)
I hope he follows up with Urology and gets on another medicine

## 2014-11-30 ENCOUNTER — Telehealth: Payer: Self-pay | Admitting: Oncology

## 2014-11-30 NOTE — Telephone Encounter (Signed)
New patient appt-s/w patient and gave np appt for 11/8 @ 10:30 w/Dr. Alen Blew.  Referring Dr. Lowella Bandy Dx- Prostate Ca- rising psa  Referral information scanned

## 2015-01-09 ENCOUNTER — Other Ambulatory Visit: Payer: Medicare Other

## 2015-01-09 ENCOUNTER — Telehealth: Payer: Self-pay | Admitting: *Deleted

## 2015-01-09 ENCOUNTER — Ambulatory Visit: Payer: Medicare Other | Admitting: Oncology

## 2015-01-09 NOTE — Telephone Encounter (Signed)
Patient called stating he took his Atripla last night at 9:00 pm and then mistakenly took it this morning at 9:50 AM (mistook it for his vitamin). He wants to know what he should do about tonight's dose. Myrtis Hopping

## 2015-01-09 NOTE — Telephone Encounter (Signed)
RN spoke with the pt about his medication.  RN advised pt to not take his dose tonight and restart the nightly dose tomorrow evening.  Pt verbalized understanding.

## 2015-01-29 ENCOUNTER — Telehealth: Payer: Self-pay | Admitting: *Deleted

## 2015-01-29 NOTE — Telephone Encounter (Signed)
This RN spoke with patient reminding him of his appointment tomorrow with Dr. Alen Blew. Patient verbalized understanding.

## 2015-01-30 ENCOUNTER — Ambulatory Visit (HOSPITAL_BASED_OUTPATIENT_CLINIC_OR_DEPARTMENT_OTHER): Payer: Medicare Other | Admitting: Oncology

## 2015-01-30 ENCOUNTER — Telehealth: Payer: Self-pay | Admitting: Pharmacist

## 2015-01-30 ENCOUNTER — Ambulatory Visit (HOSPITAL_BASED_OUTPATIENT_CLINIC_OR_DEPARTMENT_OTHER): Payer: Medicare Other

## 2015-01-30 ENCOUNTER — Other Ambulatory Visit: Payer: Medicare Other

## 2015-01-30 ENCOUNTER — Telehealth: Payer: Self-pay | Admitting: Oncology

## 2015-01-30 VITALS — BP 132/73 | HR 77 | Temp 98.2°F | Resp 18 | Ht 71.0 in | Wt 223.0 lb

## 2015-01-30 DIAGNOSIS — E291 Testicular hypofunction: Secondary | ICD-10-CM | POA: Diagnosis not present

## 2015-01-30 DIAGNOSIS — C7989 Secondary malignant neoplasm of other specified sites: Secondary | ICD-10-CM

## 2015-01-30 DIAGNOSIS — C61 Malignant neoplasm of prostate: Secondary | ICD-10-CM

## 2015-01-30 MED ORDER — ABIRATERONE ACETATE 250 MG PO TABS: 1000.0000 mg | ORAL_TABLET | Freq: Every day | ORAL | Status: DC

## 2015-01-30 NOTE — Consult Note (Signed)
Reason for Referral: Prostate cancer.   HPI: Dean Carlson is a pleasant 53 year old gentleman currently of [place] where he lived the majority of his life. He is a pleasant man with history of HIV and history of prostate cancer. Prostate cancer dates back to 2014 where he was found to have an elevated PSA of 230. His evaluation showed a palpable nodule at the prostatic base evaluated by Dr. Janice Norrie. Biopsy obtained on 08/16/2012 showed a Gleason score of 4+5 = 9 involving all 12 cores. History of workup did not show any bony metastasis but a 3 cm which are aortic lymph node. He was treated with Lupron therapy and had an excellent response with PSA down to 0.97 back in August 2015. His PSA did rise to 18.76 in April 2016 and most recently up to 30.85 on 10/19/2014. At that time he was started on xtandi and tolerated it poorly. He reported muscle cramps and took it for one week only. He had been off any treatment other than Lupron since that time. His most recent bone scan was done in May 2016 showed a questionable L2 sclerotic lesion although an MRI obtained at that time of the lumbar spine did not show any evidence of bony metastasis. Clinically, he is relatively asymptomatic from his prostate cancer. Does not report any bone pain or discomfort. Has not reported any constitutional symptoms other than fatigue.  He does not report any headaches, blurry vision, syncope or seizures. Does not report any fevers, chills, sweats or weight loss. He does report weight gain and decrease in his muscle mass. Does not report any chest pain, palpitation orthopnea or leg edema. Does not report any cough, hemoptysis or hematemesis. As that report any nausea, vomiting or abdominal pain. Does not report any skeletal complaints of arthralgias or myalgias. Remaining review of systems unremarkable.   Past Medical History  Diagnosis Date  . HIV (human immunodeficiency virus infection)   . Asthma     as a child  . Prostate  cancer   :  Past Surgical History  Procedure Laterality Date  . Colonoscopy  05/13/2011    Procedure: COLONOSCOPY;  Surgeon: Jerene Bears, MD;  Location: [facility];  Service: Gastroenterology;  Laterality: N/A;  . Prostate biopsy  08/16/2012  :   Current outpatient prescriptions:  .  ATRIPLA 600-200-300 MG per tablet, TAKE 1 TABLET BY MOUTH EVERY NIGHT AT BEDTIME, Disp: 30 tablet, Rfl: 5 .  Multiple Vitamin (MULTIVITAMIN WITH MINERALS) TABS tablet, Take 1 tablet by mouth daily., Disp: , Rfl:  .  abiraterone Acetate (ZYTIGA) 250 MG tablet, Take 4 tablets (1,000 mg total) by mouth daily. Take on an empty stomach 1 hour before or 2 hours after a meal, Disp: 120 tablet, Rfl: 0:  Allergies  Allergen Reactions  . Alka-Seltzer [Aspirin Effervescent] Other (See Comments)    asthma  . Aspirin-Acetaminophen-Caffeine     REACTION: asthma  :  Family History  Problem Relation Age of Onset  . Diabetes    . Cancer Neg Hx   :  Social History   Social History  . Marital Status: Legally Separated    Spouse Name: N/A  . Number of Children: N/A  . Years of Education: N/A   Occupational History  . permanant disability    Social History Main Topics  . Smoking status: Former Smoker -- 1.00 packs/day    Types: Cigarettes    Quit date: 03/15/2011  . Smokeless tobacco: Never Used  . Alcohol Use: No  Comment: occ  . Drug Use: No  . Sexual Activity: Not Currently   Other Topics Concern  . Not on file   Social History Narrative  :  Pertinent items are noted in HPI.  Exam: ECOG 1 Blood pressure 132/73, pulse 77, temperature 98.2 F (36.8 C), temperature source Oral, resp. rate 18, height 5\' 11"  (1.803 m), weight 223 lb (101.152 kg), SpO2 100 %. General appearance: alert and cooperative Head: Normocephalic, without obvious abnormality Throat: lips, mucosa, and tongue normal; teeth and gums normal Neck: no adenopathy Back: negative Resp: clear to auscultation  bilaterally Cardio: regular rate and rhythm, S1, S2 normal, no murmur, click, rub or gallop GI: soft, non-tender; bowel sounds normal; no masses,  no organomegaly Extremities: extremities normal, atraumatic, no cyanosis or edema Pulses: 2+ and symmetric Skin: Skin color, texture, turgor normal. No rashes or lesions Lymph nodes: Cervical, supraclavicular, and axillary nodes normal. Neurologic: Grossly normal  CBC    Component Value Date/Time   WBC 5.6 11/08/2014 1000   RBC 3.83* 11/08/2014 1000   HGB 11.5* 11/08/2014 1000   HCT 35.2* 11/08/2014 1000   PLT 225 11/08/2014 1000   MCV 91.9 11/08/2014 1000   MCH 30.0 11/08/2014 1000   MCHC 32.7 11/08/2014 1000   RDW 15.2 11/08/2014 1000   LYMPHSABS 1.7 11/08/2014 1000   MONOABS 0.7 11/08/2014 1000   EOSABS 0.1 11/08/2014 1000   BASOSABS 0.0 11/08/2014 1000      Chemistry      Component Value Date/Time   NA 142 11/08/2014 1000   K 4.4 11/08/2014 1000   CL 103 11/08/2014 1000   CO2 29 11/08/2014 1000   BUN 17 11/08/2014 1000   CREATININE 0.69* 11/08/2014 1000   CREATININE 0.97 10/25/2012 1218      Component Value Date/Time   CALCIUM 8.7 11/08/2014 1000   ALKPHOS 115 11/08/2014 1000   AST 19 11/08/2014 1000   ALT 23 11/08/2014 1000   BILITOT 0.2 11/08/2014 1000       Assessment and Plan:   54 year old gentleman with the following issues:  1. 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 His initial diagnosis back in June 2014 where he presented with a Gleason score 4+5 = 9 and a PSA of 2:30. After a period of androgen deprivation and a PSA nadir of less than 1, he started developing a rapid rise in his PSA and have been intolerant to Erhard.  The natural course of this disease was discussed with the patient and options of therapy were reviewed. He understands any therapy moving forward would be palliative and not curative. These options would include Zytiga, systemic chemotherapy,  Provenge immunotherapy, and bone directed therapy such as Xofigo.   After discussing the risks and benefits of all these approaches, he declined systemic chemotherapy on principle. I really would be reasonable to hold off on chemotherapy given the low volume disease he probably experiencing. Risks and benefits of Zytiga specifically were reviewed. His complications include lower extremity edema, fatigue, adrenal insufficiency among others. We will agreeable to try this medication at least for the time being. Written information was given to the patient.  I would like to obtain a PSA of for baseline purposes also repeat imaging studies which she prefers to do that in January for personal reasons.  2. Androgen depravation: I have recommended continuing that indefinitely.  3. HIV: He is continued to be on Atripla. We will make sure there is no contraindication with Zytiga.  4. Follow-up: Will be in one month to assess any competition related to this medication.

## 2015-01-30 NOTE — Progress Notes (Signed)
Please see consult note.  

## 2015-01-30 NOTE — Telephone Encounter (Signed)
per pof to sch pt appt-gave pt copy of avs-sent back to lab-adv [facility] will call to sch scan

## 2015-01-30 NOTE — Telephone Encounter (Signed)
11/29 - Faxed new Rx for Zytiga over to [facility]

## 2015-01-31 LAB — PSA: PSA: 8.37 ng/mL — AB (ref <=4.00)

## 2015-01-31 NOTE — Telephone Encounter (Signed)
11/30 - Prior Auth approved for Zytiga approval is from 11/02/2014-01/30/2018. Copay at [facility] long outpatient pharmacy is $0. Pharmacy to contact patient for pick up.   Thank you,  Montel Clock, PharmD, [facility]

## 2015-02-05 ENCOUNTER — Encounter: Payer: Self-pay | Admitting: Oncology

## 2015-02-05 NOTE — Progress Notes (Signed)
Faxed request for independent assessment to [phone] on 02/02/15. Forward copy to medical record.

## 2015-02-06 ENCOUNTER — Ambulatory Visit: Payer: Medicare Other | Admitting: Infectious Disease

## 2015-02-09 ENCOUNTER — Telehealth: Payer: Self-pay | Admitting: Pharmacist

## 2015-02-09 DIAGNOSIS — Z5111 Encounter for antineoplastic chemotherapy: Secondary | ICD-10-CM

## 2015-02-09 NOTE — Telephone Encounter (Signed)
12/9 - Patient picked up his Zytiga from [facility] earlier this week. He canceled his appointment with the ID clinic but this has been rescheduled to next week on 12/14. His HIV regimen will likely need to be adjusted to avoid interaction with Zytiga. Patient is aware and he has not started his Fabio Asa and will continue to hold until after his visit next week on 12/14.   Thank you,  Montel Clock, PharmD, [facility]

## 2015-02-14 ENCOUNTER — Ambulatory Visit (INDEPENDENT_AMBULATORY_CARE_PROVIDER_SITE_OTHER): Payer: Medicare Other | Admitting: Infectious Disease

## 2015-02-14 ENCOUNTER — Encounter: Payer: Self-pay | Admitting: Infectious Disease

## 2015-02-14 VITALS — BP 132/84 | HR 85 | Temp 97.7°F | Wt 227.5 lb

## 2015-02-14 DIAGNOSIS — C61 Malignant neoplasm of prostate: Secondary | ICD-10-CM | POA: Diagnosis not present

## 2015-02-14 DIAGNOSIS — B20 Human immunodeficiency virus [HIV] disease: Secondary | ICD-10-CM | POA: Diagnosis not present

## 2015-02-14 DIAGNOSIS — E291 Testicular hypofunction: Secondary | ICD-10-CM | POA: Diagnosis not present

## 2015-02-14 DIAGNOSIS — K625 Hemorrhage of anus and rectum: Secondary | ICD-10-CM

## 2015-02-14 MED ORDER — DOLUTEGRAVIR SODIUM 50 MG PO TABS: 50.0000 mg | ORAL_TABLET | Freq: Every day | ORAL | Status: DC

## 2015-02-14 MED ORDER — EMTRICITABINE-TENOFOVIR AF 200-25 MG PO TABS: 1.0000 | ORAL_TABLET | Freq: Every day | ORAL | Status: DC

## 2015-02-14 NOTE — Patient Instructions (Signed)
Dean Carlson!  I will see you in about 6 weeks

## 2015-02-14 NOTE — Progress Notes (Signed)
Patient ID: Dean Carlson, male   DOB: Apr 03, 1960, 54 y.o.   MRN: [id number] HPI: [name] is a 54 y.o. male who is here for his HIV f/u.   Allergies: Allergies  Allergen Reactions  . Alka-Seltzer [Aspirin Effervescent] Other (See Comments)    asthma  . Aspirin-Acetaminophen-Caffeine     REACTION: asthma    Vitals: Temp: 97.7 F (36.5 C) (12/14 1446) Temp Source: Oral (12/14 1446) BP: 132/84 mmHg (12/14 1446) Pulse Rate: 85 (12/14 1446)  Past Medical History: Past Medical History  Diagnosis Date  . HIV (human immunodeficiency virus infection) ([place])   . Asthma     as a child  . Prostate cancer [facility])     Social History: Social History   Social History  . Marital Status: Legally Separated    Spouse Name: N/A  . Number of Children: N/A  . Years of Education: N/A   Occupational History  . permanant disability    Social History Main Topics  . Smoking status: Former Smoker -- 1.00 packs/day    Types: Cigarettes    Quit date: 03/15/2011  . Smokeless tobacco: Never Used  . Alcohol Use: No     Comment: occ  . Drug Use: No  . Sexual Activity: Not Currently   Other Topics Concern  . None   Social History Narrative    Previous Regimen:   Current Regimen: ATP  Labs: HIV 1 RNA QUANT (copies/mL)  Date Value  11/08/2014 <20  07/06/2014 <20  12/21/2013 <20   CD4 T CELL ABS (/uL)  Date Value  11/08/2014 400  07/06/2014 330*  12/21/2013 240*   HEP B S AB (no units)  Date Value  04/27/2006 No   HEPATITIS B SURFACE AG (no units)  Date Value  04/27/2006 No   HCV AB (no units)  Date Value  12/21/2013 NEGATIVE    CrCl: CrCl cannot be calculated (Patient has no serum creatinine result on file.).  Lipids:    Component Value Date/Time   CHOL 172 11/08/2014 1000   TRIG 249* 11/08/2014 1000   HDL 37* 11/08/2014 1000   CHOLHDL 4.6 11/08/2014 1000   VLDL 50* 11/08/2014 1000   LDLCALC 85 11/08/2014 1000    Assessment: Dean Carlson is here for his  routine f/u. He is super compliant with his ART. He is also on abiraterone for his prostate cancer also. His VL is very well controlled. We discussed about changing his regimen to a TAF based. We can't do Triumeq since he doesn't have an HLA test. He is excited about using Descovy + Tivicay All of his questions were answered.    Recommendations: Dc ATP Descovy 1 PO qday DTG 72m PO qday  PWilfred Lacy PharmD Clinical Infectious [facility] 02/14/2015, 4:10 PM

## 2015-02-14 NOTE — Progress Notes (Signed)
Chief complaint: followup for HIV and prostate cancer Subjective:    Patient ID: Dean Carlson, male    DOB: 1960/04/09, 54 y.o.   MRN: [id number]  HPI  Dean Carlson is 54 year old man with HIV, prostate cancer that was diagnosed while he was on testosterone. He has been followed closely by urology and now by Oncology with Dr. Barbaraann Faster who was beginning [place]. We had checked drug-drug interactions and there was a significant one with EFV with Zytiga so we brought him in to change him a regimen that would not interact with Zytiga.   His ROS + fatigue and also some blood per rectum likely due to hemorrhoid.  We have opted for Tivicay and Descovy.  Past Medical History  Diagnosis Date  . HIV (human immunodeficiency virus infection) ([place])   . Asthma     as a child  . Prostate cancer [facility])     Past Surgical History  Procedure Laterality Date  . Colonoscopy  05/13/2011    Procedure: COLONOSCOPY;  Surgeon: Jerene Bears, MD;  Location: [facility];  Service: Gastroenterology;  Laterality: N/A;  . Prostate biopsy  08/16/2012    Family History  Problem Relation Age of Onset  . Diabetes    . Cancer Neg Hx       Social History   Social History  . Marital Status: Legally Separated    Spouse Name: N/A  . Number of Children: N/A  . Years of Education: N/A   Occupational History  . permanant disability    Social History Main Topics  . Smoking status: Former Smoker -- 1.00 packs/day    Types: Cigarettes    Quit date: 03/15/2011  . Smokeless tobacco: Never Used  . Alcohol Use: No     Comment: occ  . Drug Use: No  . Sexual Activity: Not Currently   Other Topics Concern  . None   Social History Narrative    Allergies  Allergen Reactions  . Alka-Seltzer [Aspirin Effervescent] Other (See Comments)    asthma  . Aspirin-Acetaminophen-Caffeine     REACTION: asthma     Current outpatient prescriptions:  .  abiraterone Acetate (ZYTIGA) 250 MG tablet, Take 4 tablets (1,000 mg  total) by mouth daily. Take on an empty stomach 1 hour before or 2 hours after a meal, Disp: 120 tablet, Rfl: 0 .  dolutegravir (TIVICAY) 50 MG tablet, Take 1 tablet (50 mg total) by mouth daily., Disp: 30 tablet, Rfl: 11 .  emtricitabine-tenofovir AF (DESCOVY) 200-25 MG tablet, Take 1 tablet by mouth daily., Disp: 30 tablet, Rfl: 11 .  Multiple Vitamin (MULTIVITAMIN WITH MINERALS) TABS tablet, Take 1 tablet by mouth daily., Disp: , Rfl:     Review of Systems  Constitutional: Positive for fatigue. Negative for fever, chills, diaphoresis, activity change, appetite change and unexpected weight change.  HENT: Negative for congestion, rhinorrhea, sinus pressure, sneezing, sore throat and trouble swallowing.   Eyes: Negative for photophobia and visual disturbance.  Respiratory: Negative for cough, chest tightness, shortness of breath, wheezing and stridor.   Cardiovascular: Negative for chest pain, palpitations and leg swelling.  Gastrointestinal: Positive for rectal pain. Negative for nausea, vomiting, abdominal pain, diarrhea, constipation, blood in stool, abdominal distention and anal bleeding.  Genitourinary: Negative for dysuria, hematuria, flank pain and difficulty urinating.  Musculoskeletal: Negative for myalgias, back pain, joint swelling, arthralgias and gait problem.  Skin: Negative for color change, pallor, rash and wound.  Neurological: Negative for dizziness, tremors, weakness and light-headedness.  Hematological:  Negative for adenopathy. Does not bruise/bleed easily.  Psychiatric/Behavioral: Negative for behavioral problems, confusion, sleep disturbance, dysphoric mood, decreased concentration and agitation.       Objective:   Physical Exam  Constitutional: He is oriented to person, place, and time. He appears well-developed and well-nourished.  HENT:  Head: Normocephalic and atraumatic.  Eyes: Conjunctivae and EOM are normal.  Neck: Normal range of motion. Neck supple.    Cardiovascular: Normal rate and regular rhythm.   Pulmonary/Chest: Effort normal. No respiratory distress. He has no wheezes.  Abdominal: Soft. He exhibits no distension.  Musculoskeletal: Normal range of motion. He exhibits no edema or tenderness.  Neurological: He is alert and oriented to person, place, and time.  Skin: Skin is warm and dry. No rash noted. No erythema. No pallor.  Psychiatric: He has a normal mood and affect. His behavior is normal. Judgment and thought content normal.          Assessment & Plan:   HIV: change to Tivicay and Descovy and recheck VL and CD4 in one month. This will NOT interact with his prostate cancer drug not much with other drugs  Prostate cancer: to followup with Dr. Barbaraann Faster and fine to go forward with standard doses of zytiga  Rectal bleeding: likely due to hemorrhoid. NO sig anemia or evidence of upper GIB or any vigorous lower GIB  I spent greater than 40 minutes with the patient including greater than 50% of time in face to face counsel of the patient re his new ARV regimen, his prostate cancer his rectal bleeding  and in coordination of his care.

## 2015-02-15 ENCOUNTER — Encounter: Payer: Self-pay | Admitting: Pharmacist

## 2015-02-15 NOTE — Progress Notes (Signed)
Oral Chemotherapy Pharmacist Encounter   I spoke with patient for overview of new oral chemotherapy medication: Zytiga. Pt is doing well. The prescription has been sent to the [facility] and patient has picked up mediation. He was waiting to start therapy until his anti-viral medication regimen was adjusted to avoid drug interaction with Zytiga. This was completed at the ID clinic on 12/14. Patient cleared to start Zytiga and Fabio Asa was initiated on 12/15.   Counseled patient on administration, dosing, side effects, safe handling, and monitoring. Side effects include but not limited to: Hypertension, fatigue, swelling, and muscle aches.  Mr. Timlin voiced understanding and appreciation.   All questions answered.  Will follow up in 2 weeks for adherence and toxicity management.   Thank you,  Montel Clock, PharmD, [facility]

## 2015-02-23 ENCOUNTER — Encounter: Payer: Self-pay | Admitting: Oncology

## 2015-02-23 ENCOUNTER — Encounter: Payer: Self-pay | Admitting: *Deleted

## 2015-02-23 DIAGNOSIS — C61 Malignant neoplasm of prostate: Secondary | ICD-10-CM | POA: Diagnosis not present

## 2015-02-23 NOTE — Progress Notes (Signed)
Faxed form to [facility] [phone]..sent copy to medical records.

## 2015-02-28 ENCOUNTER — Ambulatory Visit (HOSPITAL_BASED_OUTPATIENT_CLINIC_OR_DEPARTMENT_OTHER): Payer: Medicare Other | Admitting: Oncology

## 2015-02-28 ENCOUNTER — Telehealth: Payer: Self-pay | Admitting: Oncology

## 2015-02-28 ENCOUNTER — Other Ambulatory Visit (HOSPITAL_BASED_OUTPATIENT_CLINIC_OR_DEPARTMENT_OTHER): Payer: Medicare Other

## 2015-02-28 VITALS — BP 129/71 | HR 71 | Temp 98.7°F | Resp 18 | Ht 71.0 in | Wt 231.5 lb

## 2015-02-28 DIAGNOSIS — C61 Malignant neoplasm of prostate: Secondary | ICD-10-CM

## 2015-02-28 LAB — CBC WITH DIFFERENTIAL/PLATELET
BASO%: 0.9 % (ref 0.0–2.0)
BASOS ABS: 0 10*3/uL (ref 0.0–0.1)
EOS ABS: 0.1 10*3/uL (ref 0.0–0.5)
EOS%: 1.6 % (ref 0.0–7.0)
HCT: 36 % — ABNORMAL LOW (ref 38.4–49.9)
HGB: 11.9 g/dL — ABNORMAL LOW (ref 13.0–17.1)
LYMPH%: 32.2 % (ref 14.0–49.0)
MCH: 30 pg (ref 27.2–33.4)
MCHC: 33 g/dL (ref 32.0–36.0)
MCV: 90.8 fL (ref 79.3–98.0)
MONO#: 0.6 10*3/uL (ref 0.1–0.9)
MONO%: 12.8 % (ref 0.0–14.0)
NEUT#: 2.6 10*3/uL (ref 1.5–6.5)
NEUT%: 52.5 % (ref 39.0–75.0)
PLATELETS: 193 10*3/uL (ref 140–400)
RBC: 3.96 10*6/uL — AB (ref 4.20–5.82)
RDW: 14.2 % (ref 11.0–14.6)
WBC: 4.9 10*3/uL (ref 4.0–10.3)
lymph#: 1.6 10*3/uL (ref 0.9–3.3)

## 2015-02-28 LAB — COMPREHENSIVE METABOLIC PANEL
ALT: 14 U/L (ref 0–55)
ANION GAP: 8 meq/L (ref 3–11)
AST: 16 U/L (ref 5–34)
Albumin: 3.7 g/dL (ref 3.5–5.0)
Alkaline Phosphatase: 174 U/L — ABNORMAL HIGH (ref 40–150)
BILIRUBIN TOTAL: 0.33 mg/dL (ref 0.20–1.20)
BUN: 11.5 mg/dL (ref 7.0–26.0)
CHLORIDE: 106 meq/L (ref 98–109)
CO2: 29 meq/L (ref 22–29)
Calcium: 9 mg/dL (ref 8.4–10.4)
Creatinine: 0.9 mg/dL (ref 0.7–1.3)
GLUCOSE: 103 mg/dL (ref 70–140)
POTASSIUM: 3.7 meq/L (ref 3.5–5.1)
SODIUM: 143 meq/L (ref 136–145)
TOTAL PROTEIN: 7.4 g/dL (ref 6.4–8.3)

## 2015-02-28 NOTE — Progress Notes (Signed)
Hematology and Oncology Follow Up Visit  Liang Stolle [id number] Nov 12, 1960 54 y.o. 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 He was diagnosed in 2014 where he had a Gleason score 4+5 = 9 and PSA of 230. He had disease including and to the periaortic lymph nodes measuring 3 cm.   Prior Therapy: He was treated with Lupron therapy at the time of diagnosis and had an excellent response with PSA down to 0.97 back in August 2015.  His PSA did rise to 18.76 in April 2016 and most recently up to 30.85 on 10/19/2014. At that time he was started on Xtandi and tolerated it poorly. He reported muscle cramps and took it for one week only.  He had been off any treatment other than Lupron since that time. His most recent bone scan was done in May 2016 showed a questionable L2 sclerotic lesion although an MRI obtained at that time of the lumbar spine did not show any evidence of bony metastasis.  Current therapy: Zytiga for a total dose of 1000 mg daily started on 02/15/2015. Tivicay and Descovy for his HIV. These were changed in December 2016 to prevent interactions with Zytiga.  Interim History: Mr. Langlinais presents today for a follow-up visit. He is a pleasant gentleman I saw in consultation on 01/30/2015. Since his last visit, he was switched to [place] and Descovy for his HIV by Dr. [name] to make sure there is no interaction between his HIV medication and Zytiga. He started taking Zytiga on 02/15/2015 and have not reported any major side effects. He does report grade 1 fatigue but still able to perform activities of daily living. Has not reported any lower extremity edema, nausea, anorexia or weight loss. He does report lower back pain which is chronic in nature and have not changed. He does not report any bone pain at this time.  He does not  report any headaches, blurry vision, syncope or seizures. Does not report any fevers, chills, sweats or weight loss. Does not report any chest pain, palpitation orthopnea or leg edema. Does not report any cough, hemoptysis or hematemesis. As that report any nausea, vomiting or abdominal pain. Does not report any skeletal complaints of arthralgias or myalgias. Remaining review of systems unremarkable.   Medications: I have reviewed the patient's current medications.  Current Outpatient Prescriptions  Medication Sig Dispense Refill  . abiraterone Acetate (ZYTIGA) 250 MG tablet Take 4 tablets (1,000 mg total) by mouth daily. Take on an empty stomach 1 hour before or 2 hours after a meal 120 tablet 0  . dolutegravir (TIVICAY) 50 MG tablet Take 1 tablet (50 mg total) by mouth daily. 30 tablet 11  . emtricitabine-tenofovir AF (DESCOVY) 200-25 MG tablet Take 1 tablet by mouth daily. 30 tablet 11  . Multiple Vitamin (MULTIVITAMIN WITH MINERALS) TABS tablet Take 1 tablet by mouth daily.     No current facility-administered medications for this visit.     Allergies:  Allergies  Allergen Reactions  . Alka-Seltzer [Aspirin Effervescent] Other (See Comments)    asthma  . Aspirin-Acetaminophen-Caffeine     REACTION: asthma    Past Medical History, Surgical history, Social history, and Family History were reviewed and updated.   Physical Exam: Blood pressure 129/71, pulse 71, temperature 98.7 F (37.1 C), temperature source Oral, resp. rate 18, height 5\' 11"  (1.803 m), weight 231 lb  8 oz (105.008 kg), SpO2 100 %. ECOG: 0 General appearance: alert and cooperative Head: Normocephalic, without obvious abnormality Neck: no adenopathy Lymph nodes: Cervical, supraclavicular, and axillary nodes normal. Heart:regular rate and rhythm, S1, S2 normal, no murmur, click, rub or gallop Lung:chest clear, no wheezing, rales, normal symmetric air entry Abdomin: soft, non-tender, without masses or  organomegaly EXT:no erythema, induration, or nodules   Lab Results: Lab Results  Component Value Date   WBC 4.9 02/28/2015   HGB 11.9* 02/28/2015   HCT 36.0* 02/28/2015   MCV 90.8 02/28/2015   PLT 193 02/28/2015     Chemistry      Component Value Date/Time   NA 143 02/28/2015 1058   NA 142 11/08/2014 1000   K 3.7 02/28/2015 1058   K 4.4 11/08/2014 1000   CL 103 11/08/2014 1000   CO2 29 02/28/2015 1058   CO2 29 11/08/2014 1000   BUN 11.5 02/28/2015 1058   BUN 17 11/08/2014 1000   CREATININE 0.9 02/28/2015 1058   CREATININE 0.69* 11/08/2014 1000   CREATININE 0.97 10/25/2012 1218      Component Value Date/Time   CALCIUM 9.0 02/28/2015 1058   CALCIUM 8.7 11/08/2014 1000   ALKPHOS 174* 02/28/2015 1058   ALKPHOS 115 11/08/2014 1000   AST 16 02/28/2015 1058   AST 19 11/08/2014 1000   ALT 14 02/28/2015 1058   ALT 23 11/08/2014 1000   BILITOT 0.33 02/28/2015 1058   BILITOT 0.2 11/08/2014 1000         Impression and Plan:   54 year old gentleman with the following issues:  1. 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 His initial diagnosis back in June 2014 where he presented with a Gleason score 4+5 = 9 and a PSA of 230. After a period of androgen deprivation and a PSA nadir of less than 1, he started developing a rapid rise in his PSA and have been intolerant to [place].  His most recent PSA on 01/30/2015 was 8.37 and started Zytiga on 02/15/2015. He has tolerated it well without any complications. Plan is to continue with the same dose and schedule without any changes. His PSA is currently pending from today and will let him know with this result in the near future. I anticipated drop in his PSA in the near future if not today definitely with the next check which will be in 4 weeks.   2. Androgen depravation: I have recommended continuing that indefinitely.  3. HIV: He was switched from Atripla toTivicay and Descovy by Dr.  [name]. No interaction with Fabio Asa is anticipated.   4. LFT monitoring: His liver function tests on 02/28/2015 or within normal range and no issues with Zytiga so far.  5. Electrolyte imbalance: His potassium is normal at this time at 3.7 and we'll need to continue to monitor this periodically on Zytiga.  6. Adrenal insufficiency: This will be monitored closely because of the potential problems associated with Zytiga. He has no signs or symptoms of that at this time.  7. Follow-up: Will be in one month to assess any competition related to this medication.   [facility], MD 12/28/201612:03 PM

## 2015-02-28 NOTE — Telephone Encounter (Signed)
Gave pt appt for 04/03/15.

## 2015-03-01 LAB — PSA: PSA: 9.2 ng/mL — AB (ref <=4.00)

## 2015-03-06 ENCOUNTER — Encounter: Payer: Self-pay | Admitting: Pharmacist

## 2015-03-06 NOTE — Progress Notes (Signed)
Oral Chemotherapy Follow-Up Form  Original Start date of oral chemotherapy: _12/15/2016______   Called patient today to follow up regarding patient's oral chemotherapy medication: _Zytiga_  Pt is doing well today. No complaints or side effects other than some mild fatigue. Patient reports no missed doses.   Pt reports __0__ tablets/doses missed in the last week/month.    Pt reports the following side effects: __fatigue_________  Other Issues: __Pt unable to pick up medication from [facility] due to transportation. Next Rx refill please send to [facility]__   Will follow up and call patient again in __1 month__   Thank you,  Montel Clock, PharmD, [facility]

## 2015-03-08 ENCOUNTER — Other Ambulatory Visit: Payer: Self-pay | Admitting: Oncology

## 2015-03-08 MED ORDER — ABIRATERONE ACETATE 250 MG PO TABS: 1000.0000 mg | ORAL_TABLET | Freq: Every day | ORAL | Status: DC

## 2015-03-09 ENCOUNTER — Encounter (HOSPITAL_COMMUNITY): Payer: Self-pay

## 2015-03-09 ENCOUNTER — Telehealth: Payer: Self-pay | Admitting: *Deleted

## 2015-03-09 ENCOUNTER — Emergency Department (HOSPITAL_COMMUNITY): Payer: Medicare Other

## 2015-03-09 ENCOUNTER — Emergency Department (HOSPITAL_COMMUNITY)
Admission: EM | Admit: 2015-03-09 | Discharge: 2015-03-09 | Disposition: A | Discharge status: Home / Self Care | Payer: Medicare Other | Attending: Emergency Medicine | Admitting: Emergency Medicine

## 2015-03-09 DIAGNOSIS — Z8546 Personal history of malignant neoplasm of prostate: Secondary | ICD-10-CM | POA: Diagnosis not present

## 2015-03-09 DIAGNOSIS — B20 Human immunodeficiency virus [HIV] disease: Secondary | ICD-10-CM | POA: Insufficient documentation

## 2015-03-09 DIAGNOSIS — R509 Fever, unspecified: Secondary | ICD-10-CM

## 2015-03-09 DIAGNOSIS — J45909 Unspecified asthma, uncomplicated: Secondary | ICD-10-CM | POA: Diagnosis not present

## 2015-03-09 DIAGNOSIS — R531 Weakness: Secondary | ICD-10-CM | POA: Diagnosis not present

## 2015-03-09 DIAGNOSIS — R5381 Other malaise: Secondary | ICD-10-CM | POA: Diagnosis not present

## 2015-03-09 DIAGNOSIS — Z87891 Personal history of nicotine dependence: Secondary | ICD-10-CM | POA: Insufficient documentation

## 2015-03-09 DIAGNOSIS — Z79899 Other long term (current) drug therapy: Secondary | ICD-10-CM | POA: Insufficient documentation

## 2015-03-09 LAB — URINALYSIS, ROUTINE W REFLEX MICROSCOPIC
Bilirubin Urine: NEGATIVE
Glucose, UA: NEGATIVE mg/dL
HGB URINE DIPSTICK: NEGATIVE
Ketones, ur: NEGATIVE mg/dL
LEUKOCYTES UA: NEGATIVE
NITRITE: NEGATIVE
PROTEIN: 30 mg/dL — AB
SPECIFIC GRAVITY, URINE: 1.025 (ref 1.005–1.030)
pH: 6 (ref 5.0–8.0)

## 2015-03-09 LAB — COMPREHENSIVE METABOLIC PANEL
ALT: 15 U/L — AB (ref 17–63)
AST: 17 U/L (ref 15–41)
Albumin: 3.6 g/dL (ref 3.5–5.0)
Alkaline Phosphatase: 161 U/L — ABNORMAL HIGH (ref 38–126)
Anion gap: 11 (ref 5–15)
BILIRUBIN TOTAL: 0.4 mg/dL (ref 0.3–1.2)
BUN: 11 mg/dL (ref 6–20)
CALCIUM: 8.2 mg/dL — AB (ref 8.9–10.3)
CHLORIDE: 101 mmol/L (ref 101–111)
CO2: 28 mmol/L (ref 22–32)
CREATININE: 0.84 mg/dL (ref 0.61–1.24)
Glucose, Bld: 109 mg/dL — ABNORMAL HIGH (ref 65–99)
Potassium: 3 mmol/L — ABNORMAL LOW (ref 3.5–5.1)
Sodium: 140 mmol/L (ref 135–145)
TOTAL PROTEIN: 7.1 g/dL (ref 6.5–8.1)

## 2015-03-09 LAB — CBC WITH DIFFERENTIAL/PLATELET
BASOS ABS: 0 10*3/uL (ref 0.0–0.1)
BASOS PCT: 0 %
EOS ABS: 0 10*3/uL (ref 0.0–0.7)
EOS PCT: 0 %
HCT: 34.2 % — ABNORMAL LOW (ref 39.0–52.0)
HEMOGLOBIN: 11.3 g/dL — AB (ref 13.0–17.0)
LYMPHS ABS: 1.2 10*3/uL (ref 0.7–4.0)
Lymphocytes Relative: 13 %
MCH: 30 pg (ref 26.0–34.0)
MCHC: 33 g/dL (ref 30.0–36.0)
MCV: 90.7 fL (ref 78.0–100.0)
Monocytes Absolute: 0.6 10*3/uL (ref 0.1–1.0)
Monocytes Relative: 6 %
NEUTROS PCT: 81 %
Neutro Abs: 7.6 10*3/uL (ref 1.7–7.7)
PLATELETS: 170 10*3/uL (ref 150–400)
RBC: 3.77 MIL/uL — AB (ref 4.22–5.81)
RDW: 13.9 % (ref 11.5–15.5)
WBC: 9.4 10*3/uL (ref 4.0–10.5)

## 2015-03-09 LAB — URINE MICROSCOPIC-ADD ON

## 2015-03-09 LAB — I-STAT CG4 LACTIC ACID, ED: LACTIC ACID, VENOUS: 1.17 mmol/L (ref 0.5–2.0)

## 2015-03-09 MED ORDER — POTASSIUM CHLORIDE CRYS ER 20 MEQ PO TBCR
40.0000 meq | EXTENDED_RELEASE_TABLET | Freq: Once | ORAL | Status: AC
  Administered 2015-03-09: 40 meq via ORAL
  Filled 2015-03-09: qty 2

## 2015-03-09 MED ORDER — SODIUM CHLORIDE 0.9 % IV BOLUS (SEPSIS)
1000.0000 mL | Freq: Once | INTRAVENOUS | Status: AC
  Administered 2015-03-09: 1000 mL via INTRAVENOUS

## 2015-03-09 MED ORDER — ACETAMINOPHEN 325 MG PO TABS
650.0000 mg | ORAL_TABLET | Freq: Once | ORAL | Status: AC
  Administered 2015-03-09: 650 mg via ORAL
  Filled 2015-03-09: qty 2

## 2015-03-09 NOTE — ED Notes (Signed)
Per EMS, pt c/o fever starting this morning.  Pt states it was 104 this morning.  Pt also c/o bloody stools x 3 months.  Hemorrhoids being treated.  No n/v.  No cough/congestion.  Vitals: 114/68, hr 90, resp 16

## 2015-03-09 NOTE — ED Notes (Signed)
Attempted cultures without success

## 2015-03-09 NOTE — Telephone Encounter (Signed)
Patient called asking for advice, reports temperature of 102.4.  He is an oncology patient, states he is compliant with all of his medications.  RN advised patient that we do not have a physician here at this time, that he needs to be assessed by a doctor.  RN advised patient to call the oncology group for advice, or that he could go to the ED due to his history.  Patient verbalized agreement. Landis Gandy, RN

## 2015-03-09 NOTE — Discharge Instructions (Signed)
Fever, Adult A fever is an increase in the body's temperature. It is usually defined as a temperature of 100F (38C) or higher. Brief mild or moderate fevers generally have no long-term effects, and they often do not require treatment. Moderate or high fevers may make you feel uncomfortable and can sometimes be a sign of a serious illness or disease. The sweating that may occur with repeated or prolonged fever may also cause dehydration. Fever is confirmed by taking a temperature with a thermometer. A measured temperature can vary with:  Age.  Time of day.  Location of the thermometer:  Mouth (oral).  Rectum (rectal).  Ear (tympanic).  Underarm (axillary).  Forehead (temporal). HOME CARE INSTRUCTIONS Pay attention to any changes in your symptoms. Take these actions to help with your condition:  Take over-the counter and prescription medicines only as told by your health care provider. Follow the dosing instructions carefully.  If you were prescribed an antibiotic medicine, take it as told by your health care provider. Do not stop taking the antibiotic even if you start to feel better.  Rest as needed.  Drink enough fluid to keep your urine clear or pale yellow. This helps to prevent dehydration.  Sponge yourself or bathe with room-temperature water to help reduce your body temperature as needed. Do not use ice water.  Do not overbundle yourself in blankets or heavy clothes. SEEK MEDICAL CARE IF:  You vomit.  You cannot eat or drink without vomiting.  You have diarrhea.  You have pain when you urinate.  Your symptoms do not improve with treatment.  You develop new symptoms.  You develop excessive weakness. SEEK IMMEDIATE MEDICAL CARE IF:  You have shortness of breath or have trouble breathing.  You are dizzy or you faint.  You are disoriented or confused.  You develop signs of dehydration, such as a dry mouth, decreased urination, or paleness.  You develop  severe pain in your abdomen.  You have persistent vomiting or diarrhea.  You develop a skin rash.  Your symptoms suddenly get worse.   This information is not intended to replace advice given to you by your health care provider. Make sure you discuss any questions you have with your health care provider.   Document Released: 08/13/2000 Document Revised: 11/08/2014 Document Reviewed: 04/13/2014 Elsevier Interactive Patient Education 2016 [employer]  

## 2015-03-09 NOTE — ED Provider Notes (Signed)
CSN: [id number]     Arrival date & time 03/09/15  1732 History   First MD Initiated Contact with Patient 03/09/15 1755     Chief Complaint  Patient presents with  . Fever     Patient is a 55 y.o. male presenting with fever. The history is provided by the patient. No language interpreter was used.  Fever  Sylus Chiaro is a 55 y.o. male who presents to the Emergency Department complaining of fever. He reports a fever starting this morning up to 104 at home. He reports generalized weakness and malaise. He denies any sore throat, cough, shortness breath, vomiting, dysuria. He does have some occasional lower abdominal discomfort (for months), none currently. He has a history of prostate cancer and takes medication for this. He also has a history of HIV. He has no known sick contacts. He also reports intermittent hematochezia for the last year.  Past Medical History  Diagnosis Date  . HIV (human immunodeficiency virus infection) ([place])   . Asthma     as a child  . Prostate cancer [facility])    Past Surgical History  Procedure Laterality Date  . Colonoscopy  05/13/2011    Procedure: COLONOSCOPY;  Surgeon: Jerene Bears, MD;  Location: [facility];  Service: Gastroenterology;  Laterality: N/A;  . Prostate biopsy  08/16/2012   Family History  Problem Relation Age of Onset  . Diabetes    . Cancer Neg Hx    Social History  Substance Use Topics  . Smoking status: Former Smoker -- 1.00 packs/day    Types: Cigarettes    Quit date: 03/15/2011  . Smokeless tobacco: Never Used  . Alcohol Use: No     Comment: occ    Review of Systems  Constitutional: Positive for fever.  All other systems reviewed and are negative.     Allergies  Alka-seltzer and Aspirin-acetaminophen-caffeine  Home Medications   Prior to Admission medications   Medication Sig Start Date End Date Taking? Authorizing Provider  abiraterone Acetate (ZYTIGA) 250 MG tablet Take 4 tablets (1,000 mg total) by mouth daily. Take  on an empty stomach 1 hour before or 2 hours after a meal 03/10/15  Yes Wyatt Portela, MD  Ascorbic Acid (VITAMIN C PO) Take 1 tablet by mouth daily.   Yes Historical Provider, MD  dolutegravir (TIVICAY) 50 MG tablet Take 1 tablet (50 mg total) by mouth daily. 02/14/15  Yes Truman Hayward, MD  emtricitabine-tenofovir AF (DESCOVY) 200-25 MG tablet Take 1 tablet by mouth daily. 02/14/15  Yes Truman Hayward, MD   BP 119/55 mmHg  Pulse 76  Temp(Src) 100.2 F (37.9 C) (Oral)  Resp 20  Wt 220 lb (99.791 kg)  SpO2 97% Physical Exam  Constitutional: He is oriented to person, place, and time. He appears well-developed and well-nourished.  HENT:  Head: Normocephalic and atraumatic.  Cardiovascular: Normal rate and regular rhythm.   No murmur heard. Pulmonary/Chest: Effort normal and breath sounds normal. No respiratory distress.  Abdominal: Soft. There is no tenderness. There is no rebound and no guarding.  Musculoskeletal: He exhibits no edema or tenderness.  Neurological: He is alert and oriented to person, place, and time.  Skin: Skin is warm and dry.  Psychiatric: He has a normal mood and affect. His behavior is normal.  Nursing note and vitals reviewed.   ED Course  Procedures (including critical care time) Labs Review Labs Reviewed  COMPREHENSIVE METABOLIC PANEL - Abnormal; Notable for the following:  Potassium 3.0 (*)    Glucose, Bld 109 (*)    Calcium 8.2 (*)    ALT 15 (*)    Alkaline Phosphatase 161 (*)    All other components within normal limits  CBC WITH DIFFERENTIAL/PLATELET - Abnormal; Notable for the following:    RBC 3.77 (*)    Hemoglobin 11.3 (*)    HCT 34.2 (*)    All other components within normal limits  URINALYSIS, ROUTINE W REFLEX MICROSCOPIC (NOT AT [facility]) - Abnormal; Notable for the following:    Protein, ur 30 (*)    All other components within normal limits  URINE MICROSCOPIC-ADD ON - Abnormal; Notable for the following:    Squamous Epithelial  / LPF 0-5 (*)    Bacteria, UA FEW (*)    All other components within normal limits  CULTURE, BLOOD (ROUTINE X 2)  CULTURE, BLOOD (ROUTINE X 2)  URINE CULTURE  I-STAT CG4 LACTIC ACID, ED    Imaging Review Dg Chest 2 View  03/09/2015  CLINICAL DATA:  High fever starting today. Blood in stool. History of asthma. Former smoker. EXAM: CHEST  2 VIEW COMPARISON:  None. FINDINGS: The heart size and mediastinal contours are within normal limits. Both lungs are clear. Lungs are at least mildly hyperexpanded suggesting COPD. The visualized skeletal structures are unremarkable. IMPRESSION: Probable COPD. No evidence of acute cardiopulmonary abnormality. No evidence of pneumonia. Electronically Signed   By: Franki Cabot M.D.   On: 03/09/2015 19:10   I have personally reviewed and evaluated these images and lab results as part of my medical decision-making.   EKG Interpretation None      MDM   Final diagnoses:  Febrile illness, acute    Pt here for evaluation of fever.  He is nontoxic appearing on exam with no evidence of serious bacterial infection at this time.  D/w home care for fever with close outpatient follow up for new/worsening sxs.      Quintella Reichert, MD 03/10/15 [phone]

## 2015-03-11 LAB — URINE CULTURE: Culture: 8000

## 2015-03-12 NOTE — Telephone Encounter (Signed)
Dorie should go to the ED if still having fevers today as again we have closed clinic

## 2015-03-14 ENCOUNTER — Other Ambulatory Visit: Payer: Medicare Other

## 2015-03-15 ENCOUNTER — Telehealth: Payer: Self-pay | Admitting: *Deleted

## 2015-03-15 LAB — CULTURE, BLOOD (ROUTINE X 2)
CULTURE: NO GROWTH
Culture: NO GROWTH

## 2015-03-15 NOTE — Telephone Encounter (Signed)
Patient is requesting a prescription for a shower chair. Please advise. Landis Gandy, RN

## 2015-03-15 NOTE — Telephone Encounter (Signed)
That is fine. I would likely have to put this on a rx pad

## 2015-03-20 ENCOUNTER — Encounter (HOSPITAL_COMMUNITY): Payer: Self-pay

## 2015-03-20 ENCOUNTER — Ambulatory Visit (HOSPITAL_COMMUNITY)
Admission: RE | Admit: 2015-03-20 | Discharge: 2015-03-20 | Disposition: A | Discharge status: Home / Self Care | Payer: Medicare Other | Source: Ambulatory Visit | Attending: Oncology | Admitting: Oncology

## 2015-03-20 DIAGNOSIS — R599 Enlarged lymph nodes, unspecified: Secondary | ICD-10-CM | POA: Insufficient documentation

## 2015-03-20 DIAGNOSIS — K449 Diaphragmatic hernia without obstruction or gangrene: Secondary | ICD-10-CM | POA: Insufficient documentation

## 2015-03-20 DIAGNOSIS — C61 Malignant neoplasm of prostate: Secondary | ICD-10-CM | POA: Insufficient documentation

## 2015-03-20 DIAGNOSIS — C7989 Secondary malignant neoplasm of other specified sites: Secondary | ICD-10-CM | POA: Diagnosis not present

## 2015-03-20 MED ORDER — IOHEXOL 300 MG/ML  SOLN
100.0000 mL | Freq: Once | INTRAMUSCULAR | Status: AC | PRN
  Administered 2015-03-20: 100 mL via INTRAVENOUS

## 2015-03-22 ENCOUNTER — Telehealth: Payer: Self-pay | Admitting: *Deleted

## 2015-03-22 DIAGNOSIS — Z0000 Encounter for general adult medical examination without abnormal findings: Secondary | ICD-10-CM | POA: Diagnosis not present

## 2015-03-22 DIAGNOSIS — R232 Flushing: Secondary | ICD-10-CM | POA: Diagnosis not present

## 2015-03-22 DIAGNOSIS — C61 Malignant neoplasm of prostate: Secondary | ICD-10-CM | POA: Diagnosis not present

## 2015-03-22 DIAGNOSIS — R5383 Other fatigue: Secondary | ICD-10-CM | POA: Diagnosis not present

## 2015-03-22 NOTE — Telephone Encounter (Signed)
Call received in [place] from [facility] with Dr Noah Delaine at [facility].  Per visit today - pt informed them he had no received Zytiga per the mail order pharmacy.  Last dose was 03/16/2015.  Dr Noah Delaine wanted to follow up on above as well as to inquire if Dr Alen Blew wanted pt on prednisone along with the Zytiga.  Dean Carlson is receiving lupron per Dr Doristine Devoid office.  This RN reviewed chart and noted prescription for Zytiga was sent to [facility] per 03/10/2015. Pharmacy contacted per above and was informed by Arti that " it was delayed due to new customer and need to obtain authorization "  " it is ready to be shipped once we contact pt "  This RN informed Arti pt needs ASAP due to out of medication.  Arti placed STAT request for pt contact for mailing.  This RN contacted pt and discussed the above.  Pt is inquiring about " other medication Dr Noah Delaine thought I should be on with the [place] "  This RN informed pt above inquiry will be sent to Dr Alen Blew for appropriate follow up and pt contact.  Return call number for pt given as [phone].

## 2015-03-23 ENCOUNTER — Encounter: Payer: Self-pay | Admitting: Oncology

## 2015-03-23 NOTE — Progress Notes (Signed)
Per [facility] zytiga shipped via [employer]ex 03/22/15

## 2015-03-28 ENCOUNTER — Ambulatory Visit: Payer: Medicare Other | Admitting: Infectious Disease

## 2015-03-30 ENCOUNTER — Telehealth: Payer: Self-pay | Admitting: Oncology

## 2015-03-30 NOTE — Telephone Encounter (Signed)
Patient called to r/s app to after 02/07.  2/22 @ 8:30 lab, 9 MD desk nurse notified.

## 2015-04-02 ENCOUNTER — Telehealth: Payer: Self-pay | Admitting: *Deleted

## 2015-04-02 NOTE — Telephone Encounter (Signed)
Pt discontinued Home Health services through [facility] and [facility] as of Saturday, Jan. 28, 2017.  Pt explained that he felt that these agencies had too much access to his personal health information.  Pt waned Dr. [name] to know this.

## 2015-04-03 ENCOUNTER — Other Ambulatory Visit: Payer: Medicare Other

## 2015-04-03 ENCOUNTER — Ambulatory Visit: Payer: Medicare Other | Admitting: Oncology

## 2015-04-09 ENCOUNTER — Other Ambulatory Visit: Payer: Medicare Other

## 2015-04-09 ENCOUNTER — Other Ambulatory Visit: Payer: Self-pay | Admitting: Oncology

## 2015-04-09 ENCOUNTER — Other Ambulatory Visit (HOSPITAL_COMMUNITY)
Admission: RE | Admit: 2015-04-09 | Discharge: 2015-04-09 | Disposition: A | Discharge status: Home / Self Care | Payer: Medicare Other | Source: Ambulatory Visit | Attending: Infectious Disease | Admitting: Infectious Disease

## 2015-04-09 DIAGNOSIS — Z113 Encounter for screening for infections with a predominantly sexual mode of transmission: Secondary | ICD-10-CM | POA: Insufficient documentation

## 2015-04-09 DIAGNOSIS — B20 Human immunodeficiency virus [HIV] disease: Secondary | ICD-10-CM | POA: Diagnosis not present

## 2015-04-09 DIAGNOSIS — C61 Malignant neoplasm of prostate: Secondary | ICD-10-CM

## 2015-04-09 DIAGNOSIS — E785 Hyperlipidemia, unspecified: Secondary | ICD-10-CM | POA: Diagnosis not present

## 2015-04-09 LAB — LIPID PANEL
Cholesterol: 130 mg/dL (ref 125–200)
HDL: 40 mg/dL (ref >=40)
LDL CALC: 65 mg/dL (ref <130)
Total CHOL/HDL Ratio: 3.3 Ratio (ref <=5.0)
Triglycerides: 124 mg/dL (ref <150)
VLDL: 25 mg/dL (ref <30)

## 2015-04-09 LAB — CBC WITH DIFFERENTIAL/PLATELET
BASOS ABS: 0 10*3/uL (ref 0.0–0.1)
Basophils Relative: 0 % (ref 0–1)
Eosinophils Absolute: 0.2 10*3/uL (ref 0.0–0.7)
Eosinophils Relative: 4 % (ref 0–5)
HCT: 35.5 % — ABNORMAL LOW (ref 39.0–52.0)
HEMOGLOBIN: 11.5 g/dL — AB (ref 13.0–17.0)
LYMPHS ABS: 1.6 10*3/uL (ref 0.7–4.0)
Lymphocytes Relative: 30 % (ref 12–46)
MCH: 28.9 pg (ref 26.0–34.0)
MCHC: 32.4 g/dL (ref 30.0–36.0)
MCV: 89.2 fL (ref 78.0–100.0)
MONOS PCT: 10 % (ref 3–12)
MPV: 10.6 fL (ref 8.6–12.4)
Monocytes Absolute: 0.5 10*3/uL (ref 0.1–1.0)
NEUTROS ABS: 2.9 10*3/uL (ref 1.7–7.7)
NEUTROS PCT: 56 % (ref 43–77)
PLATELETS: 219 10*3/uL (ref 150–400)
RBC: 3.98 MIL/uL — ABNORMAL LOW (ref 4.22–5.81)
RDW: 14.8 % (ref 11.5–15.5)
WBC: 5.2 10*3/uL (ref 4.0–10.5)

## 2015-04-09 LAB — COMPLETE METABOLIC PANEL WITH GFR
ALBUMIN: 3.7 g/dL (ref 3.6–5.1)
ALK PHOS: 147 U/L — AB (ref 40–115)
ALT: 14 U/L (ref 9–46)
AST: 18 U/L (ref 10–35)
BILIRUBIN TOTAL: 0.2 mg/dL (ref 0.2–1.2)
BUN: 8 mg/dL (ref 7–25)
CO2: 30 mmol/L (ref 20–31)
CREATININE: 0.78 mg/dL (ref 0.70–1.33)
Calcium: 8.6 mg/dL (ref 8.6–10.3)
Chloride: 104 mmol/L (ref 98–110)
GLUCOSE: 132 mg/dL — AB (ref 65–99)
Potassium: 3.6 mmol/L (ref 3.5–5.3)
SODIUM: 142 mmol/L (ref 135–146)
TOTAL PROTEIN: 6.6 g/dL (ref 6.1–8.1)

## 2015-04-09 MED ORDER — ABIRATERONE ACETATE 250 MG PO TABS: 1000.0000 mg | ORAL_TABLET | Freq: Every day | ORAL | Status: DC

## 2015-04-10 ENCOUNTER — Other Ambulatory Visit: Payer: Self-pay | Admitting: Oncology

## 2015-04-10 ENCOUNTER — Telehealth: Payer: Self-pay | Admitting: Pharmacist

## 2015-04-10 LAB — T-HELPER CELL (CD4) - (RCID CLINIC ONLY)
CD4 % Helper T Cell: 29 % — ABNORMAL LOW (ref 33–55)
CD4 T Cell Abs: 420 /uL (ref 400–2700)

## 2015-04-10 LAB — HIV-1 RNA QUANT-NO REFLEX-BLD
HIV 1 RNA Quant: <20 copies/mL (ref <20)
HIV-1 RNA Quant, Log: <1.3 Log copies/mL (ref <1.30)

## 2015-04-10 LAB — MICROALBUMIN / CREATININE URINE RATIO
Creatinine, Urine: 159 mg/dL (ref 20–370)
Microalb Creat Ratio: 8 mcg/mg creat (ref <30)
Microalb, Ur: 1.2 mg/dL

## 2015-04-10 LAB — URINE CYTOLOGY ANCILLARY ONLY
Chlamydia: NEGATIVE
Neisseria Gonorrhea: NEGATIVE

## 2015-04-10 LAB — RPR

## 2015-04-10 NOTE — Telephone Encounter (Signed)
04/10/15: Attempted to reach patient for follow up on oral medication: Zytiga. No answer. Left VM for patient to call back with any questions or issues. It appears patient had issues receiving Zytiga from [facility] back in January and he was without medication for ~4-5 days  Thank you,  Montel Clock, PharmD, [facility] [phone]

## 2015-04-13 LAB — HLA B*5701: HLA-B 5701 W/RFLX HLA-B HIGH: NEGATIVE

## 2015-04-25 ENCOUNTER — Telehealth: Payer: Self-pay | Admitting: Oncology

## 2015-04-25 ENCOUNTER — Ambulatory Visit (HOSPITAL_BASED_OUTPATIENT_CLINIC_OR_DEPARTMENT_OTHER): Payer: Medicare Other | Admitting: Oncology

## 2015-04-25 ENCOUNTER — Other Ambulatory Visit (HOSPITAL_BASED_OUTPATIENT_CLINIC_OR_DEPARTMENT_OTHER): Payer: Medicare Other

## 2015-04-25 VITALS — BP 141/77 | HR 81 | Temp 98.6°F | Resp 18 | Ht 71.0 in | Wt 224.8 lb

## 2015-04-25 DIAGNOSIS — B20 Human immunodeficiency virus [HIV] disease: Secondary | ICD-10-CM | POA: Diagnosis not present

## 2015-04-25 DIAGNOSIS — C774 Secondary and unspecified malignant neoplasm of inguinal and lower limb lymph nodes: Secondary | ICD-10-CM | POA: Diagnosis not present

## 2015-04-25 DIAGNOSIS — E2740 Unspecified adrenocortical insufficiency: Secondary | ICD-10-CM | POA: Diagnosis not present

## 2015-04-25 DIAGNOSIS — C61 Malignant neoplasm of prostate: Secondary | ICD-10-CM

## 2015-04-25 LAB — CBC WITH DIFFERENTIAL/PLATELET
BASO%: 0.4 % (ref 0.0–2.0)
Basophils Absolute: 0 10*3/uL (ref 0.0–0.1)
EOS ABS: 0.1 10*3/uL (ref 0.0–0.5)
EOS%: 2.7 % (ref 0.0–7.0)
HEMATOCRIT: 38 % — AB (ref 38.4–49.9)
HGB: 12.7 g/dL — ABNORMAL LOW (ref 13.0–17.1)
LYMPH#: 1.6 10*3/uL (ref 0.9–3.3)
LYMPH%: 30.8 % (ref 14.0–49.0)
MCH: 29.3 pg (ref 27.2–33.4)
MCHC: 33.4 g/dL (ref 32.0–36.0)
MCV: 87.8 fL (ref 79.3–98.0)
MONO#: 0.7 10*3/uL (ref 0.1–0.9)
MONO%: 13.7 % (ref 0.0–14.0)
NEUT%: 52.4 % (ref 39.0–75.0)
NEUTROS ABS: 2.7 10*3/uL (ref 1.5–6.5)
PLATELETS: 236 10*3/uL (ref 140–400)
RBC: 4.33 10*6/uL (ref 4.20–5.82)
RDW: 13.7 % (ref 11.0–14.6)
WBC: 5.2 10*3/uL (ref 4.0–10.3)

## 2015-04-25 LAB — COMPREHENSIVE METABOLIC PANEL
ALBUMIN: 4 g/dL (ref 3.5–5.0)
ALK PHOS: 177 U/L — AB (ref 40–150)
ALT: 15 U/L (ref 0–55)
ANION GAP: 10 meq/L (ref 3–11)
AST: 15 U/L (ref 5–34)
BILIRUBIN TOTAL: 0.56 mg/dL (ref 0.20–1.20)
BUN: 11.5 mg/dL (ref 7.0–26.0)
CALCIUM: 9.4 mg/dL (ref 8.4–10.4)
CO2: 28 mEq/L (ref 22–29)
CREATININE: 0.9 mg/dL (ref 0.7–1.3)
Chloride: 104 mEq/L (ref 98–109)
Glucose: 107 mg/dl (ref 70–140)
Potassium: 3.5 mEq/L (ref 3.5–5.1)
Sodium: 142 mEq/L (ref 136–145)
TOTAL PROTEIN: 8 g/dL (ref 6.4–8.3)

## 2015-04-25 NOTE — Progress Notes (Signed)
Hematology and Oncology Follow Up Visit  Dean Carlson [id number] [birth year] 55 y.o. 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 He was diagnosed in 2014 where he had a Gleason score 4+5 = 9 and PSA of 230. He had disease including and to the periaortic lymph nodes measuring 3 cm.   Prior Therapy: He was treated with Lupron therapy at the time of diagnosis and had an excellent response with PSA down to 0.97 back in August 2015.  His PSA did rise to 18.76 in April 2016 and most recently up to 30.85 on 10/19/2014. At that time he was started on Xtandi and tolerated it poorly. He reported muscle cramps and took it for one week only.  He had been off any treatment other than Lupron since that time. His most recent bone scan was done in May 2016 showed a questionable L2 sclerotic lesion although an MRI obtained at that time of the lumbar spine did not show any evidence of bony metastasis.  Current therapy: Zytiga for a total dose of 1000 mg daily started on 02/15/2015. Tivicay and Descovy for his HIV. These were changed in December 2016 to prevent interactions with Zytiga.  Interim History: Dean Carlson presents today for a follow-up visit. Since his last visit, he reports no major changes in his health. He remains on Zytiga since 04/17/2014 and have not reported any major side effects. He does report grade 1 fatigue but still able to perform activities of daily living. Has not reported any lower extremity edema, nausea, anorexia or weight loss.   He was seen in the emergency department on 03/09/2015 further evaluation of fever. His evaluation did not reveal any clear-cut etiology and was discharged home without hospitalization. This episode has subsided and have not had any recurrence of any symptoms.  He does not report any headaches, blurry  vision, syncope or seizures. Does not report any fevers, chills, sweats or weight loss. Does not report any chest pain, palpitation orthopnea or leg edema. Does not report any cough, hemoptysis or hematemesis. As that report any nausea, vomiting or abdominal pain. Does not report any skeletal complaints of arthralgias or myalgias. Remaining review of systems unremarkable.   Medications: I have reviewed the patient's current medications.  Current Outpatient Prescriptions  Medication Sig Dispense Refill  . abiraterone Acetate (ZYTIGA) 250 MG tablet Take 4 tablets (1,000 mg total) by mouth daily. Take on an empty stomach 1 hour before or 2 hours after a meal 120 tablet 0  . Ascorbic Acid (VITAMIN C PO) Take 1 tablet by mouth daily.    . dolutegravir (TIVICAY) 50 MG tablet Take 1 tablet (50 mg total) by mouth daily. 30 tablet 11  . emtricitabine-tenofovir AF (DESCOVY) 200-25 MG tablet Take 1 tablet by mouth daily. 30 tablet 11   No current facility-administered medications for this visit.     Allergies:  Allergies  Allergen Reactions  . Alka-Seltzer [Aspirin Effervescent] Other (See Comments)    asthma  . Excedrin Extra Strength [Aspirin-Acetaminophen-Caffeine] Other (See Comments)    REACTION: asthma    Past Medical History, Surgical history, Social history, and Family History were reviewed and updated.   Physical Exam: Blood pressure 141/77, pulse 81, temperature 98.6 F (37 C), temperature source Oral, resp. rate 18, height 5\' 11"  (1.803 m), weight 224 lb 12.8 oz (101.969 kg), SpO2 100 %. ECOG: 0 General  appearance: alert and cooperative appeared without distress. Head: Normocephalic, without obvious abnormality no oral ulcers or lesions. Neck: no adenopathy no thyroid masses. Lymph nodes: Cervical, supraclavicular, and axillary nodes normal. Heart:regular rate and rhythm, S1, S2 normal, no murmur, click, rub or gallop Lung:chest clear, no wheezing, rales, normal symmetric air  entry Abdomin: soft, non-tender, without masses or organomegaly no shifting dullness or ascites. EXT:no erythema, induration, or nodules   Lab Results: Lab Results  Component Value Date   WBC 5.2 04/25/2015   HGB 12.7* 04/25/2015   HCT 38.0* 04/25/2015   MCV 87.8 04/25/2015   PLT 236 04/25/2015     Chemistry      Component Value Date/Time   NA 142 04/09/2015 1227   NA 143 02/28/2015 1058   K 3.6 04/09/2015 1227   K 3.7 02/28/2015 1058   CL 104 04/09/2015 1227   CO2 30 04/09/2015 1227   CO2 29 02/28/2015 1058   BUN 8 04/09/2015 1227   BUN 11.5 02/28/2015 1058   CREATININE 0.78 04/09/2015 1227   CREATININE 0.84 03/09/2015 1802   CREATININE 0.9 02/28/2015 1058      Component Value Date/Time   CALCIUM 8.6 04/09/2015 1227   CALCIUM 9.0 02/28/2015 1058   ALKPHOS 147* 04/09/2015 1227   ALKPHOS 174* 02/28/2015 1058   AST 18 04/09/2015 1227   AST 16 02/28/2015 1058   ALT 14 04/09/2015 1227   ALT 14 02/28/2015 1058   BILITOT 0.2 04/09/2015 1227   BILITOT 0.33 02/28/2015 1058     EXAM: CT ABDOMEN AND PELVIS WITH CONTRAST  TECHNIQUE: Multidetector CT imaging of the abdomen and pelvis was performed using the standard protocol following bolus administration of intravenous contrast.  CONTRAST: 144mL OMNIPAQUE IOHEXOL 300 MG/ML SOLN  COMPARISON: 03/20/2011.  FINDINGS: Lower chest: Unremarkable.  Hepatobiliary: No focal abnormality within the liver parenchyma. There is no evidence for gallstones, gallbladder wall thickening, or pericholecystic fluid. No intrahepatic or extrahepatic biliary dilation.  Pancreas: No focal mass lesion. No dilatation of the main duct. No intraparenchymal cyst. No peripancreatic edema.  Spleen: No splenomegaly. No focal mass lesion.  Adrenals/Urinary Tract: No adrenal nodule or mass. Kidneys are normal in appearance bilaterally. No evidence for hydroureter. Bladder wall appears irregular and slightly thickened despite  the degree of underdistention.  Stomach/Bowel: Small hiatal hernia noted. Stomach otherwise unremarkable. Duodenum is normally positioned as is the ligament of Treitz. No small bowel wall thickening. No small bowel dilatation. The terminal ileum is normal. The appendix is normal. No gross colonic mass. No colonic wall thickening. No substantial diverticular change.  Vascular/Lymphatic: No abdominal aortic aneurysm. No abdominal atherosclerotic calcification. 1.7 cm short axis retrocaval lymph node is seen on image 26 series 2. 9 mm short axis left para-aortic lymph node is visible on image 27. No evidence for pelvic sidewall lymphadenopathy. 9 mm short axis right external iliac lymph node is seen on image 65. 1.8 cm short axis right inguinal lymph node is seen on image 85.  Reproductive: Prostate gland has somewhat ill-defined margins. There is some minimal stranding in the soft tissues of the pelvic floor.  Other: No intraperitoneal free fluid.  Musculoskeletal: 1.9 cm sclerotic lesion is identified in the L2 vertebral body. This lesion was not present on the previous study.  IMPRESSION: 1. Enlarged retrocaval lymph node, suspicious for metastatic disease in this patient with a history of prostate cancer. 2. New 1.9 cm sclerotic lesion in the L2 vertebral body. Bony metastatic involvement is a concern at this level.  3. Circumferential bladder wall thickening with ill-defined margins of the bladder wall. These changes may be treatment related, but infection could produce this CT appearance.     Impression and Plan:   55 year old gentleman with the following issues:  1. 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 His initial diagnosis back in June 2014 where he presented with a Gleason score 4+5 = 9 and a PSA of 230. After a period of androgen deprivation and a PSA nadir of less than 1, he started developing a rapid rise in  his PSA and have been intolerant to [place].  CT scan on January 2017 was reviewed which showed pelvic adenopathy and potential 1.9 cm sclerotic lesion at the L2 vertebral body.  His PSA was up to 9.20 and was started on Zytiga in December 2016. He tolerated this medication well without any complications. His PSA is currently pending from today and the plan is to continue with the same dose and schedule as long as his PSA is responding. Difference hours therapy can be used in the future if he does not respond to this treatment. Encouraging that he did not develop any major side effects associated with this medication.   2. Androgen depravation: I have recommended continuing that indefinitely.  3. HIV: He was switched from Atripla toTivicay and Descovy by Dr. Tommy Carlson. No complications associated with these medication noted at this time.  4. LFT monitoring: His liver function tests on 04/09/2015 all within normal range.  5. Electrolyte imbalance: His potassium  on 04/09/2015 was normal. Rest of his electrolytes all within normal range.  6. Adrenal insufficiency: This will be monitored closely because of the potential problems associated with Zytiga. He has no signs or symptoms of that at this time.  7. Follow-up: Will be in 4-6 weeks for evaluation.   [facility], MD 2/22/20179:23 AM

## 2015-04-25 NOTE — Telephone Encounter (Signed)
Pt confirmed labs/ov per 02/22 POF, pt declined AVS and Calendar...Marland KitchenMarland KitchenMarland Kitchen KJ

## 2015-04-26 ENCOUNTER — Telehealth: Payer: Self-pay | Admitting: *Deleted

## 2015-04-26 LAB — PSA: PROSTATE SPECIFIC AG, SERUM: 6 ng/mL — AB (ref 0.0–4.0)

## 2015-04-26 LAB — PSA (PARALLEL TESTING): PSA: 6.14 ng/mL — AB (ref <=4.00)

## 2015-04-26 NOTE — Telephone Encounter (Signed)
As noted below by Dr. Shadad, I informed patient of his PSA level. Patient verbalized understanding. 

## 2015-04-26 NOTE — Telephone Encounter (Signed)
-----   Message from Wyatt Portela, MD sent at 04/26/2015 10:40 AM EST ----- Please let him know his PSA is down.

## 2015-05-01 ENCOUNTER — Telehealth: Payer: Self-pay

## 2015-05-01 NOTE — Telephone Encounter (Signed)
..  Oral Chemotherapy Follow-Up Form  Original Start date of oral chemotherapy:  02/15/2015  Called patient today to follow up regarding patient's oral chemotherapy medication: Zytiga  Pt is doing well today except for complaints of fatigue and dizziness.  States he is very tired after going to retrieve his mail.  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  He feels better when he plays but offers up that he is unable to play for the length of time he previously was able to play prior to the start of Zytiga.  I offered him to contact our social worker on his next visit to discuss any support groups etc that may be available to him to provide some outside social interactions that my improve his stamina and get him out of the house and around others with the similar situations.  He was appreciative of this and will consider speaking with the social worker his next office visit.  Pt reports __0__ tablets/doses missed in the last week/month.  Missed dose(s) attributed to: ____________  Pt reports the following side effects: __fatigue and dizziness_____  Other Issues: _sadness over his ex-wife's death but does not want any medication intervention________   Will follow up and call patient again in 1 month.   Thank you,  Henreitta Leber, PharmD [facility]

## 2015-05-08 ENCOUNTER — Encounter: Payer: Self-pay | Admitting: *Deleted

## 2015-05-08 NOTE — Progress Notes (Signed)
[place] Work  Clinical Social Work was referred by Henreitta Leber, [facility], for assessment of psychosocial needs.  Clinical Social Worker attempted to contact patient by phone.  CSW left voicemail requesting patient return call.     Polo Riley, MSW, LCSW, OSW-C Clinical Social Worker [facility] [phone]

## 2015-05-09 ENCOUNTER — Ambulatory Visit: Payer: Medicare Other | Admitting: *Deleted

## 2015-05-09 ENCOUNTER — Ambulatory Visit (INDEPENDENT_AMBULATORY_CARE_PROVIDER_SITE_OTHER): Payer: Medicare Other | Admitting: Infectious Disease

## 2015-05-09 ENCOUNTER — Encounter: Payer: Self-pay | Admitting: Infectious Disease

## 2015-05-09 VITALS — Ht 71.0 in | Wt 237.0 lb

## 2015-05-09 DIAGNOSIS — F4321 Adjustment disorder with depressed mood: Secondary | ICD-10-CM

## 2015-05-09 DIAGNOSIS — B20 Human immunodeficiency virus [HIV] disease: Secondary | ICD-10-CM

## 2015-05-09 DIAGNOSIS — E291 Testicular hypofunction: Secondary | ICD-10-CM

## 2015-05-09 DIAGNOSIS — G4700 Insomnia, unspecified: Secondary | ICD-10-CM | POA: Insufficient documentation

## 2015-05-09 DIAGNOSIS — F0630 Mood disorder due to known physiological condition, unspecified: Secondary | ICD-10-CM

## 2015-05-09 DIAGNOSIS — C61 Malignant neoplasm of prostate: Secondary | ICD-10-CM | POA: Diagnosis not present

## 2015-05-09 DIAGNOSIS — F4323 Adjustment disorder with mixed anxiety and depressed mood: Secondary | ICD-10-CM | POA: Diagnosis not present

## 2015-05-09 DIAGNOSIS — R42 Dizziness and giddiness: Secondary | ICD-10-CM | POA: Insufficient documentation

## 2015-05-09 DIAGNOSIS — R635 Abnormal weight gain: Secondary | ICD-10-CM

## 2015-05-09 HISTORY — DX: Insomnia, unspecified: G47.00

## 2015-05-09 HISTORY — DX: Adjustment disorder with mixed anxiety and depressed mood: F43.23

## 2015-05-09 HISTORY — DX: Adjustment disorder with depressed mood: F43.21

## 2015-05-09 HISTORY — DX: Abnormal weight gain: R63.5

## 2015-05-09 HISTORY — DX: Dizziness and giddiness: R42

## 2015-05-09 NOTE — BH Specialist Note (Signed)
Counselor met with Dean Carlson today in the exam room due to indication that he was having a hard time going to sleep.  Patient was oriented times four with good affect and dress.  Patient was alert and talkative.  Counselor inquired with patient why he felt he was having sleep issues.  Patient shred that all the medication he was on was really affecting him physically and causing sleep disturbances.  Counselor provided support and encouragement for patient accordingly.  Counselor recommended that patient meet for counseling to help process what all is going on in his life right now.  Patient agreed that he thinks that may be a good idea since he is alone and has little social support.  Counselor gave patient a business card and encouraged him to make an appointment.  Patient agreed he would.   Rolena Infante, MA [facility]

## 2015-05-09 NOTE — Progress Notes (Signed)
Chief complaint: dizziness, grief at loss of his former girlfriend, difficulty sleeping  Subjective:    Patient ID: Dean Carlson, male    DOB: [birth year], 55 y.o.   MRN: [id number]  HPI   Dean Carlson is 55 year old man with HIV, prostate cancer that was diagnosed while he was on testosterone. He has been followed closely by urology and now by Oncology with Dr. Barbaraann Faster who has  Shane Crutch. We had checked drug-drug interactions and there was a significant one with EFV with Zytiga so we brought him in to change him a regimen that would not interact with Zytiga and changed him to [place] and [place]   Since we last saw him he has maintained perfect virological suppression.  He says that since he started the [facility] he has experienced dizziness that comes and, goes, weight gain. He also has noted that he is up at night having trouble going to sleep thinking about the future, when he might pass away and other issues that he says normally do not bother him. He attributes much of these ssx to his meds.  He has experienced loss of former girlfriend from many years back (Mother of his son--who was given up for adoption at the time). Olman had apparently reached out to her due to concerns about his own mortality. Then roughly a month later she died of a cerebral aneurysm. Apparently they shared love for music.   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   He says he frequently cries at night but he considers this process normal. He is adamantly that he does not want more medicines and that he deals with these issues through God and his engagement in his purpose in life which includes composing, recording music and now making art.      Past Medical History  Diagnosis Date  . HIV (human immunodeficiency virus infection) ([place])   . Asthma     as a child  . Prostate cancer ([place])   . Adjustment disorder with mixed anxiety and depressed mood  05/09/2015    Past Surgical History  Procedure Laterality Date  . Colonoscopy  05/13/2011    Procedure: COLONOSCOPY;  Surgeon: Jerene Bears, MD;  Location: [facility];  Service: Gastroenterology;  Laterality: N/A;  . Prostate biopsy  08/16/2012    Family History  Problem Relation Age of Onset  . Diabetes    . Cancer Neg Hx       Social History   Social History  . Marital Status: Legally Separated    Spouse Name: N/A  . Number of Children: N/A  . Years of Education: N/A   Occupational History  . permanant disability    Social History Main Topics  . Smoking status: Former Smoker -- 1.00 packs/day    Types: Cigarettes    Quit date: 03/15/2011  . Smokeless tobacco: Never Used  . Alcohol Use: No     Comment: occ  . Drug Use: No  . Sexual Activity: Not Currently   Other Topics Concern  . Not on file   Social History Narrative    Allergies  Allergen Reactions  . Alka-Seltzer [Aspirin Effervescent] Other (See Comments)    asthma  . Excedrin Extra Strength [Aspirin-Acetaminophen-Caffeine] Other (See Comments)    REACTION: asthma     Current outpatient prescriptions:  .  abiraterone Acetate (ZYTIGA) 250 MG tablet, Take 4 tablets (1,000 mg total) by mouth daily. Take on an empty stomach 1  hour before or 2 hours after a meal, Disp: 120 tablet, Rfl: 0 .  Ascorbic Acid (VITAMIN C PO), Take 1 tablet by mouth daily., Disp: , Rfl:  .  dolutegravir (TIVICAY) 50 MG tablet, Take 1 tablet (50 mg total) by mouth daily., Disp: 30 tablet, Rfl: 11 .  emtricitabine-tenofovir AF (DESCOVY) 200-25 MG tablet, Take 1 tablet by mouth daily., Disp: 30 tablet, Rfl: 11    Review of Systems  Constitutional: Positive for fatigue and unexpected weight change. Negative for fever, chills, diaphoresis, activity change and appetite change.  HENT: Negative for congestion, rhinorrhea, sinus pressure, sneezing, sore throat and trouble swallowing.   Eyes: Negative for photophobia and visual  disturbance.  Respiratory: Negative for cough, chest tightness, shortness of breath, wheezing and stridor.   Cardiovascular: Negative for chest pain, palpitations and leg swelling.  Gastrointestinal: Negative for nausea, vomiting, abdominal pain, diarrhea, constipation, blood in stool, abdominal distention and anal bleeding.  Genitourinary: Negative for dysuria, hematuria, flank pain and difficulty urinating.  Musculoskeletal: Negative for myalgias, back pain, joint swelling, arthralgias and gait problem.  Skin: Negative for color change, pallor, rash and wound.  Neurological: Positive for dizziness. Negative for tremors, weakness and light-headedness.  Hematological: Negative for adenopathy. Does not bruise/bleed easily.  Psychiatric/Behavioral: Positive for sleep disturbance. Negative for behavioral problems, confusion, dysphoric mood, decreased concentration and agitation.       Objective:   Physical Exam  Constitutional: He is oriented to person, place, and time. He appears well-developed and well-nourished.  HENT:  Head: Normocephalic and atraumatic.  Eyes: Conjunctivae and EOM are normal.  Neck: Normal range of motion. Neck supple.  Cardiovascular: Normal rate and regular rhythm.   Pulmonary/Chest: Effort normal. No respiratory distress. He has no wheezes.  Abdominal: Soft. He exhibits no distension.  Musculoskeletal: Normal range of motion. He exhibits no edema or tenderness.  Neurological: He is alert and oriented to person, place, and time.  Skin: Skin is warm and dry. No rash noted. No erythema. No pallor.  Psychiatric: He has a normal mood and affect. His behavior is normal. Judgment and thought content normal.          Assessment & Plan:   HIV: IN case SOME of his Insomnia ssx are due to DTG I am asking Jerian to take his  Tivicay and Descovy in the am  Fatigue, dizziness: not sure if this may be due to his new medication from Dr. Barbaraann Faster. Will defer to him.  Prostate  cancer: to followup with Dr. Barbaraann Faster. PSA is on way down slightly. I think Juergen would benefit in knowing his long term prognosis.   Insomnia: see above will change DTG and Descovy to AM dose. I suspect much of this also being driven by his concerns about his own mortality and loss of his girlfriend as well as his mother in recent years + possibly side effect of other meds--which is what he contends  Grief over loss of his girlfriend and mother: counslled him extensively and had him meet with Leveda Anna. I made him aware of the HIV Peer support group and it sounds as if the cancer center is trying to similarly engage him in one of their Peer Support Groups.  I spent greater than 40 minutes with the patient including greater than 50% of time in face to face counsel of the patient re his new ARV regimen, his prostate cancer his grief, anxiety, depression, insomnia, fatigue, dizziness  and in coordination of his care.

## 2015-05-21 ENCOUNTER — Telehealth: Payer: Self-pay | Admitting: *Deleted

## 2015-05-21 DIAGNOSIS — M25579 Pain in unspecified ankle and joints of unspecified foot: Secondary | ICD-10-CM
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
## 2015-05-21 NOTE — Telephone Encounter (Signed)
Refer to [facility]

## 2015-05-22 NOTE — Telephone Encounter (Signed)
Spoke with the pt.  Will call him with the appt information.

## 2015-05-22 NOTE — Addendum Note (Signed)
Addended by: Lorne Skeens D on: 05/22/2015 12:11 PM   Modules accepted: Orders

## 2015-06-06 ENCOUNTER — Telehealth: Payer: Self-pay | Admitting: Oncology

## 2015-06-06 ENCOUNTER — Ambulatory Visit: Payer: Medicare Other | Admitting: Radiation Oncology

## 2015-06-06 ENCOUNTER — Ambulatory Visit: Payer: Medicare Other | Admitting: Oncology

## 2015-06-06 ENCOUNTER — Other Ambulatory Visit: Payer: Medicare Other

## 2015-06-06 NOTE — Telephone Encounter (Signed)
returned call adn s.w. pt and r/s appt per pt request....pt ok adn aware of new d.t

## 2015-06-14 ENCOUNTER — Ambulatory Visit: Payer: Medicare Other | Admitting: *Deleted

## 2015-06-18 ENCOUNTER — Encounter: Payer: Self-pay | Admitting: Podiatry

## 2015-06-18 ENCOUNTER — Ambulatory Visit: Payer: Medicare Other

## 2015-06-18 ENCOUNTER — Encounter: Payer: Medicare Other | Admitting: Podiatry

## 2015-06-18 DIAGNOSIS — R52 Pain, unspecified: Secondary | ICD-10-CM

## 2015-06-18 NOTE — Progress Notes (Signed)
   Subjective:    Patient ID: Dean Carlson, male    DOB: [birth year], 55 y.o.   MRN: [id number]  HPI i think i have a splinter in my right foot    Review of Systems  All other systems reviewed and are negative.      Objective:   Physical Exam        Assessment & Plan:

## 2015-06-19 ENCOUNTER — Encounter: Payer: Self-pay | Admitting: Oncology

## 2015-06-19 NOTE — Progress Notes (Signed)
Faxes done 02/23/15  02/02/15 I sent to medical records

## 2015-06-25 ENCOUNTER — Ambulatory Visit: Payer: Medicare Other | Admitting: *Deleted

## 2015-07-04 ENCOUNTER — Encounter: Payer: Self-pay | Admitting: Oncology

## 2015-07-04 NOTE — Progress Notes (Signed)
Fax from 02/06/15 I sent to medical records

## 2015-07-09 ENCOUNTER — Other Ambulatory Visit: Payer: Self-pay | Admitting: Oncology

## 2015-07-10 ENCOUNTER — Other Ambulatory Visit: Payer: Self-pay | Admitting: *Deleted

## 2015-07-10 DIAGNOSIS — C61 Malignant neoplasm of prostate: Secondary | ICD-10-CM

## 2015-07-10 MED ORDER — ABIRATERONE ACETATE 250 MG PO TABS: ORAL_TABLET | ORAL | Status: DC

## 2015-07-12 ENCOUNTER — Ambulatory Visit (HOSPITAL_BASED_OUTPATIENT_CLINIC_OR_DEPARTMENT_OTHER): Payer: Medicare Other | Admitting: Oncology

## 2015-07-12 ENCOUNTER — Other Ambulatory Visit (HOSPITAL_BASED_OUTPATIENT_CLINIC_OR_DEPARTMENT_OTHER): Payer: Medicare Other

## 2015-07-12 ENCOUNTER — Telehealth: Payer: Self-pay | Admitting: Oncology

## 2015-07-12 ENCOUNTER — Encounter: Payer: Self-pay | Admitting: Oncology

## 2015-07-12 VITALS — BP 129/86 | HR 83 | Temp 98.5°F | Resp 19 | Ht 71.0 in | Wt 221.4 lb

## 2015-07-12 DIAGNOSIS — C61 Malignant neoplasm of prostate: Secondary | ICD-10-CM

## 2015-07-12 DIAGNOSIS — E291 Testicular hypofunction: Secondary | ICD-10-CM | POA: Diagnosis not present

## 2015-07-12 DIAGNOSIS — M859 Disorder of bone density and structure, unspecified: Secondary | ICD-10-CM

## 2015-07-12 LAB — COMPREHENSIVE METABOLIC PANEL
ALBUMIN: 4.1 g/dL (ref 3.5–5.0)
ALT: 29 U/L (ref 0–55)
AST: 16 U/L (ref 5–34)
Alkaline Phosphatase: 200 U/L — ABNORMAL HIGH (ref 40–150)
Anion Gap: 8 mEq/L (ref 3–11)
BUN: 10.5 mg/dL (ref 7.0–26.0)
CALCIUM: 9.4 mg/dL (ref 8.4–10.4)
CHLORIDE: 105 meq/L (ref 98–109)
CO2: 29 mEq/L (ref 22–29)
Creatinine: 0.9 mg/dL (ref 0.7–1.3)
Glucose: 102 mg/dl (ref 70–140)
POTASSIUM: 3.9 meq/L (ref 3.5–5.1)
SODIUM: 142 meq/L (ref 136–145)
Total Bilirubin: 0.39 mg/dL (ref 0.20–1.20)
Total Protein: 8 g/dL (ref 6.4–8.3)

## 2015-07-12 LAB — CBC WITH DIFFERENTIAL/PLATELET
BASO%: 0.2 % (ref 0.0–2.0)
BASOS ABS: 0 10*3/uL (ref 0.0–0.1)
EOS ABS: 0.1 10*3/uL (ref 0.0–0.5)
EOS%: 2.3 % (ref 0.0–7.0)
HEMATOCRIT: 39 % (ref 38.4–49.9)
HEMOGLOBIN: 13.1 g/dL (ref 13.0–17.1)
LYMPH%: 41.3 % (ref 14.0–49.0)
MCH: 28.9 pg (ref 27.2–33.4)
MCHC: 33.6 g/dL (ref 32.0–36.0)
MCV: 85.9 fL (ref 79.3–98.0)
MONO#: 0.6 10*3/uL (ref 0.1–0.9)
MONO%: 11.7 % (ref 0.0–14.0)
NEUT#: 2.1 10*3/uL (ref 1.5–6.5)
NEUT%: 44.5 % (ref 39.0–75.0)
Platelets: 198 10*3/uL (ref 140–400)
RBC: 4.54 10*6/uL (ref 4.20–5.82)
RDW: 14.2 % (ref 11.0–14.6)
WBC: 4.8 10*3/uL (ref 4.0–10.3)
lymph#: 2 10*3/uL (ref 0.9–3.3)

## 2015-07-12 NOTE — Progress Notes (Signed)
Hematology and Oncology Follow Up Visit  Dean Carlson [id number] 1960-04-16 55 y.o. 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 He was diagnosed in 2014 where he had a Gleason score 4+5 = 9 and PSA of 230. He had disease including and to the periaortic lymph nodes measuring 3 cm.   Prior Therapy: He was treated with Lupron therapy at the time of diagnosis and had an excellent response with PSA down to 0.97 back in August 2015.  His PSA did rise to 18.76 in April 2016 and most recently up to 30.85 on 10/19/2014. At that time he was started on Xtandi and tolerated it poorly. He reported muscle cramps and took it for one week only.  He had been off any treatment other than Lupron since that time. His most recent bone scan was done in May 2016 showed a questionable L2 sclerotic lesion although an MRI obtained at that time of the lumbar spine did not show any evidence of bony metastasis.  Current therapy: Zytiga for a total dose of 1000 mg daily started on 02/15/2015. Tivicay and Descovy for his HIV. These were changed in December 2016 to prevent interactions with Zytiga.  Interim History: Dean Carlson presents today for a follow-up visit. Since his last visit, he reports increased fatigue associated with Zytiga. He reports decline in his energy and his ability to perform activities of daily living. Despite that he still able to live independently and care for himself. Has not reported any lower extremity edema, nausea, anorexia or weight loss.   He is concern overall about his quality of life with this medication. He feel that his quality of life is not adequate and this medication at times interfere with his ability to enjoy his life. He cites primary fatigue as the main reason.   He does not report any headaches, blurry vision, syncope or  seizures. Does not report any fevers, chills, sweats or weight loss. Does not report any chest pain, palpitation orthopnea or leg edema. Does not report any cough, hemoptysis or hematemesis. As that report any nausea, vomiting or abdominal pain. Does not report any skeletal complaints of arthralgias or myalgias. Remaining review of systems unremarkable.   Medications: I have reviewed the patient's current medications.  Current Outpatient Prescriptions  Medication Sig Dispense Refill  . abiraterone Acetate (ZYTIGA) 250 MG tablet Take 4 tablets by mouth once daily. Take on empty stomach 1 hour before or 2 hours after a meal. 120 tablet 1  . Ascorbic Acid (VITAMIN C PO) Take 1 tablet by mouth daily.    . dolutegravir (TIVICAY) 50 MG tablet Take 1 tablet (50 mg total) by mouth daily. 30 tablet 11  . emtricitabine-tenofovir AF (DESCOVY) 200-25 MG tablet Take 1 tablet by mouth daily. 30 tablet 11   No current facility-administered medications for this visit.     Allergies:  Allergies  Allergen Reactions  . Alka-Seltzer [Aspirin Effervescent] Other (See Comments)    asthma  . Excedrin Extra Strength [Aspirin-Acetaminophen-Caffeine] Other (See Comments)    REACTION: asthma    Past Medical History, Surgical history, Social history, and Family History were reviewed and updated.   Physical Exam: Blood pressure 129/86, pulse 83, temperature 98.5 F (36.9 C), temperature source Oral, resp. rate 19, height 5\' 11"  (1.803 m), weight 221 lb 6.4 oz (100.426 kg), SpO2 100 %. ECOG: 1 General appearance: Alert,  awake gentleman appeared without distress. Head: Normocephalic, without obvious abnormality no oral thrush noted. Neck: no adenopathy no thyroid masses. Lymph nodes: Cervical, supraclavicular, and axillary nodes normal. Heart:regular rate and rhythm, S1, S2 normal, no murmur, click, rub or gallop Lung:chest clear, no wheezing, rales, normal symmetric air entry Abdomin: soft, non-tender, without  masses or organomegaly no rebound or guarding. EXT:no erythema, induration, or nodules   Lab Results: Lab Results  Component Value Date   WBC 4.8 07/12/2015   HGB 13.1 07/12/2015   HCT 39.0 07/12/2015   MCV 85.9 07/12/2015   PLT 198 07/12/2015     Chemistry      Component Value Date/Time   NA 142 04/25/2015 0848   NA 142 04/09/2015 1227   K 3.5 04/25/2015 0848   K 3.6 04/09/2015 1227   CL 104 04/09/2015 1227   CO2 28 04/25/2015 0848   CO2 30 04/09/2015 1227   BUN 11.5 04/25/2015 0848   BUN 8 04/09/2015 1227   CREATININE 0.9 04/25/2015 0848   CREATININE 0.78 04/09/2015 1227   CREATININE 0.84 03/09/2015 1802      Component Value Date/Time   CALCIUM 9.4 04/25/2015 0848   CALCIUM 8.6 04/09/2015 1227   ALKPHOS 177* 04/25/2015 0848   ALKPHOS 147* 04/09/2015 1227   AST 15 04/25/2015 0848   AST 18 04/09/2015 1227   ALT 15 04/25/2015 0848   ALT 14 04/09/2015 1227   BILITOT 0.56 04/25/2015 0848   BILITOT 0.2 04/09/2015 1227       Impression and Plan:   54 year old gentleman with the following issues:  1. 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 His initial diagnosis back in June 2014 where he presented with a Gleason score 4+5 = 9 and a PSA of 230. After a period of androgen deprivation and a PSA nadir of less than 1, he started developing a rapid rise in his PSA and have been intolerant to [place].  CT scan on January 2017 was reviewed which showed pelvic adenopathy and potential 1.9 cm sclerotic lesion at the L2 vertebral body.  His PSA was up to 9.20 and was started on Zytiga in December 2016. After 3 months of therapy his PSA did decrease slightly to 6.0. He is reporting more side effects associated with this medication predominantly fatigue and tiredness. After discussion today, we have decided to decrease the dose to 500 mg total dose which is a 50% dose reduction. I feel that this will improve his quality of life  dramatically and hopefully maintains adequate control of his cancer.  The duration of therapy was discussed again with him as well as the goal of therapy. He understands he has incurable malignancy but his cancer can be reasonably controlled for an extended period of time with this medication and possibly others. He is willing to continue on this medication with a reduced dose for the time being.  His overall prognosis from this cancer depends on his response to therapy. This disease can be palliated for a number of years but certainly not curable as it was discussed previously.     2. Androgen depravation: I have recommended continuing that indefinitely.  3. HIV: He was switched from Atripla toTivicay and Descovy by Dr. Tommy Carlson. No complications associated with these medication noted at this time.  4. LFT monitoring: His liver function tests on 04/25/2015 were within normal range and will be repeated today.  5. Electrolyte imbalance: Potassium will be repeated today.  6. Adrenal  insufficiency: This will be monitored closely because of the potential problems associated with Zytiga. He has no signs or symptoms of that at this time.  7. Follow-up: Will be in 6 weeks for evaluation.   Zola Button, MD 5/11/20172:55 PM

## 2015-07-12 NOTE — Telephone Encounter (Signed)
Gave pt apt & avs °

## 2015-07-13 ENCOUNTER — Telehealth: Payer: Self-pay | Admitting: *Deleted

## 2015-07-13 LAB — PSA: Prostate Specific Ag, Serum: 9.2 ng/mL — ABNORMAL HIGH (ref 0.0–4.0)

## 2015-07-13 NOTE — Telephone Encounter (Signed)
As noted below by Dr. Alen Blew, I informed patient of his PSA level. Patient verbalized understanding and knows to continue Zytiga.

## 2015-07-13 NOTE — Telephone Encounter (Signed)
-----   Message from Wyatt Portela, MD sent at 07/13/2015  9:12 AM EDT ----- Please let him know his PSA is up to 9. I recommned continue Zytiga for now and repeat PSA next visit (set up already)

## 2015-07-19 DIAGNOSIS — C61 Malignant neoplasm of prostate: Secondary | ICD-10-CM | POA: Diagnosis not present

## 2015-08-06 ENCOUNTER — Ambulatory Visit: Payer: Medicare Other | Admitting: Sports Medicine

## 2015-08-10 DIAGNOSIS — E291 Testicular hypofunction: Secondary | ICD-10-CM | POA: Diagnosis not present

## 2015-08-10 DIAGNOSIS — C61 Malignant neoplasm of prostate: Secondary | ICD-10-CM | POA: Diagnosis not present

## 2015-08-30 ENCOUNTER — Telehealth: Payer: Self-pay | Admitting: *Deleted

## 2015-08-30 ENCOUNTER — Ambulatory Visit (HOSPITAL_BASED_OUTPATIENT_CLINIC_OR_DEPARTMENT_OTHER): Payer: Medicare Other | Admitting: Oncology

## 2015-08-30 ENCOUNTER — Other Ambulatory Visit (HOSPITAL_BASED_OUTPATIENT_CLINIC_OR_DEPARTMENT_OTHER): Payer: Medicare Other

## 2015-08-30 VITALS — BP 140/85 | HR 76 | Temp 99.0°F | Resp 18 | Ht 71.0 in | Wt 217.2 lb

## 2015-08-30 DIAGNOSIS — C61 Malignant neoplasm of prostate: Secondary | ICD-10-CM

## 2015-08-30 DIAGNOSIS — Z21 Asymptomatic human immunodeficiency virus [HIV] infection status: Secondary | ICD-10-CM

## 2015-08-30 DIAGNOSIS — C775 Secondary and unspecified malignant neoplasm of intrapelvic lymph nodes: Secondary | ICD-10-CM

## 2015-08-30 LAB — COMPREHENSIVE METABOLIC PANEL
ALT: 15 U/L (ref 0–55)
AST: 14 U/L (ref 5–34)
Albumin: 3.7 g/dL (ref 3.5–5.0)
Alkaline Phosphatase: 210 U/L — ABNORMAL HIGH (ref 40–150)
Anion Gap: 9 mEq/L (ref 3–11)
BUN: 19.6 mg/dL (ref 7.0–26.0)
CALCIUM: 9.2 mg/dL (ref 8.4–10.4)
CHLORIDE: 105 meq/L (ref 98–109)
CO2: 28 meq/L (ref 22–29)
Creatinine: 0.9 mg/dL (ref 0.7–1.3)
EGFR: >90 mL/min/{1.73_m2} (ref >90)
GLUCOSE: 99 mg/dL (ref 70–140)
POTASSIUM: 3.7 meq/L (ref 3.5–5.1)
Sodium: 142 mEq/L (ref 136–145)
TOTAL PROTEIN: 7.6 g/dL (ref 6.4–8.3)
Total Bilirubin: 0.49 mg/dL (ref 0.20–1.20)

## 2015-08-30 LAB — CBC WITH DIFFERENTIAL/PLATELET
BASO%: 0.9 % (ref 0.0–2.0)
Basophils Absolute: 0.1 10*3/uL (ref 0.0–0.1)
EOS ABS: 0.1 10*3/uL (ref 0.0–0.5)
EOS%: 2.3 % (ref 0.0–7.0)
HCT: 38.3 % — ABNORMAL LOW (ref 38.4–49.9)
HEMOGLOBIN: 12.7 g/dL — AB (ref 13.0–17.1)
LYMPH#: 1.9 10*3/uL (ref 0.9–3.3)
LYMPH%: 35.5 % (ref 14.0–49.0)
MCH: 29.1 pg (ref 27.2–33.4)
MCHC: 33.1 g/dL (ref 32.0–36.0)
MCV: 87.7 fL (ref 79.3–98.0)
MONO#: 0.7 10*3/uL (ref 0.1–0.9)
MONO%: 12.1 % (ref 0.0–14.0)
NEUT%: 49.2 % (ref 39.0–75.0)
NEUTROS ABS: 2.7 10*3/uL (ref 1.5–6.5)
PLATELETS: 196 10*3/uL (ref 140–400)
RBC: 4.36 10*6/uL (ref 4.20–5.82)
RDW: 15 % — AB (ref 11.0–14.6)
WBC: 5.5 10*3/uL (ref 4.0–10.3)

## 2015-08-30 NOTE — Telephone Encounter (Signed)
Received call from patient stating that [facility] needs a new prescription for Zytiga for the (2 tablet a day) regimen. Message sent to MD Wandalee Ferdinand

## 2015-08-30 NOTE — Progress Notes (Signed)
Hematology and Oncology Follow Up Visit  Dean Carlson [id number] [birth year] 55 y.o. 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 He was diagnosed in 2014 where he had a Gleason score 4+5 = 9 and PSA of 230. He had disease including and to the periaortic lymph nodes measuring 3 cm.   Prior Therapy: He was treated with Lupron therapy at the time of diagnosis and had an excellent response with PSA down to 0.97 back in August 2015.  His PSA did rise to 18.76 in April 2016 and most recently up to 30.85 on 10/19/2014. At that time he was started on Xtandi and tolerated it poorly. He reported muscle cramps and took it for one week only.  He had been off any treatment other than Lupron since that time. His most recent bone scan was done in May 2016 showed a questionable L2 sclerotic lesion although an MRI obtained at that time of the lumbar spine did not show any evidence of bony metastasis.  Current therapy: Zytiga for a total dose of 1000 mg daily started on 02/15/2015. His dose was changed to 500 mg starting on 08/30/2015. Tivicay and Descovy for his HIV. These were changed in December 2016 to prevent interactions with Zytiga.  Interim History: Dean Carlson presents today for a follow-up visit. Since his last visit, he reports few complaints related to [facility]. He does report some occasional hip pain and continues to have increased fatigue. He reports decline in his energy and his ability to perform activities of daily living. He tried reducing the dose of Zytiga on the last few days of felt slightly better. He denied any nausea or vomiting and continues to you well although he lost 4 pounds. He is concerned about his quality of life and his ability to play music because of this medication.    He does not report any headaches, blurry vision,  syncope or seizures. Does not report any fevers, chills, sweats or weight loss. Does not report any chest pain, palpitation orthopnea or leg edema. Does not report any cough, hemoptysis or hematemesis. As that report any nausea, vomiting or abdominal pain. Does not report any skeletal complaints of arthralgias or myalgias. Remaining review of systems unremarkable.   Medications: I have reviewed the patient's current medications.  Current Outpatient Prescriptions  Medication Sig Dispense Refill  . abiraterone Acetate (ZYTIGA) 250 MG tablet Take 4 tablets by mouth once daily. Take on empty stomach 1 hour before or 2 hours after a meal. 120 tablet 1  . Ascorbic Acid (VITAMIN C PO) Take 1 tablet by mouth daily.    . dolutegravir (TIVICAY) 50 MG tablet Take 1 tablet (50 mg total) by mouth daily. 30 tablet 11  . emtricitabine-tenofovir AF (DESCOVY) 200-25 MG tablet Take 1 tablet by mouth daily. 30 tablet 11   No current facility-administered medications for this visit.     Allergies:  Allergies  Allergen Reactions  . Alka-Seltzer [Aspirin Effervescent] Other (See Comments)    asthma  . Excedrin Extra Strength [Aspirin-Acetaminophen-Caffeine] Other (See Comments)    REACTION: asthma    Past Medical History, Surgical history, Social history, and Family History were reviewed and updated.   Physical Exam: Blood pressure 140/85, pulse 76, temperature 99 F (37.2 C), temperature source Oral, resp. rate 18, height 5\' 11"  (1.803 m), weight 217 lb 3.2 oz (98.521 kg), SpO2 100 %.  ECOG: 1 General appearance: Pleasant-appearing gentleman without distress. Head: Normocephalic, without obvious abnormality no oral ulcers or lesions. Neck: no adenopathy no thyroid masses. Lymph nodes: Cervical, supraclavicular, and axillary nodes normal. Heart:regular rate and rhythm, S1, S2 normal, no murmur, click, rub or gallop Lung:chest clear, no wheezing, rales, normal symmetric air entry Abdomin: soft,  non-tender, without masses or organomegaly no shifting dullness or ascites. EXT:no erythema, induration, or nodules   Lab Results: Lab Results  Component Value Date   WBC 5.5 08/30/2015   HGB 12.7* 08/30/2015   HCT 38.3* 08/30/2015   MCV 87.7 08/30/2015   PLT 196 08/30/2015     Chemistry      Component Value Date/Time   NA 142 07/12/2015 1413   NA 142 04/09/2015 1227   K 3.9 07/12/2015 1413   K 3.6 04/09/2015 1227   CL 104 04/09/2015 1227   CO2 29 07/12/2015 1413   CO2 30 04/09/2015 1227   BUN 10.5 07/12/2015 1413   BUN 8 04/09/2015 1227   CREATININE 0.9 07/12/2015 1413   CREATININE 0.78 04/09/2015 1227   CREATININE 0.84 03/09/2015 1802      Component Value Date/Time   CALCIUM 9.4 07/12/2015 1413   CALCIUM 8.6 04/09/2015 1227   ALKPHOS 200* 07/12/2015 1413   ALKPHOS 147* 04/09/2015 1227   AST 16 07/12/2015 1413   AST 18 04/09/2015 1227   ALT 29 07/12/2015 1413   ALT 14 04/09/2015 1227   BILITOT 0.39 07/12/2015 1413   BILITOT 0.2 04/09/2015 1227       Impression and Plan:   55 year old gentleman with the following issues:  1. 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 His initial diagnosis back in June 2014 where he presented with a Gleason score 4+5 = 9 and a PSA of 230. After a period of androgen deprivation and a PSA nadir of less than 1, he started developing a rapid rise in his PSA and have been intolerant to [place].  CT scan on January 2017 was reviewed which showed pelvic adenopathy and potential 1.9 cm sclerotic lesion at the L2 vertebral body.  His PSA was up to 9.20 and was started on Zytiga in December 2016. He continues to have issues with Zytiga at the full dose and I have recommended reducing the dose to 500 mg daily. His PSA continues to be relatively stable although recently increased up to 9. We have discussed different treatment options if he cannot tolerate the reduced dose or his PSA continues to rise.  These would include Xtandi or systemic chemotherapy.  The duration of therapy was discussed again with him as well as the goal of therapy. He understands he has incurable malignancy but his cancer can be reasonably controlled for an extended period of time with this medication and possibly others. Although he is now willing to proceed with systemic chemotherapy if needed to. This will continue to be addressed with him in future visits.   2. Androgen depravation: I have recommended continuing that indefinitely.  3. HIV: He was switched from Atripla toTivicay and Descovy by Dr. Tommy Carlson. No complications associated with these medication noted at this time.  4. LFT monitoring: His liver function tests on 07/12/2015 were reviewed and all within normal range.  5. Electrolyte imbalance: Potassium continues to be within normal range.  6. Adrenal insufficiency: This will be monitored closely because of the potential problems associated with Zytiga. He has no signs or symptoms of that at this time.  7. Follow-up: Will be in August 2017.   Zola Button, MD 6/29/20171:03 PM

## 2015-08-31 LAB — PSA: Prostate Specific Ag, Serum: 14 ng/mL — ABNORMAL HIGH (ref 0.0–4.0)

## 2015-09-07 ENCOUNTER — Encounter: Payer: Self-pay | Admitting: *Deleted

## 2015-09-10 ENCOUNTER — Other Ambulatory Visit: Payer: Self-pay | Admitting: *Deleted

## 2015-09-10 ENCOUNTER — Ambulatory Visit (INDEPENDENT_AMBULATORY_CARE_PROVIDER_SITE_OTHER): Payer: Medicare Other

## 2015-09-10 ENCOUNTER — Encounter: Payer: Self-pay | Admitting: Sports Medicine

## 2015-09-10 ENCOUNTER — Ambulatory Visit (INDEPENDENT_AMBULATORY_CARE_PROVIDER_SITE_OTHER): Payer: Medicare Other | Admitting: Sports Medicine

## 2015-09-10 DIAGNOSIS — Q828 Other specified congenital malformations of skin: Secondary | ICD-10-CM | POA: Diagnosis not present

## 2015-09-10 DIAGNOSIS — C61 Malignant neoplasm of prostate: Secondary | ICD-10-CM

## 2015-09-10 DIAGNOSIS — M79671 Pain in right foot: Secondary | ICD-10-CM | POA: Diagnosis not present

## 2015-09-10 MED ORDER — ABIRATERONE ACETATE 250 MG PO TABS: ORAL_TABLET | ORAL | Status: DC

## 2015-09-10 NOTE — Progress Notes (Signed)
Patient ID: Dean Carlson, male   DOB: Aug 27, 1960, 55 y.o.   MRN: [id number] Subjective: Dean Carlson is a 55 y.o. male patient who presents to office for evaluation of Right foot pain secondary to callus skin. Patient complains of pain at the lesion present Right foot at the balls x many years reports that it feels like he stepped on something. Patient has tried trimming with no relief in symptoms. Patient denies any other pedal complaints.   Patient Active Problem List   Diagnosis Date Noted  . Adjustment disorder with mixed anxiety and depressed mood 05/09/2015  . Insomnia 05/09/2015  . Weight gain 05/09/2015  . Dizziness 05/09/2015  . Grief 05/09/2015  . Encounter for chemotherapy management 02/09/2015  . Allergic reaction 09/28/2014  . Prostate cancer ([place]) 11/11/2012  . Elevated PSA 11/17/2011  . BPH (benign prostatic hyperplasia) 11/17/2011  . Prostatitis 07/22/2011  . Testicular pain 07/21/2011  . Colon cancer screening 05/13/2011  . Rectal bleeding 05/13/2011  . Abdominal pain, other specified site 04/23/2011  . Screening for STD (sexually transmitted disease) 07/22/2010  . Hypogonadism male 07/22/2010  . Smoker 07/22/2010  . MOOD DISORDER IN CONDITIONS CLASSIFIED ELSEWHERE 04/26/2009  . INSOMNIA UNSPECIFIED 10/30/2008  . SKIN RASH 01/24/2008  . HYPERLIPIDEMIA 08/24/2007  . ERECTILE DYSFUNCTION 08/24/2007  . HYPERGLYCEMIA 08/24/2007  . DISORDER, DEPRESSIVE NEC 12/31/2006  . CHEST PAIN, NON-CARDIAC 12/31/2006  . Human immunodeficiency virus (HIV) disease ([place]) 04/14/2006  . HSV 04/14/2006  . PNEUMOCYSTIS PNEUMONIA 04/14/2006    Current Outpatient Prescriptions on File Prior to Visit  Medication Sig Dispense Refill  . Ascorbic Acid (VITAMIN C PO) Take 1 tablet by mouth daily.    . dolutegravir (TIVICAY) 50 MG tablet Take 1 tablet (50 mg total) by mouth daily. 30 tablet 11  . emtricitabine-tenofovir AF (DESCOVY) 200-25 MG tablet Take 1 tablet by mouth daily. 30 tablet 11    No current facility-administered medications on file prior to visit.    Allergies  Allergen Reactions  . Alka-Seltzer [Aspirin Effervescent] Other (See Comments)    asthma  . Excedrin Extra Strength [Aspirin-Acetaminophen-Caffeine] Other (See Comments)    REACTION: asthma    Objective:  General: Alert and oriented x3 in no acute distress  Dermatology: Keratotic lesion present sub met 3 on right with skin lines transversing the lesion, pain is present with direct pressure to the lesion with a central nucleated core noted, no webspace macerations, no ecchymosis bilateral, all nails x 10 are well manicured.  Vascular: Dorsalis Pedis and Posterior Tibial pedal pulses 2/4, Capillary Fill Time 3 seconds, + pedal hair growth bilateral, no edema bilateral lower extremities, Temperature gradient within normal limits.  Neurology: Johney Maine sensation intact via light touch bilateral.  Musculoskeletal: Mild tenderness with palpation at the keratotic lesion site on Right, Muscular strength 5/5 in all groups without pain or limitation on range of motion. Asymptomatic hammertoe deformity noted.  Xray, Right foot: No foreign body, mild calcaneal spur and lesser hammertoe. No other acute findings. Assessment and Plan: Problem List Items Addressed This Visit    None    Visit Diagnoses    Right foot pain    -  Primary    Relevant Orders    DG Foot 2 Views Right    Porokeratosis          -Complete examination performed -Xrays reviewed -Discussed treatment options -Parred keratoic lesion using a chisel blade; treated the area withSalinocaine covered with moleskin, remove in 1 day -Encouraged daily skin emollients -Encouraged use  of pumice stone -Advised good supportive shoes and inserts -Patient to return to office as needed or sooner if condition worsens.  Dean Carlson, DPM

## 2015-09-14 ENCOUNTER — Other Ambulatory Visit: Payer: Self-pay | Admitting: *Deleted

## 2015-09-14 ENCOUNTER — Telehealth: Payer: Self-pay | Admitting: Pharmacist

## 2015-09-14 DIAGNOSIS — C61 Malignant neoplasm of prostate: Secondary | ICD-10-CM

## 2015-09-14 MED ORDER — ABIRATERONE ACETATE 250 MG PO TABS: ORAL_TABLET | ORAL | Status: DC

## 2015-09-14 NOTE — Telephone Encounter (Signed)
Received VM from [facility] regarding the Zytiga dose for this patient. Pt stated to [facility] his dose is 2 tablets daily. This information was confirmed per the last MD office note. [facility] needs an updated rx with the updated dose. Please fax this information to fax: [phone] Phone: [phone] Ext:[phone]  I informed Dr. Hazeline Junker RN of above need and information.  Raul Del PharmD, BCPS, [facility] [phone]

## 2015-09-21 ENCOUNTER — Telehealth: Payer: Self-pay | Admitting: *Deleted

## 2015-09-21 NOTE — Telephone Encounter (Signed)
Since taking the zytiga, Patient calling to say he is having discomfort on his right side, right hip  And knees. C/o blisters on his legs, he thinks from the sun, coughs at night and feels like his equilibrium is off, if he turns his head quickly, he becomes dizzy. Is taking tylenol for the discomfort. States he would be unable to come in today d/t transportation and financial issues, he just wanted dr Alen Blew to be aware for his next scheduled visit 10/09/15. Denies fever.

## 2015-09-24 ENCOUNTER — Telehealth: Payer: Self-pay | Admitting: *Deleted

## 2015-09-24 NOTE — Telephone Encounter (Signed)
Patient calling to say he took activated charcoal for gas for 2-3 days, the first part of this month. Per dr Alen Blew, he should take gas-x from now on. Patient verbalized understanding.

## 2015-09-24 NOTE — Telephone Encounter (Signed)
See last note

## 2015-10-05 ENCOUNTER — Other Ambulatory Visit (INDEPENDENT_AMBULATORY_CARE_PROVIDER_SITE_OTHER): Payer: Medicare Other

## 2015-10-05 DIAGNOSIS — B20 Human immunodeficiency virus [HIV] disease: Secondary | ICD-10-CM | POA: Diagnosis not present

## 2015-10-05 LAB — CBC WITH DIFFERENTIAL/PLATELET
BASOS PCT: 1 %
Basophils Absolute: 59 cells/uL (ref 0–200)
EOS ABS: 177 {cells}/uL (ref 15–500)
Eosinophils Relative: 3 %
HEMATOCRIT: 33.1 % — AB (ref 38.5–50.0)
HEMOGLOBIN: 10.9 g/dL — AB (ref 13.2–17.1)
LYMPHS ABS: 2242 {cells}/uL (ref 850–3900)
Lymphocytes Relative: 38 %
MCH: 28.9 pg (ref 27.0–33.0)
MCHC: 32.9 g/dL (ref 32.0–36.0)
MCV: 87.8 fL (ref 80.0–100.0)
MONO ABS: 708 {cells}/uL (ref 200–950)
MPV: 10.8 fL (ref 7.5–12.5)
Monocytes Relative: 12 %
NEUTROS ABS: 2714 {cells}/uL (ref 1500–7800)
Neutrophils Relative %: 46 %
PLATELETS: 198 10*3/uL (ref 140–400)
RBC: 3.77 MIL/uL — ABNORMAL LOW (ref 4.20–5.80)
RDW: 14.7 % (ref 11.0–15.0)
WBC: 5.9 10*3/uL (ref 3.8–10.8)

## 2015-10-05 LAB — COMPLETE METABOLIC PANEL WITH GFR
ALT: 10 U/L (ref 9–46)
AST: 11 U/L (ref 10–35)
Albumin: 3.6 g/dL (ref 3.6–5.1)
Alkaline Phosphatase: 189 U/L — ABNORMAL HIGH (ref 40–115)
BUN: 13 mg/dL (ref 7–25)
CHLORIDE: 106 mmol/L (ref 98–110)
CO2: 27 mmol/L (ref 20–31)
Calcium: 8.5 mg/dL — ABNORMAL LOW (ref 8.6–10.3)
Creat: 0.89 mg/dL (ref 0.70–1.33)
GFR, Est African American: >89 mL/min (ref >=60)
GFR, Est Non African American: >89 mL/min (ref >=60)
GLUCOSE: 98 mg/dL (ref 65–99)
POTASSIUM: 4 mmol/L (ref 3.5–5.3)
SODIUM: 141 mmol/L (ref 135–146)
Total Bilirubin: 0.2 mg/dL (ref 0.2–1.2)
Total Protein: 6 g/dL — ABNORMAL LOW (ref 6.1–8.1)

## 2015-10-05 LAB — T-HELPER CELL (CD4) - (RCID CLINIC ONLY)
CD4 % Helper T Cell: 26 % — ABNORMAL LOW (ref 33–55)
CD4 T Cell Abs: 580 /uL (ref 400–2700)

## 2015-10-06 LAB — RPR

## 2015-10-08 LAB — HIV-1 RNA QUANT-NO REFLEX-BLD
HIV 1 RNA Quant: <20 copies/mL (ref <20)
HIV-1 RNA Quant, Log: <1.3 Log copies/mL (ref <1.30)

## 2015-10-09 ENCOUNTER — Other Ambulatory Visit: Payer: Medicare Other

## 2015-10-09 ENCOUNTER — Ambulatory Visit (HOSPITAL_BASED_OUTPATIENT_CLINIC_OR_DEPARTMENT_OTHER): Payer: Medicare Other | Admitting: Oncology

## 2015-10-09 ENCOUNTER — Encounter: Payer: Self-pay | Admitting: Oncology

## 2015-10-09 VITALS — BP 163/87 | HR 78 | Temp 98.7°F | Resp 20

## 2015-10-09 DIAGNOSIS — C775 Secondary and unspecified malignant neoplasm of intrapelvic lymph nodes: Secondary | ICD-10-CM | POA: Diagnosis not present

## 2015-10-09 DIAGNOSIS — C61 Malignant neoplasm of prostate: Secondary | ICD-10-CM | POA: Diagnosis not present

## 2015-10-09 DIAGNOSIS — B20 Human immunodeficiency virus [HIV] disease: Secondary | ICD-10-CM

## 2015-10-09 DIAGNOSIS — E2740 Unspecified adrenocortical insufficiency: Secondary | ICD-10-CM | POA: Diagnosis not present

## 2015-10-09 NOTE — Progress Notes (Signed)
Hematology and Oncology Follow Up Visit  Dean Carlson [id number] 1960-11-17 55 y.o. 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 He was diagnosed in 2014 where he had a Gleason score 4+5 = 9 and PSA of 230. He had disease including and to the periaortic lymph nodes measuring 3 cm.   Prior Therapy: He was treated with Lupron therapy at the time of diagnosis and had an excellent response with PSA down to 0.97 back in August 2015.  His PSA did rise to 18.76 in April 2016 and most recently up to 30.85 on 10/19/2014. At that time he was started on Xtandi and tolerated it poorly. He reported muscle cramps and took it for one week only.  He had been off any treatment other than Lupron since that time. His most recent bone scan was done in May 2016 showed a questionable L2 sclerotic lesion although an MRI obtained at that time of the lumbar spine did not show any evidence of bony metastasis.  Current therapy: Zytiga for a total dose of 1000 mg daily started on 02/15/2015. His dose was changed to 500 mg starting on 08/30/2015. Tivicay and Descovy for his HIV. These were changed in December 2016 to prevent interactions with Zytiga.  Interim History: Dean Carlson presents today for a follow-up visit. Since his last visit, he reports no major changes in his health. He has been eating better and gained some weight. His exercise tolerance have improved slightly although he does report mild fatigue. He does report periodic right-sided hip pain which have not changed dramatically.  He continues to tolerate Zytiga at a lower dose of 500 mg daily without any new complications. He does have lower extremity edema which is not dramatically changed. His degree fatigue or tiredness is about the same. He denied any GI toxicity such as nausea, vomiting or abdominal  pain. His quality of life remains reasonable at this time and continue stable to do the things he enjoys. 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   He does not report any headaches, blurry vision, syncope or seizures. Does not report any fevers, chills, sweats or weight loss. Does not report any chest pain, palpitation orthopnea or leg edema. Does not report any cough, hemoptysis or hematemesis. He does not report any major changes in his bowel habits or satiety. Does not report any skeletal complaints of arthralgias or myalgias. Remaining review of systems unremarkable.   Medications: No changes in his medication noted by my review. Current Outpatient Prescriptions  Medication Sig Dispense Refill  . abiraterone Acetate (ZYTIGA) 250 MG tablet Take 2 tablets by mouth once daily. Take on empty stomach 1 hour before or 2 hours after a meal. 60 tablet 0  . Ascorbic Acid (VITAMIN C PO) Take 1 tablet by mouth daily.    . dolutegravir (TIVICAY) 50 MG tablet Take 1 tablet (50 mg total) by mouth daily. 30 tablet 11  . emtricitabine-tenofovir AF (DESCOVY) 200-25 MG tablet Take 1 tablet by mouth daily. 30 tablet 11   No current facility-administered medications for this visit.      Allergies:  Allergies  Allergen Reactions  . Alka-Seltzer [Aspirin Effervescent] Other (See Comments)    asthma  . Excedrin Extra Strength [Aspirin-Acetaminophen-Caffeine] Other (See Comments)    REACTION: asthma    Past Medical History, Surgical  history, Social history, and Family History were reviewed and updated.   Physical Exam: Blood pressure (!) 163/87, pulse 78, temperature 98.7 F (37.1 C), temperature source Oral, resp. rate 20, SpO2 100 %. ECOG: 1 General appearance: Alert, awake gentleman without distress. Head: Normocephalic, without obvious abnormality no oral thrush noted. Neck: no adenopathy no thyroid masses. Lymph nodes: Cervical,  supraclavicular, and axillary nodes normal. Heart:regular rate and rhythm, S1, S2 normal, no murmur, click, rub or gallop Lung:chest clear, no wheezing, rales, normal symmetric air entry Abdomin: soft, non-tender, without masses or organomegaly no rebound or guarding. EXT:no erythema, induration, or nodules   Lab Results: Lab Results  Component Value Date   WBC 5.9 10/05/2015   HGB 10.9 (L) 10/05/2015   HCT 33.1 (L) 10/05/2015   MCV 87.8 10/05/2015   PLT 198 10/05/2015     Chemistry      Component Value Date/Time   NA 141 10/05/2015 0922   NA 142 08/30/2015 1226   K 4.0 10/05/2015 0922   K 3.7 08/30/2015 1226   CL 106 10/05/2015 0922   CO2 27 10/05/2015 0922   CO2 28 08/30/2015 1226   BUN 13 10/05/2015 0922   BUN 19.6 08/30/2015 1226   CREATININE 0.89 10/05/2015 0922   CREATININE 0.9 08/30/2015 1226      Component Value Date/Time   CALCIUM 8.5 (L) 10/05/2015 0922   CALCIUM 9.2 08/30/2015 1226   ALKPHOS 189 (H) 10/05/2015 0922   ALKPHOS 210 (H) 08/30/2015 1226   AST 11 10/05/2015 0922   AST 14 08/30/2015 1226   ALT 10 10/05/2015 0922   ALT 15 08/30/2015 1226   BILITOT 0.2 10/05/2015 0922   BILITOT 0.49 08/30/2015 1226     Results for Dean Carlson (MRN [id number]) as of 10/09/2015 13:49  Ref. Range 04/25/2015 08:48 07/12/2015 14:13 08/30/2015 12:26  PSA Latest Ref Range: 0.0 - 4.0 ng/mL 6.0 (H) 9.2 (H) 14.0 (H)    Impression and Plan:   55 year old gentleman with the following issues:  1. 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 His initial diagnosis back in June 2014 where he presented with a Gleason score 4+5 = 9 and a PSA of 230. After a period of androgen deprivation and a PSA nadir of less than 1, he started developing a rapid rise in his PSA and have been intolerant to [place].  CT scan on January 2017 was reviewed which showed pelvic adenopathy and potential 1.9 cm sclerotic lesion at the L2 vertebral body.  He was  started on Zytiga but tolerated the full dose poorly and currently at 500 mg daily since June 2017. This regimen seems to have improved his overall quality of life and agreeable to continue. His PSA did did go up to 14 but his overall disease status remains stable. His laboratory data obtained on 10/05/2015 was personally reviewed and showed no electrolyte imbalance or abnormalities in his liver function test.  We have discussed the role of alternative therapy if his disease progresses on Zytiga. That would be systemic chemotherapy which can affect his quality of life dramatically and we would like to defer that as long as possible.  The duration of therapy was discussed again with him as well as the goal of therapy. He understands he has incurable malignancy but his disease status remains reasonably controlled and no major changes is recommended at this time.  The plan is to continue Zytiga with the same dose and schedule and repeat his PSA with  the next visit in September 2017.   2. Androgen depravation: I have recommended continuing that indefinitely.  3. HIV: He was switched from Atripla toTivicay and Descovy by Dr. Tommy Carlson. His CD4 count on 10/05/2015 is 580.  4. LFT monitoring: His liver function tests on 10/05/2015 is within normal range.  5. Electrolyte surveillance: His potassium and the rest of his electrolytes are within normal range.  6. Adrenal insufficiency: This will be monitored closely because of the potential problems associated with Zytiga. He has no signs or symptoms of that at this time.  7. Follow-up: Will be in September 2017.   Zola Button, MD 8/8/20171:46 PM

## 2015-10-10 ENCOUNTER — Telehealth: Payer: Self-pay | Admitting: Oncology

## 2015-10-10 NOTE — Telephone Encounter (Signed)
spoke w/ pt confirmed 9/15 apts

## 2015-10-17 ENCOUNTER — Ambulatory Visit: Payer: Medicare Other | Admitting: *Deleted

## 2015-10-17 ENCOUNTER — Encounter: Payer: Self-pay | Admitting: Infectious Disease

## 2015-10-17 ENCOUNTER — Ambulatory Visit (INDEPENDENT_AMBULATORY_CARE_PROVIDER_SITE_OTHER): Payer: Medicare Other | Admitting: Infectious Disease

## 2015-10-17 VITALS — BP 150/81 | HR 79 | Temp 98.5°F | Wt 219.2 lb

## 2015-10-17 DIAGNOSIS — C7951 Secondary malignant neoplasm of bone: Secondary | ICD-10-CM

## 2015-10-17 DIAGNOSIS — B20 Human immunodeficiency virus [HIV] disease: Secondary | ICD-10-CM | POA: Diagnosis not present

## 2015-10-17 DIAGNOSIS — F4321 Adjustment disorder with depressed mood: Secondary | ICD-10-CM

## 2015-10-17 DIAGNOSIS — F0630 Mood disorder due to known physiological condition, unspecified: Secondary | ICD-10-CM

## 2015-10-17 DIAGNOSIS — F32A Depression, unspecified: Secondary | ICD-10-CM

## 2015-10-17 DIAGNOSIS — C61 Malignant neoplasm of prostate: Secondary | ICD-10-CM

## 2015-10-17 DIAGNOSIS — F329 Major depressive disorder, single episode, unspecified: Secondary | ICD-10-CM

## 2015-10-17 NOTE — Progress Notes (Signed)
Chief complaint:   follow-up for HIV and depression  Subjective:    Patient ID: Dean Carlson, male    DOB: [birth year], 55 y.o.   MRN: [id number]  HPI  Dean Carlson is 55 year old man with HIV, prostate cancer that was diagnosed while he was on testosterone. He has been followed closely by urology and now by Oncology with Dr. Barbaraann Faster who has  Shane Crutch. We had checked drug-drug interactions and there was a significant one with EFV with Zytiga so we brought him in to change him a regimen that would not interact with Zytiga and changed him to [place] and [place]   Since we last saw him he has maintained perfect virological suppression.  At last visit he had elaborated on the fact that he had experienced loss of former girlfriend from many years back (Mother of his son--who was given up for adoption at the time). Andi had apparently reached out to her due to concerns about his own mortality. Then roughly a month later she died of a cerebral aneurysm. Apparently they shared love for music.   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   He again had a long talk with [place] about his relationship with his former wife and also lengthy exploration of his spiritual beliefs.        Past Medical History:  Diagnosis Date  . Adjustment disorder with mixed anxiety and depressed mood 05/09/2015  . Asthma    as a child  . Dizziness 05/09/2015  . Grief 05/09/2015  . HIV (human immunodeficiency virus infection) ([place])   . Insomnia 05/09/2015  . Prostate cancer ([place])   . Weight gain 05/09/2015    Past Surgical History:  Procedure Laterality Date  . COLONOSCOPY  05/13/2011   Procedure: COLONOSCOPY;  Surgeon: Jerene Bears, MD;  Location: [facility];  Service: Gastroenterology;  Laterality: N/A;  . PROSTATE BIOPSY  08/16/2012    Family History  Problem Relation Age of Onset  . Diabetes    . Cancer Neg Hx       Social History   Social History  .  Marital status: Legally Separated    Spouse name: N/A  . Number of children: N/A  . Years of education: N/A   Occupational History  . permanant disability    Social History Main Topics  . Smoking status: Former Smoker    Packs/day: 1.00    Types: Cigarettes    Quit date: 03/15/2011  . Smokeless tobacco: Never Used  . Alcohol use No     Comment: occ  . Drug use: No  . Sexual activity: Not Currently   Other Topics Concern  . None   Social History Narrative  . None    Allergies  Allergen Reactions  . Alka-Seltzer [Aspirin Effervescent] Other (See Comments)    asthma  . Excedrin Extra Strength [Aspirin-Acetaminophen-Caffeine] Other (See Comments)    REACTION: asthma     Current Outpatient Prescriptions:  .  abiraterone Acetate (ZYTIGA) 250 MG tablet, Take 2 tablets by mouth once daily. Take on empty stomach 1 hour before or 2 hours after a meal., Disp: 60 tablet, Rfl: 0 .  dolutegravir (TIVICAY) 50 MG tablet, Take 1 tablet (50 mg total) by mouth daily., Disp: 30 tablet, Rfl: 11 .  emtricitabine-tenofovir AF (DESCOVY) 200-25 MG tablet, Take 1 tablet by mouth daily., Disp: 30 tablet, Rfl: 11    Review of Systems  Constitutional: Negative for activity change, appetite  change, chills, diaphoresis, fatigue, fever and unexpected weight change.  HENT: Negative for congestion, rhinorrhea, sinus pressure, sneezing, sore throat and trouble swallowing.   Eyes: Negative for photophobia and visual disturbance.  Respiratory: Negative for cough, chest tightness, shortness of breath, wheezing and stridor.   Cardiovascular: Negative for chest pain, palpitations and leg swelling.  Gastrointestinal: Negative for abdominal distention, abdominal pain, anal bleeding, blood in stool, constipation, diarrhea, nausea and vomiting.  Genitourinary: Negative for difficulty urinating, dysuria, flank pain and hematuria.  Musculoskeletal: Negative for arthralgias, back pain, gait problem, joint swelling  and myalgias.  Skin: Negative for color change, pallor, rash and wound.  Neurological: Negative for dizziness, tremors, weakness and light-headedness.  Hematological: Negative for adenopathy. Does not bruise/bleed easily.  Psychiatric/Behavioral: Negative for agitation, behavioral problems, confusion, decreased concentration, dysphoric mood and sleep disturbance.       Objective:   Physical Exam  Constitutional: He is oriented to person, place, and time. He appears well-developed and well-nourished.  HENT:  Head: Normocephalic and atraumatic.  Eyes: Conjunctivae and EOM are normal.  Neck: Normal range of motion. Neck supple.  Cardiovascular: Normal rate and regular rhythm.   Pulmonary/Chest: Effort normal. No respiratory distress. He has no wheezes.  Abdominal: Soft. He exhibits no distension.  Musculoskeletal: Normal range of motion. He exhibits no edema or tenderness.  Neurological: He is alert and oriented to person, place, and time.  Skin: Skin is warm and dry. No rash noted. No erythema. No pallor.  Psychiatric: He has a normal mood and affect. His behavior is normal. Judgment and thought content normal.          Assessment & Plan:   HIV: continue   Tivicay and Descovy in the am  Prostate cancer: to followup with Dr. Barbaraann Faster. PSA is not going down at this moment his joint have repeat lab done in September.   Grief over loss of his girlfriend and mother: counslled him extensively and had him meet with Leveda Anna. I  I spent greater than 40 minutes with the patient including greater than 50% of time in face to face counsel of the patient re his new ARV regimen, his prostate cancer his grief, anxiety, depression,   and in coordination of his care.

## 2015-10-17 NOTE — BH Specialist Note (Signed)
Counselor met with Dean Carlson today in the exam room for a warm hand off due to patient being tearful and sad.  Patient was oriented times four with sad affect. Counselor inquired with patient as to what was going on and the reason he was sad. Patient denied any suicidal ideation. Patient shared that he is tired of dealing with people that they only use and hurt him.  Patient mentioned that he is still grieving over the death of his ex girlfriend who he still loved even though they had broken up previous to her death last year. Patient said that he is also dealing with the fact that he feels that he can never have a special relationship with a woman because of the HIV and feels they will never except him so why try. Counselor provided support and encouragement for patient. Counselor recommended that patient make an appointment as soon as possible to further discuss his feelings.  Patient made an appointment for next week.  Jodi Herring, MA, LPC [facility]  

## 2015-10-23 ENCOUNTER — Other Ambulatory Visit: Payer: Self-pay | Admitting: *Deleted

## 2015-10-23 DIAGNOSIS — C61 Malignant neoplasm of prostate: Secondary | ICD-10-CM

## 2015-10-23 MED ORDER — ABIRATERONE ACETATE 250 MG PO TABS: ORAL_TABLET | ORAL | 0 refills | Status: DC

## 2015-10-30 ENCOUNTER — Emergency Department (HOSPITAL_COMMUNITY): Payer: Medicare Other

## 2015-10-30 ENCOUNTER — Encounter (HOSPITAL_COMMUNITY): Payer: Self-pay | Admitting: Emergency Medicine

## 2015-10-30 ENCOUNTER — Emergency Department (HOSPITAL_COMMUNITY)
Admission: EM | Admit: 2015-10-30 | Discharge: 2015-10-30 | Disposition: A | Discharge status: Home / Self Care | Payer: Medicare Other | Attending: Emergency Medicine | Admitting: Emergency Medicine

## 2015-10-30 DIAGNOSIS — Z87891 Personal history of nicotine dependence: Secondary | ICD-10-CM | POA: Diagnosis not present

## 2015-10-30 DIAGNOSIS — M25559 Pain in unspecified hip: Secondary | ICD-10-CM | POA: Diagnosis not present

## 2015-10-30 DIAGNOSIS — Z79899 Other long term (current) drug therapy: Secondary | ICD-10-CM | POA: Diagnosis not present

## 2015-10-30 DIAGNOSIS — J45909 Unspecified asthma, uncomplicated: Secondary | ICD-10-CM | POA: Diagnosis not present

## 2015-10-30 DIAGNOSIS — M899 Disorder of bone, unspecified: Secondary | ICD-10-CM | POA: Diagnosis not present

## 2015-10-30 DIAGNOSIS — Z21 Asymptomatic human immunodeficiency virus [HIV] infection status: Secondary | ICD-10-CM | POA: Diagnosis not present

## 2015-10-30 DIAGNOSIS — M25551 Pain in right hip: Secondary | ICD-10-CM | POA: Diagnosis present

## 2015-10-30 DIAGNOSIS — C7982 Secondary malignant neoplasm of genital organs: Secondary | ICD-10-CM | POA: Diagnosis not present

## 2015-10-30 MED ORDER — OXYCODONE-ACETAMINOPHEN 5-325 MG PO TABS: ORAL_TABLET | ORAL | 0 refills | Status: DC

## 2015-10-30 MED ORDER — OXYCODONE-ACETAMINOPHEN 5-325 MG PO TABS
1.0000 | ORAL_TABLET | Freq: Once | ORAL | Status: AC
  Administered 2015-10-30: 1 via ORAL
  Filled 2015-10-30: qty 1

## 2015-10-30 NOTE — ED Provider Notes (Signed)
[facility] DEPT Provider Note   CSN: [id number] Arrival date & time: 10/30/15  1104     History   Chief Complaint Chief Complaint  Patient presents with  . Hip Pain    HPI  Blood pressure 141/89, pulse 75, temperature 98.1 F (36.7 C), temperature source Oral, resp. rate 16, weight 99.8 kg, SpO2 100 %.  Dean Carlson is a 55 y.o. male with past medical history significant for well-controlled HIV, active metastatic prostate cancer complaining of acute onset of right hip pain as patient was sitting down to use the restroom, he felt a pop in had immediate pain, the pain radiates down the leg and he is ambulatory but with difficulty, no pain medication taken prior to arrival. He had a prodrome of right gluteal pain over the last several weeks leading up to this but it was never this severe.  HPI  Past Medical History:  Diagnosis Date  . Adjustment disorder with mixed anxiety and depressed mood 05/09/2015  . Asthma    as a child  . Dizziness 05/09/2015  . Grief 05/09/2015  . HIV (human immunodeficiency virus infection) ([place])   . Insomnia 05/09/2015  . Prostate cancer ([place])   . Weight gain 05/09/2015    Patient Active Problem List   Diagnosis Date Noted  . Adjustment disorder with mixed anxiety and depressed mood 05/09/2015  . Insomnia 05/09/2015  . Weight gain 05/09/2015  . Dizziness 05/09/2015  . Grief 05/09/2015  . Encounter for chemotherapy management 02/09/2015  . Allergic reaction 09/28/2014  . Prostate cancer ([place]) 11/11/2012  . Elevated PSA 11/17/2011  . BPH (benign prostatic hyperplasia) 11/17/2011  . Prostatitis 07/22/2011  . Testicular pain 07/21/2011  . Colon cancer screening 05/13/2011  . Rectal bleeding 05/13/2011  . Abdominal pain, other specified site 04/23/2011  . Screening for STD (sexually transmitted disease) 07/22/2010  . Hypogonadism male 07/22/2010  . Smoker 07/22/2010  . MOOD DISORDER IN CONDITIONS CLASSIFIED ELSEWHERE 04/26/2009  . INSOMNIA  UNSPECIFIED 10/30/2008  . SKIN RASH 01/24/2008  . HYPERLIPIDEMIA 08/24/2007  . ERECTILE DYSFUNCTION 08/24/2007  . HYPERGLYCEMIA 08/24/2007  . DISORDER, DEPRESSIVE NEC 12/31/2006  . CHEST PAIN, NON-CARDIAC 12/31/2006  . Human immunodeficiency virus (HIV) disease ([employer]) 04/14/2006  . HSV 04/14/2006  . PNEUMOCYSTIS PNEUMONIA 04/14/2006    Past Surgical History:  Procedure Laterality Date  . COLONOSCOPY  05/13/2011   Procedure: COLONOSCOPY;  Surgeon: Jerene Bears, MD;  Location: [facility];  Service: Gastroenterology;  Laterality: N/A;  . PROSTATE BIOPSY  08/16/2012       Home Medications    Prior to Admission medications   Medication Sig Start Date End Date Taking? Authorizing Provider  abiraterone Acetate (ZYTIGA) 250 MG tablet Take 2 tablets by mouth once daily. Take on empty stomach 1 hour before or 2 hours after a meal. 10/23/15  Yes Wyatt Portela, MD  dolutegravir (TIVICAY) 50 MG tablet Take 1 tablet (50 mg total) by mouth daily. 02/14/15  Yes Truman Hayward, MD  emtricitabine-tenofovir AF (DESCOVY) 200-25 MG tablet Take 1 tablet by mouth daily. 02/14/15  Yes Truman Hayward, MD  leuprolide (LUPRON) 30 MG injection Inject 30 mg into the muscle every 4 (four) months.   Yes Historical Provider, MD  oxyCODONE-acetaminophen (PERCOCET/ROXICET) 5-325 MG tablet 1 to 2 tabs PO q6hrs  PRN for pain 10/30/15   Monico Blitz, PA-C    Family History Family History  Problem Relation Age of Onset  . Diabetes    . Cancer Neg  Hx     Social History Social History  Substance Use Topics  . Smoking status: Former Smoker    Packs/day: 1.00    Types: Cigarettes    Quit date: 03/15/2011  . Smokeless tobacco: Never Used  . Alcohol use No     Comment: occ     Allergies   Alka-seltzer [aspirin effervescent] and Excedrin extra strength [aspirin-acetaminophen-caffeine]   Review of Systems Review of Systems  10 systems reviewed and found to be negative, except as noted in the  HPI.   Physical Exam Updated Vital Signs BP 137/79   Pulse 66   Temp 98.1 F (36.7 C) (Oral)   Resp 17   Wt 99.8 kg   SpO2 100%   BMI 30.68 kg/m   Physical Exam  Constitutional: He is oriented to person, place, and time. He appears well-developed and well-nourished. No distress.  HENT:  Head: Normocephalic and atraumatic.  Mouth/Throat: Oropharynx is clear and moist.  Eyes: Conjunctivae and EOM are normal. Pupils are equal, round, and reactive to light.  Neck: Normal range of motion.  Cardiovascular: Normal rate, regular rhythm and intact distal pulses.   Pulmonary/Chest: Effort normal and breath sounds normal.  Abdominal: Soft. There is no tenderness.  Musculoskeletal: Normal range of motion. He exhibits no tenderness or deformity.  No shortening or deformity, distally neurovascularly intact, no focal tenderness on the greater trochanter, patient can abductor.  Neurological: He is alert and oriented to person, place, and time.  Skin: He is not diaphoretic.  Psychiatric: He has a normal mood and affect.  Nursing note and vitals reviewed.    ED Treatments / Results  Labs (all labs ordered are listed, but only abnormal results are displayed) Labs Reviewed - No data to display  EKG  EKG Interpretation None       Radiology Ct Hip Right Wo Contrast  Result Date: 10/30/2015 CLINICAL DATA:  Right hip pain after straining onto while it. History of metastatic prostate cancer. Abnormal radiograph demonstrating sclerosis in the right ischium. EXAM: CT OF THE RIGHT HIP WITHOUT CONTRAST TECHNIQUE: Multidetector CT imaging of the right hip was performed according to the standard protocol. Multiplanar CT image reconstructions were also generated. COMPARISON:  10/30/2015 FINDINGS: Today' s exam included the right hip and adjacent pelvic region from the right acetabulum down. Exam was ordered as a hip CT rather than a bony pelvis CT and accordingly did not include the entire bony  pelvis. Bones/Joint/Cartilage Abnormal sclerosis in the right posterior ischium just anterior to the hamstring origination site, measuring 3.0 by 1.5 by 1.5 cm, no extraosseous extension or visible cortical breakthrough. Mild spurring of the right femoral head. Ligaments Suboptimally assessed by CT. Muscles and Tendons Unremarkable Soft tissues Fiducials along the posterolateral prostate margin. Minimal presacral stranding, chronic. Small indirect inguinal hernia. Right external iliac node 1.1 cm in short axis on image 4/3, previously 0.9 cm. Right inguinal lymph node 1.7 cm in short axis diameter on image 26/3, previously 1.8 cm. IMPRESSION: 1. Indistinct sclerotic lesion inferiorly in the right ischium, along the posterior margin of the right inferior pubic ramus, suspicious for a metastatic lesion. No cortical breakthrough. 2. Pathologic right external iliac and inguinal adenopathy. 3. Chronic presacral stranding. Electronically Signed   By: Van Clines M.D.   On: 10/30/2015 14:08   Dg Hip Unilat W Or Wo Pelvis 2-3 Views Right  Result Date: 10/30/2015 CLINICAL DATA:  Acute right hip pain radiating down to right foot after sitting on 12 lead.  Initial encounter. EXAM: DG HIP (WITH OR WITHOUT PELVIS) 2-3V RIGHT COMPARISON:  None. FINDINGS: There is no evidence of hip fracture or dislocation. There is no evidence of arthropathy. Subtle sclerosis in the right ischial tuberosity may be related to metastatic disease in this patient with prostate cancer and known bony metastatic involvement. Prostate seeds noted at the midline. IMPRESSION: 1. No acute bony abnormality. 2. Subtle sclerosis right ischial tuberosity, suspicious for metastatic disease. Electronically Signed   By: Misty Stanley M.D.   On: 10/30/2015 12:08    Procedures Procedures (including critical care time)  Medications Ordered in ED Medications  oxyCODONE-acetaminophen (PERCOCET/ROXICET) 5-325 MG per tablet 1 tablet (1 tablet Oral Given  10/30/15 1302)     Initial Impression / Assessment and Plan / ED Course  I have reviewed the triage vital signs and the nursing notes.  Pertinent labs & imaging results that were available during my care of the patient were reviewed by me and considered in my medical decision making (see chart for details).  Clinical Course    Vitals:   10/30/15 1113 10/30/15 1114 10/30/15 1427  BP: 141/89  137/79  Pulse: 75  66  Resp: 16  17  Temp: 98.1 F (36.7 C)    TempSrc: Oral    SpO2: 100%  100%  Weight:  99.8 kg     Medications  oxyCODONE-acetaminophen (PERCOCET/ROXICET) 5-325 MG per tablet 1 tablet (1 tablet Oral Given 10/30/15 1302)    Nio Guttilla is 55 y.o. male presenting with Right hip pain, seems that there is been a prodrome over the last several weeks that worsened significantly after patient sat down to use the restroom today. Patient is ambulatory however his pain is severe, states that he heard a pop at that time, patient is distally neurovascularly intact with good range of motion to the hip. Patient has active metastatic prostate cancer, x-ray shows a sclerotic lesion consistent with metastases. CT confirms this. They splinted the patient that he will need to follow with oncology for further management and possible biopsy. Patient verbalizes understanding, he will be given crutches and Percocet for pain control with had an extensive discussion of fall precautions and patient verbalizes understanding and teach back technique.  Evaluation does not show pathology that would require ongoing emergent intervention or inpatient treatment. Pt is hemodynamically stable and mentating appropriately. Discussed findings and plan with patient/guardian, who agrees with care plan. All questions answered. Return precautions discussed and outpatient follow up given.    Final Clinical Impressions(s) / ED Diagnoses   Final diagnoses:  Bone lesion  Right hip pain    New Prescriptions Discharge  Medication List as of 10/30/2015  2:48 PM    START taking these medications   Details  oxyCODONE-acetaminophen (PERCOCET/ROXICET) 5-325 MG tablet 1 to 2 tabs PO q6hrs  PRN for pain, Print         Monico Blitz, PA-C 10/30/15 Acacia Villas, MD 10/30/15 1702

## 2015-10-30 NOTE — ED Notes (Signed)
Pt ambulatory in room without difficulty  

## 2015-10-30 NOTE — ED Notes (Signed)
Bed: WA20 Expected date:  Expected time:  Means of arrival:  Comments: EMS-fall 

## 2015-10-30 NOTE — Discharge Instructions (Signed)
Take percocet for breakthrough pain, do not drink alcohol, drive, care for children or do other critical tasks while taking percocet.  Please be very careful not to fall! The pain medication and crutches puts you at risk for falls. Please rest as much as possible and try to not stay alone.

## 2015-10-30 NOTE — ED Notes (Signed)
Patient transported to CT 

## 2015-10-30 NOTE — ED Triage Notes (Addendum)
Per EMS, pt from home, c/o right hip pain after sitting on the toilet and feeling "like something was going to pop but didn't." Hx prostate cancer. Ambulatory with EMS.

## 2015-11-07 ENCOUNTER — Ambulatory Visit: Payer: Medicare Other | Admitting: *Deleted

## 2015-11-16 ENCOUNTER — Ambulatory Visit (HOSPITAL_BASED_OUTPATIENT_CLINIC_OR_DEPARTMENT_OTHER): Payer: Medicare Other | Admitting: Oncology

## 2015-11-16 ENCOUNTER — Other Ambulatory Visit (HOSPITAL_BASED_OUTPATIENT_CLINIC_OR_DEPARTMENT_OTHER): Payer: Medicare Other

## 2015-11-16 ENCOUNTER — Telehealth: Payer: Self-pay | Admitting: Oncology

## 2015-11-16 VITALS — BP 138/64 | HR 86 | Temp 98.7°F | Resp 17 | Ht 71.0 in | Wt 224.3 lb

## 2015-11-16 DIAGNOSIS — Z79899 Other long term (current) drug therapy: Secondary | ICD-10-CM

## 2015-11-16 DIAGNOSIS — C61 Malignant neoplasm of prostate: Secondary | ICD-10-CM

## 2015-11-16 DIAGNOSIS — M25551 Pain in right hip: Secondary | ICD-10-CM | POA: Diagnosis not present

## 2015-11-16 DIAGNOSIS — Z21 Asymptomatic human immunodeficiency virus [HIV] infection status: Secondary | ICD-10-CM | POA: Diagnosis not present

## 2015-11-16 DIAGNOSIS — C775 Secondary and unspecified malignant neoplasm of intrapelvic lymph nodes: Secondary | ICD-10-CM | POA: Diagnosis not present

## 2015-11-16 LAB — COMPREHENSIVE METABOLIC PANEL
ALT: 18 U/L (ref 0–55)
AST: 13 U/L (ref 5–34)
Albumin: 3.5 g/dL (ref 3.5–5.0)
Alkaline Phosphatase: 261 U/L — ABNORMAL HIGH (ref 40–150)
Anion Gap: 9 mEq/L (ref 3–11)
BUN: 11.3 mg/dL (ref 7.0–26.0)
CO2: 29 meq/L (ref 22–29)
CREATININE: 0.8 mg/dL (ref 0.7–1.3)
Calcium: 8.9 mg/dL (ref 8.4–10.4)
Chloride: 105 mEq/L (ref 98–109)
GLUCOSE: 111 mg/dL (ref 70–140)
Potassium: 3.7 mEq/L (ref 3.5–5.1)
SODIUM: 143 meq/L (ref 136–145)
TOTAL PROTEIN: 7.4 g/dL (ref 6.4–8.3)
Total Bilirubin: 0.66 mg/dL (ref 0.20–1.20)

## 2015-11-16 LAB — CBC WITH DIFFERENTIAL/PLATELET
BASO%: 1 % (ref 0.0–2.0)
Basophils Absolute: 0.1 10*3/uL (ref 0.0–0.1)
EOS%: 3.4 % (ref 0.0–7.0)
Eosinophils Absolute: 0.2 10*3/uL (ref 0.0–0.5)
HCT: 37.9 % — ABNORMAL LOW (ref 38.4–49.9)
HGB: 12.5 g/dL — ABNORMAL LOW (ref 13.0–17.1)
LYMPH%: 31.8 % (ref 14.0–49.0)
MCH: 29.1 pg (ref 27.2–33.4)
MCHC: 33 g/dL (ref 32.0–36.0)
MCV: 88.3 fL (ref 79.3–98.0)
MONO#: 0.6 10*3/uL (ref 0.1–0.9)
MONO%: 11.2 % (ref 0.0–14.0)
NEUT%: 52.6 % (ref 39.0–75.0)
NEUTROS ABS: 2.8 10*3/uL (ref 1.5–6.5)
Platelets: 193 10*3/uL (ref 140–400)
RBC: 4.29 10*6/uL (ref 4.20–5.82)
RDW: 14.7 % — ABNORMAL HIGH (ref 11.0–14.6)
WBC: 5.3 10*3/uL (ref 4.0–10.3)
lymph#: 1.7 10*3/uL (ref 0.9–3.3)

## 2015-11-16 NOTE — Progress Notes (Signed)
Hematology and Oncology Follow Up Visit  Teresa Cianciola [id number] Jul 11, 1960 55 y.o. 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 He was diagnosed in 2014 where he had a Gleason score 4+5 = 9 and PSA of 230. He had disease including and to the periaortic lymph nodes measuring 3 cm.   Prior Therapy: He was treated with Lupron therapy at the time of diagnosis and had an excellent response with PSA down to 0.97 back in August 2015.  His PSA did rise to 18.76 in April 2016 and most recently up to 30.85 on 10/19/2014. At that time he was started on Xtandi and tolerated it poorly. He reported muscle cramps and took it for one week only.  He had been off any treatment other than Lupron since that time. His most recent bone scan was done in May 2016 showed a questionable L2 sclerotic lesion although an MRI obtained at that time of the lumbar spine did not show any evidence of bony metastasis.  Current therapy: Zytiga for a total dose of 1000 mg daily started on 02/15/2015. His dose was changed to 500 mg starting on 08/30/2015. Tivicay and Descovy for his HIV. These were changed in December 2016 to prevent interactions with Zytiga.  Interim History: Mr. Quinley presents today for a follow-up visit. Since his last visit, he was seen in the emergency department for increased right hip pain. He was evaluated and had plain film x-rays which did not show any evidence of pathological fractures. He was prescribed a medication which she does not take at this time. His pain have resolved and is ambulating without any difficulties. He denied any falls, syncope or any increased other bone pain.   He continues to tolerate Zytiga at a lower dose of 500 mg daily without any new complications. He reports that his fatigue and tiredness is about the same. He denied  any GI toxicity such as nausea, vomiting or satiety. He continues to have hot hot flashes which has not changed dramatically.   He does not report any headaches, blurry vision, syncope or seizures. Does not report any fevers, chills, sweats or weight loss. Does not report any chest pain, palpitation orthopnea or leg edema. Does not report any cough, hemoptysis or hematemesis. He does not report any major changes in his bowel habits or satiety. Does not report any skeletal complaints of arthralgias or myalgias. Remaining review of systems unremarkable.   Medications: No changes in his medication noted by my review. Current Outpatient Prescriptions  Medication Sig Dispense Refill  . abiraterone Acetate (ZYTIGA) 250 MG tablet Take 2 tablets by mouth once daily. Take on empty stomach 1 hour before or 2 hours after a meal. 60 tablet 0  . dolutegravir (TIVICAY) 50 MG tablet Take 1 tablet (50 mg total) by mouth daily. 30 tablet 11  . emtricitabine-tenofovir AF (DESCOVY) 200-25 MG tablet Take 1 tablet by mouth daily. 30 tablet 11  . leuprolide (LUPRON) 30 MG injection Inject 30 mg into the muscle every 4 (four) months.    Marland Kitchen oxyCODONE-acetaminophen (PERCOCET/ROXICET) 5-325 MG tablet 1 to 2 tabs PO q6hrs  PRN for pain 15 tablet 0   No current facility-administered medications for this visit.      Allergies:  Allergies  Allergen Reactions  . Alka-Seltzer [Aspirin Effervescent] Other (See Comments)    asthma  . Excedrin Extra Strength [Aspirin-Acetaminophen-Caffeine] Other (  See Comments)    REACTION: asthma    Past Medical History, Surgical history, Social history, and Family History were reviewed and updated.   Physical Exam: Blood pressure 138/64, pulse 86, temperature 98.7 F (37.1 C), temperature source Oral, resp. rate 17, height 5\' 11"  (1.803 m), weight 224 lb 4.8 oz (101.7 kg), SpO2 100 %. ECOG: 1 General appearance: Well-appearing gentleman without distress. Head: Normocephalic,  without obvious abnormality no oral thrush noted. Neck: no adenopathy no thyroid masses. Lymph nodes: Cervical, supraclavicular, and axillary nodes normal. Heart:regular rate and rhythm, S1, S2 normal, no murmur, click, rub or gallop Lung:chest clear, no wheezing, rales, normal symmetric air entry Abdomin: soft, non-tender, without masses or organomegaly no ulcers or lesions. EXT:no erythema, induration, or nodules   Lab Results: Lab Results  Component Value Date   WBC 5.3 11/16/2015   HGB 12.5 (L) 11/16/2015   HCT 37.9 (L) 11/16/2015   MCV 88.3 11/16/2015   PLT 193 11/16/2015     Chemistry      Component Value Date/Time   NA 143 11/16/2015 1106   K 3.7 11/16/2015 1106   CL 106 10/05/2015 0922   CO2 29 11/16/2015 1106   BUN 11.3 11/16/2015 1106   CREATININE 0.8 11/16/2015 1106      Component Value Date/Time   CALCIUM 8.9 11/16/2015 1106   ALKPHOS 261 (H) 11/16/2015 1106   AST 13 11/16/2015 1106   ALT 18 11/16/2015 1106   BILITOT 0.66 11/16/2015 1106      Results for SRULY, BRESSI (MRN [id number]) as of 11/16/2015 11:32  Ref. Range 04/25/2015 08:48 07/12/2015 14:13 08/30/2015 12:26  PSA Latest Ref Range: 0.0 - 4.0 ng/mL 6.0 (H) 9.2 (H) 14.0 (H)   Impression and Plan:   55 year old gentleman with the following issues:  1. 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 His initial diagnosis back in June 2014 where he presented with a Gleason score 4+5 = 9 and a PSA of 230. After a period of androgen deprivation and a PSA nadir of less than 1, he started developing a rapid rise in his PSA and have been intolerant to Desloge.  CT scan on January 2017 was reviewed which showed pelvic adenopathy and potential 1.9 cm sclerotic lesion at the L2 vertebral body.  He was started on Zytiga but tolerated the full dose poorly and currently at 500 mg daily since June 2017. This regimen seems to have improved his overall quality of life and agreeable  to continue.   His PSA did did go up to 14 but his overall disease status remains stable. Clinically he remains stable and favors continuing Zytiga despite the rise in his PSA as of his overall reasonable improvement in his quality of life. The plan is to continue with the same dose and schedule and repeat PSA, bone scan in October 2017. Different salvage therapy will be considered he develops rapid progression of disease.   2. Androgen depravation: I have recommended continuing that indefinitely.  3. HIV: He was switched from Atripla toTivicay and Descovy by Dr. [name].   4. LFT monitoring: His liver function tests on 11/16/2015 was within normal range.  5. Electrolyte surveillance: His potassium and the rest of his electrolytes are within normal range.  6. Adrenal insufficiency: This will be monitored closely because of the potential problems associated with Zytiga. He has no signs or symptoms of that at this time.  7. Right hip pain: Plain film x-rays suggest potential of metastatic  disease to the hip which is not surprising. The plan is to obtain a bone scan for complete staging purposes before the next visit. His pain seems to be manageable at this time and is not taking any pain medication.  8. Follow-up: Will be in 6 weeks.   [facility], MD 9/15/201711:45 AM

## 2015-11-16 NOTE — Progress Notes (Signed)
Thanks so much. 

## 2015-11-16 NOTE — Telephone Encounter (Signed)
Avs report and schedule given to patient, per 11/16/15 los.

## 2015-11-17 LAB — PSA: Prostate Specific Ag, Serum: 18.2 ng/mL — ABNORMAL HIGH (ref 0.0–4.0)

## 2015-11-19 ENCOUNTER — Ambulatory Visit: Payer: Medicare Other | Admitting: *Deleted

## 2015-11-20 ENCOUNTER — Other Ambulatory Visit: Payer: Self-pay | Admitting: *Deleted

## 2015-11-20 ENCOUNTER — Encounter: Payer: Self-pay | Admitting: *Deleted

## 2015-11-20 DIAGNOSIS — C61 Malignant neoplasm of prostate: Secondary | ICD-10-CM

## 2015-11-20 MED ORDER — ABIRATERONE ACETATE 250 MG PO TABS: ORAL_TABLET | ORAL | 0 refills | Status: DC

## 2015-12-06 DIAGNOSIS — C61 Malignant neoplasm of prostate: Secondary | ICD-10-CM | POA: Diagnosis not present

## 2015-12-13 ENCOUNTER — Other Ambulatory Visit: Payer: Self-pay | Admitting: Urology

## 2015-12-13 DIAGNOSIS — C61 Malignant neoplasm of prostate: Secondary | ICD-10-CM | POA: Diagnosis not present

## 2015-12-18 ENCOUNTER — Encounter: Payer: Self-pay | Admitting: *Deleted

## 2015-12-19 ENCOUNTER — Other Ambulatory Visit: Payer: Self-pay | Admitting: *Deleted

## 2015-12-19 DIAGNOSIS — C61 Malignant neoplasm of prostate: Secondary | ICD-10-CM

## 2015-12-19 MED ORDER — ABIRATERONE ACETATE 250 MG PO TABS: 1000.0000 mg | ORAL_TABLET | Freq: Every day | ORAL | 0 refills | Status: DC

## 2015-12-21 ENCOUNTER — Telehealth: Payer: Self-pay | Admitting: Oncology

## 2015-12-21 NOTE — Telephone Encounter (Signed)
11/22 appointment rescheduled per patient request. The patient requested that both appointments be scheduled following the week of 11/03. The patient did not want to schedule a lab prior to the scan per he did not want anything before 11:45AM on 11/06. Lab refused until 11/22 appointment.

## 2015-12-25 ENCOUNTER — Other Ambulatory Visit: Payer: Medicare Other

## 2015-12-27 ENCOUNTER — Ambulatory Visit: Payer: Medicare Other | Admitting: Oncology

## 2016-01-03 ENCOUNTER — Encounter: Payer: Self-pay | Admitting: *Deleted

## 2016-01-03 NOTE — Progress Notes (Signed)
[place] Work  Clinical Social Work was referred by patient for assessment of psychosocial needs.  Clinical Social Worker was contacted by patient at [facility] about his [employer]. Pt reports the SCAT office contacted him and reports his SCAT will end 03/02/16. Pt would like to get this re-certified. CSW explained how to get SCAT re-certified either through [facility] or another medical provider that he sees regularly. Pt aware and stated understanding. He reports he plans to reach out an make an appointment in the near future to get his SCAT redone. Pt denied other needs and CSW will assist accordingly when pt ready to complete SCAT paperwork.    Clinical Social Work interventions: Resource education  Loren Racer, [place] Worker [place]  [place] Phone: [phone] Fax: [phone]

## 2016-01-05 ENCOUNTER — Other Ambulatory Visit: Payer: Self-pay | Admitting: Infectious Disease

## 2016-01-05 DIAGNOSIS — B20 Human immunodeficiency virus [HIV] disease: Secondary | ICD-10-CM

## 2016-01-07 ENCOUNTER — Encounter (HOSPITAL_COMMUNITY)
Admission: RE | Admit: 2016-01-07 | Discharge: 2016-01-07 | Disposition: A | Discharge status: Home / Self Care | Payer: Medicare Other | Source: Ambulatory Visit | Attending: Urology | Admitting: Urology

## 2016-01-07 DIAGNOSIS — C61 Malignant neoplasm of prostate: Secondary | ICD-10-CM

## 2016-01-07 MED ORDER — TECHNETIUM TC 99M MEDRONATE IV KIT
19.3000 | PACK | Freq: Once | INTRAVENOUS | Status: AC | PRN
  Administered 2016-01-07: 19.3 via INTRAVENOUS

## 2016-01-10 DIAGNOSIS — C61 Malignant neoplasm of prostate: Secondary | ICD-10-CM | POA: Diagnosis not present

## 2016-01-18 ENCOUNTER — Other Ambulatory Visit: Payer: Self-pay | Admitting: *Deleted

## 2016-01-18 DIAGNOSIS — C61 Malignant neoplasm of prostate: Secondary | ICD-10-CM

## 2016-01-18 MED ORDER — ABIRATERONE ACETATE 250 MG PO TABS: 1000.0000 mg | ORAL_TABLET | Freq: Every day | ORAL | 0 refills | Status: DC

## 2016-01-23 ENCOUNTER — Encounter: Payer: Self-pay | Admitting: *Deleted

## 2016-01-23 ENCOUNTER — Ambulatory Visit (HOSPITAL_BASED_OUTPATIENT_CLINIC_OR_DEPARTMENT_OTHER): Payer: Medicare Other | Admitting: Oncology

## 2016-01-23 ENCOUNTER — Telehealth: Payer: Self-pay | Admitting: Oncology

## 2016-01-23 ENCOUNTER — Other Ambulatory Visit (HOSPITAL_BASED_OUTPATIENT_CLINIC_OR_DEPARTMENT_OTHER): Payer: Medicare Other

## 2016-01-23 VITALS — BP 163/86 | HR 86 | Temp 99.1°F | Resp 18 | Wt 220.6 lb

## 2016-01-23 DIAGNOSIS — M8588 Other specified disorders of bone density and structure, other site: Secondary | ICD-10-CM | POA: Diagnosis not present

## 2016-01-23 DIAGNOSIS — C61 Malignant neoplasm of prostate: Secondary | ICD-10-CM

## 2016-01-23 DIAGNOSIS — C775 Secondary and unspecified malignant neoplasm of intrapelvic lymph nodes: Secondary | ICD-10-CM | POA: Diagnosis not present

## 2016-01-23 DIAGNOSIS — B20 Human immunodeficiency virus [HIV] disease: Secondary | ICD-10-CM

## 2016-01-23 LAB — COMPREHENSIVE METABOLIC PANEL
ALT: 13 U/L (ref 0–55)
ANION GAP: 9 meq/L (ref 3–11)
AST: 13 U/L (ref 5–34)
Albumin: 3.7 g/dL (ref 3.5–5.0)
Alkaline Phosphatase: 294 U/L — ABNORMAL HIGH (ref 40–150)
BILIRUBIN TOTAL: 0.41 mg/dL (ref 0.20–1.20)
BUN: 13.4 mg/dL (ref 7.0–26.0)
CALCIUM: 9.6 mg/dL (ref 8.4–10.4)
CHLORIDE: 104 meq/L (ref 98–109)
CO2: 28 meq/L (ref 22–29)
Creatinine: 0.9 mg/dL (ref 0.7–1.3)
Glucose: 183 mg/dl — ABNORMAL HIGH (ref 70–140)
Potassium: 4 mEq/L (ref 3.5–5.1)
Sodium: 142 mEq/L (ref 136–145)
TOTAL PROTEIN: 8 g/dL (ref 6.4–8.3)

## 2016-01-23 LAB — CBC WITH DIFFERENTIAL/PLATELET
BASO%: 0 % (ref 0.0–2.0)
BASOS ABS: 0 10*3/uL (ref 0.0–0.1)
EOS ABS: 0.1 10*3/uL (ref 0.0–0.5)
EOS%: 2.6 % (ref 0.0–7.0)
HEMATOCRIT: 39.3 % (ref 38.4–49.9)
HGB: 13.3 g/dL (ref 13.0–17.1)
LYMPH%: 39.5 % (ref 14.0–49.0)
MCH: 29.6 pg (ref 27.2–33.4)
MCHC: 33.8 g/dL (ref 32.0–36.0)
MCV: 87.5 fL (ref 79.3–98.0)
MONO#: 0.5 10*3/uL (ref 0.1–0.9)
MONO%: 9.9 % (ref 0.0–14.0)
NEUT#: 2.4 10*3/uL (ref 1.5–6.5)
NEUT%: 48 % (ref 39.0–75.0)
PLATELETS: 213 10*3/uL (ref 140–400)
RBC: 4.49 10*6/uL (ref 4.20–5.82)
RDW: 14 % (ref 11.0–14.6)
WBC: 4.9 10*3/uL (ref 4.0–10.3)
lymph#: 2 10*3/uL (ref 0.9–3.3)

## 2016-01-23 NOTE — Telephone Encounter (Signed)
Appointments scheduled per 01/23/16 los. A copy of the AVS report and appointment schedule was given to the patient, per 01/23/16 los. °

## 2016-01-23 NOTE — Progress Notes (Signed)
[place] Work  Clinical Social Work met with pt at [facility] to complete SCAT application. CSW submitted SCAT application to SCAT office via fax as requested by pt. No other needs noted per pt.   Clinical Social Work interventions:  Resource assistance  Loren Racer, [place] Social Worker [facility]  [place] Phone: [phone] Fax: [phone] '

## 2016-01-23 NOTE — Progress Notes (Signed)
Hematology and Oncology Follow Up Visit  Dean Carlson [id number] 02-24-1961 55 y.o. 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 He was diagnosed in 2014 where he had a Gleason score 4+5 = 9 and PSA of 230. He had disease including and to the periaortic lymph nodes measuring 3 cm.   Prior Therapy: He was treated with Lupron therapy at the time of diagnosis and had an excellent response with PSA down to 0.97 back in August 2015.  His PSA did rise to 18.76 in April 2016 and most recently up to 30.85 on 10/19/2014. At that time he was started on Xtandi and tolerated it poorly. He reported muscle cramps and took it for one week only.  He had been off any treatment other than Lupron since that time. His most recent bone scan was done in May 2016 showed a questionable L2 sclerotic lesion although an MRI obtained at that time of the lumbar spine did not show any evidence of bony metastasis.  Current therapy: Zytiga for a total dose of 1000 mg daily started on 02/15/2015. His dose was changed to 500 mg starting on 08/30/2015. Tivicay and Descovy for his HIV. These were changed in December 2016 to prevent interactions with Zytiga.  Interim History: Dean Carlson presents today for a follow-up visit. Since his last visit, he reports no major changes in his health. He does not report any joint pain or bone pain that is new since the last visit. He does report hip pain which is unchanged since last visit. Despite his pain, he ambulates without any difficulties and has not reported any falls or syncope. He continues to attend to her activities of daily living without any decline.  He continues to tolerate Zytiga at a lower dose of 500 mg daily without any new complications. He reports that his fatigue and tiredness is about the same. He denied any GI  toxicity such as nausea, vomiting or satiety. He reports his quality of life has not dramatically changed.   He does not report any headaches, blurry vision, syncope or seizures. Does not report any fevers, chills, sweats or weight loss. Does not report any chest pain, palpitation orthopnea or leg edema. Does not report any cough, hemoptysis or hematemesis. He does not report any major changes in his bowel habits or satiety. Does not report any skeletal complaints of arthralgias or myalgias. Remaining review of systems unremarkable.   Medications: No changes in his medication noted by my review. Current Outpatient Prescriptions  Medication Sig Dispense Refill  . abiraterone Acetate (ZYTIGA) 250 MG tablet Take 4 tablets (1,000 mg total) by mouth daily. Take 2 tablets by mouth once daily. Take on empty stomach 1 hour before or 2 hours after a meal. 60 tablet 0  . DESCOVY 200-25 MG tablet TAKE 1 TABLET BY MOUTH DAILY 30 tablet 5  . leuprolide (LUPRON) 30 MG injection Inject 30 mg into the muscle every 4 (four) months.    Marland Kitchen oxyCODONE-acetaminophen (PERCOCET/ROXICET) 5-325 MG tablet 1 to 2 tabs PO q6hrs  PRN for pain 15 tablet 0  . TIVICAY 50 MG tablet TAKE 1 TABLET BY MOUTH EVERY DAY 30 tablet 5   No current facility-administered medications for this visit.      Allergies:  Allergies  Allergen Reactions  . Alka-Seltzer [Aspirin Effervescent] Other (See Comments)    asthma  . Excedrin Extra  Strength [Aspirin-Acetaminophen-Caffeine] Other (See Comments)    REACTION: asthma    Past Medical History, Surgical history, Social history, and Family History were reviewed and updated.   Physical Exam: Blood pressure (!) 163/86, pulse 86, temperature 99.1 F (37.3 C), temperature source Oral, resp. rate 18, weight 220 lb 9.6 oz (100.1 kg), SpO2 100 %. ECOG: 1 General appearance: Alert, awake gentleman without distress. Head: Normocephalic, without obvious abnormality no oral ulcers or  lesions. Neck: no adenopathy no thyroid masses. Lymph nodes: Cervical, supraclavicular, and axillary nodes normal. Heart:regular rate and rhythm, S1, S2 normal, no murmur, click, rub or gallop Lung:chest clear, no wheezing, rales, normal symmetric air entry Abdomin: soft, non-tender, without masses or organomegaly no shifting dullness or ascites. EXT:no erythema, induration, or nodules   Lab Results: Lab Results  Component Value Date   WBC 4.9 01/23/2016   HGB 13.3 01/23/2016   HCT 39.3 01/23/2016   MCV 87.5 01/23/2016   PLT 213 01/23/2016     Chemistry      Component Value Date/Time   NA 143 11/16/2015 1106   K 3.7 11/16/2015 1106   CL 106 10/05/2015 0922   CO2 29 11/16/2015 1106   BUN 11.3 11/16/2015 1106   CREATININE 0.8 11/16/2015 1106      Component Value Date/Time   CALCIUM 8.9 11/16/2015 1106   ALKPHOS 261 (H) 11/16/2015 1106   AST 13 11/16/2015 1106   ALT 18 11/16/2015 1106   BILITOT 0.66 11/16/2015 1106     Results for Dean Carlson, Dean Carlson (MRN [id number]) as of 01/23/2016 09:44  Ref. Range 07/12/2015 14:13 08/30/2015 12:26 11/16/2015 11:06  PSA Latest Ref Range: 0.0 - 4.0 ng/mL 9.2 (H) 14.0 (H) 18.2 (H)    IMPRESSION: 1. Increased activity noted over the right parietal, left temporal, left occipital skull.  2.  Increased activity noted over the right ischium.  These findings are suspicious for metastatic disease. A skull series and pelvic series can be obtained to further evaluate.  Impression and Plan:   55 year old gentleman with the following issues:  1. 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 His initial diagnosis back in June 2014 where he presented with a Gleason score 4+5 = 9 and a PSA of 230. After a period of androgen deprivation and a PSA nadir of less than 1, he started developing a rapid rise in his PSA and have been intolerant to [place].  CT scan on January 2017 was reviewed which showed pelvic  adenopathy and potential 1.9 cm sclerotic lesion at the L2 vertebral body.  He was started on Zytiga but tolerated the full dose poorly and currently at 500 mg daily since June 2017.   His PSA continues to be on a slow rise low he is minimally symptomatic at this time. We have discussed the risks and benefits of continuing Zytiga and is agreeable to continue for now. Different salvage regimen will be utilized if he develops rapid progression of disease.  2. Androgen depravation: I have recommended continuing that indefinitely.  3. HIV: He was switched from Atripla toTivicay and Descovy by Dr. [name].   4. LFT monitoring: His liver function tests on 11/16/2015 was within normal range. This will be repeated today.  5. Electrolyte surveillance: His potassium and the rest of his electrolytes are within normal range.  6. Adrenal insufficiency: This will be monitored closely because of the potential problems associated with Zytiga. He has no signs or symptoms of that at this time.  7.  Right hip pain: Bone scan obtained on 01/07/2016 showed increased activity around the right issue. Palliative radiation therapy will be considered if his pain becomes an issue. His pain appears to be manageable at this time.  8. Follow-up: Will be in 6 weeks.   [facility], MD 11/22/20179:42 AM

## 2016-01-24 LAB — PSA: PROSTATE SPECIFIC AG, SERUM: 34.3 ng/mL — AB (ref 0.0–4.0)

## 2016-02-19 ENCOUNTER — Other Ambulatory Visit: Payer: Self-pay | Admitting: *Deleted

## 2016-02-19 DIAGNOSIS — C61 Malignant neoplasm of prostate: Secondary | ICD-10-CM

## 2016-02-19 MED ORDER — ABIRATERONE ACETATE 250 MG PO TABS: 1000.0000 mg | ORAL_TABLET | Freq: Every day | ORAL | 0 refills | Status: DC

## 2016-03-06 ENCOUNTER — Encounter: Payer: Self-pay | Admitting: Radiation Oncology

## 2016-03-06 ENCOUNTER — Ambulatory Visit (HOSPITAL_BASED_OUTPATIENT_CLINIC_OR_DEPARTMENT_OTHER): Payer: Medicare Other | Admitting: Oncology

## 2016-03-06 ENCOUNTER — Other Ambulatory Visit (HOSPITAL_BASED_OUTPATIENT_CLINIC_OR_DEPARTMENT_OTHER): Payer: Medicare Other

## 2016-03-06 ENCOUNTER — Telehealth: Payer: Self-pay | Admitting: Oncology

## 2016-03-06 ENCOUNTER — Other Ambulatory Visit: Payer: Self-pay | Admitting: *Deleted

## 2016-03-06 VITALS — BP 140/80 | HR 82 | Temp 99.1°F | Resp 18 | Ht 71.0 in | Wt 229.5 lb

## 2016-03-06 DIAGNOSIS — C61 Malignant neoplasm of prostate: Secondary | ICD-10-CM

## 2016-03-06 DIAGNOSIS — C775 Secondary and unspecified malignant neoplasm of intrapelvic lymph nodes: Secondary | ICD-10-CM

## 2016-03-06 DIAGNOSIS — B20 Human immunodeficiency virus [HIV] disease: Secondary | ICD-10-CM | POA: Diagnosis not present

## 2016-03-06 LAB — COMPREHENSIVE METABOLIC PANEL
ALT: 15 U/L (ref 0–55)
AST: 14 U/L (ref 5–34)
Albumin: 3.8 g/dL (ref 3.5–5.0)
Alkaline Phosphatase: 308 U/L — ABNORMAL HIGH (ref 40–150)
Anion Gap: 9 mEq/L (ref 3–11)
BILIRUBIN TOTAL: 0.31 mg/dL (ref 0.20–1.20)
BUN: 12.2 mg/dL (ref 7.0–26.0)
CALCIUM: 9.4 mg/dL (ref 8.4–10.4)
CO2: 29 mEq/L (ref 22–29)
CREATININE: 0.8 mg/dL (ref 0.7–1.3)
Chloride: 105 mEq/L (ref 98–109)
EGFR: >90 mL/min/{1.73_m2} (ref >90)
Glucose: 103 mg/dl (ref 70–140)
Potassium: 3.7 mEq/L (ref 3.5–5.1)
Sodium: 143 mEq/L (ref 136–145)
TOTAL PROTEIN: 7.4 g/dL (ref 6.4–8.3)

## 2016-03-06 LAB — CBC WITH DIFFERENTIAL/PLATELET
BASO%: 0.5 % (ref 0.0–2.0)
Basophils Absolute: 0 10*3/uL (ref 0.0–0.1)
EOS ABS: 0.1 10*3/uL (ref 0.0–0.5)
EOS%: 2.6 % (ref 0.0–7.0)
HEMATOCRIT: 38.1 % — AB (ref 38.4–49.9)
HGB: 12.6 g/dL — ABNORMAL LOW (ref 13.0–17.1)
LYMPH#: 1.6 10*3/uL (ref 0.9–3.3)
LYMPH%: 27.7 % (ref 14.0–49.0)
MCH: 29.7 pg (ref 27.2–33.4)
MCHC: 33.1 g/dL (ref 32.0–36.0)
MCV: 89.8 fL (ref 79.3–98.0)
MONO#: 0.7 10*3/uL (ref 0.1–0.9)
MONO%: 13.2 % (ref 0.0–14.0)
NEUT#: 3.1 10*3/uL (ref 1.5–6.5)
NEUT%: 56 % (ref 39.0–75.0)
PLATELETS: 212 10*3/uL (ref 140–400)
RBC: 4.25 10*6/uL (ref 4.20–5.82)
RDW: 14.4 % (ref 11.0–14.6)
WBC: 5.6 10*3/uL (ref 4.0–10.3)

## 2016-03-06 MED ORDER — OXYCODONE-ACETAMINOPHEN 5-325 MG PO TABS: 1.0000 | ORAL_TABLET | Freq: Four times a day (QID) | ORAL | 0 refills | Status: DC | PRN

## 2016-03-06 NOTE — Progress Notes (Signed)
Hematology and Oncology Follow Up Visit  Dean Carlson [id number] May 27, 1960 56 y.o. 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 He was diagnosed in 2014 where he had a Gleason score 4+5 = 9 and PSA of 230. He had disease including and to the periaortic lymph nodes measuring 3 cm.   Prior Therapy: He was treated with Lupron therapy at the time of diagnosis and had an excellent response with PSA down to 0.97 back in August 2015.  His PSA did rise to 18.76 in April 2016 and most recently up to 30.85 on 10/19/2014. At that time he was started on Xtandi and tolerated it poorly. He reported muscle cramps and took it for one week only.  He had been off any treatment other than Lupron since that time. His most recent bone scan was done in May 2016 showed a questionable L2 sclerotic lesion although an MRI obtained at that time of the lumbar spine did not show any evidence of bony metastasis.  Current therapy: Zytiga for a total dose of 1000 mg daily started on 02/15/2015. His dose was changed to 500 mg starting on 08/30/2015. Tivicay and Descovy for his HIV. These were changed in December 2016 to prevent interactions with Zytiga.  Interim History: Dean Carlson presents today for a follow-up visit. Since his last visit, he reports no recent complaints. He continues to have issues with his right hip specifically when he is sitting down. He does take oxycodone with acetaminophen periodically which helps his pain. He is trying to avoid taking this pain medication for fear of addiction and only uses it if he develops severe pain. Despite his pain, he reports no falls or pathological fractures. Has not reported any decline in his performance status her quality of life. He continues to take Zytiga and has not reported any recent complications related to  that.  He had any headaches or any neurological symptoms. His appetite remained excellent and not lost any weight. Performance status remained maintained at this time.  He does not report any headaches, blurry vision, syncope or seizures. Does not report any fevers, chills, sweats or weight loss. Does not report any chest pain, palpitation orthopnea or leg edema. Does not report any cough, hemoptysis or hematemesis. He does not report any major changes in his bowel habits or satiety. Does not report any skeletal complaints of arthralgias or myalgias. Remaining review of systems unremarkable.   Medications: No changes in his medication noted by my review. Current Outpatient Prescriptions  Medication Sig Dispense Refill  . abiraterone Acetate (ZYTIGA) 250 MG tablet Take 4 tablets (1,000 mg total) by mouth daily. Take 2 tablets by mouth once daily. Take on empty stomach 1 hour before or 2 hours after a meal. 60 tablet 0  . DESCOVY 200-25 MG tablet TAKE 1 TABLET BY MOUTH DAILY 30 tablet 5  . leuprolide (LUPRON) 30 MG injection Inject 30 mg into the muscle every 4 (four) months.    Marland Kitchen oxyCODONE-acetaminophen (PERCOCET/ROXICET) 5-325 MG tablet Take 1-2 tablets by mouth every 6 (six) hours as needed for severe pain. 1 to 2 tabs PO q6hrs  PRN for pain 60 tablet 0  . TIVICAY 50 MG tablet TAKE 1 TABLET BY MOUTH EVERY DAY 30 tablet 5   No current facility-administered medications for this visit.      Allergies:  Allergies  Allergen Reactions  .  Alka-Seltzer [Aspirin Effervescent] Other (See Comments)    asthma  . Excedrin Extra Strength [Aspirin-Acetaminophen-Caffeine] Other (See Comments)    REACTION: asthma    Past Medical History, Surgical history, Social history, and Family History were reviewed and updated.   Physical Exam: Blood pressure 140/80, pulse 82, temperature 99.1 F (37.3 C), temperature source Oral, resp. rate 18, height 5\' 11"  (1.803 m), weight 229 lb 8 oz (104.1 kg), SpO2 100  %. ECOG: 1 General appearance: Well-appearing gentleman without distress. Head: Normocephalic, without obvious abnormality no oral ulcers or lesions. Neck: no adenopathy no thyroid masses. Lymph nodes: Cervical, supraclavicular, and axillary nodes normal. Heart:regular rate and rhythm, S1, S2 normal, no murmur, click, rub or gallop Lung:chest clear, no wheezing, rales, normal symmetric air entry Abdomin: soft, non-tender, without masses or organomegaly no rebound or guarding. EXT:no erythema, induration, or nodules   Lab Results: Lab Results  Component Value Date   WBC 5.6 03/06/2016   HGB 12.6 (L) 03/06/2016   HCT 38.1 (L) 03/06/2016   MCV 89.8 03/06/2016   PLT 212 03/06/2016     Chemistry      Component Value Date/Time   NA 143 03/06/2016 1015   K 3.7 03/06/2016 1015   CL 106 10/05/2015 0922   CO2 29 03/06/2016 1015   BUN 12.2 03/06/2016 1015   CREATININE 0.8 03/06/2016 1015      Component Value Date/Time   CALCIUM 9.4 03/06/2016 1015   ALKPHOS 308 (H) 03/06/2016 1015   AST 14 03/06/2016 1015   ALT 15 03/06/2016 1015   BILITOT 0.31 03/06/2016 1015     Results for Dean Carlson, Dean Carlson (MRN [id number]) as of 03/06/2016 10:38  Ref. Range 08/30/2015 12:26 11/16/2015 11:06 01/23/2016 09:03  PSA Latest Ref Range: 0.0 - 4.0 ng/mL 14.0 (H) 18.2 (H) 34.3 (H)     IMPRESSION: 1. Increased activity noted over the right parietal, left temporal, left occipital skull.  2.  Increased activity noted over the right ischium.  These findings are suspicious for metastatic disease. A skull series and pelvic series can be obtained to further evaluate.  Impression and Plan:   56 year old gentleman with the following issues:  1. 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 His initial diagnosis back in June 2014 where he presented with a Gleason score 4+5 = 9 and a PSA of 230. After a period of androgen deprivation and a PSA nadir of less than 1,  he started developing a rapid rise in his PSA and have been intolerant to [place].  CT scan on January 2017 was reviewed which showed pelvic adenopathy and potential 1.9 cm sclerotic lesion at the L2 vertebral body.  He was started on Zytiga but tolerated the full dose poorly. He is  currently at 500 mg daily since June 2017.   His PSA and bone scan were reviewed again and discussed with the patient today. Imaging studies were also shared with the patient today. His PSA is now doubling rather rapidly indicating very little to no response to Zytiga at this time.  Options of therapy were reviewed today which include different hormonal agent such as Xtandi versus systemic chemotherapy. Risks and benefits of both of those approaches were reviewed today and he declined systemic chemotherapy. He is willing to try Xtandi at this time if no contraindication with his HIV medication.  2. Androgen depravation: I have recommended continuing that indefinitely.  3. HIV: He was switched from Atripla toTivicay and Descovy by Dr. Tommy Carlson. We  will discuss switching him to [facility] to ensure no drug interaction.  4. LFT monitoring: His liver function tests on 11/16/2015 was within normal range. This will be repeated today.  5. Electrolyte surveillance: His potassium and the rest of his electrolytes are within normal range.  6. Adrenal insufficiency: No sinus symptoms to suggest that.  7. Right hip pain: Bone scan obtained on 01/07/2016 showed increased activity around the right issue. I have recommended palliative radiation to the right hip given his persistent pain. I will refer him to Dr. Tammi Klippel who treated him previously for his prostate cancer.  8. Follow-up: Will be in 6 weeks.   [facility], MD 1/4/201810:57 AM

## 2016-03-06 NOTE — Telephone Encounter (Signed)
Appointments scheduled per 1/4 LOS. Patient given AVS report and calendars with future scheduled appointments. Patient scheduled for visit with Dr. Tammi Klippel on 2/5 per his request to have appointment following 2/3 per insurance.

## 2016-03-07 LAB — PSA: Prostate Specific Ag, Serum: 37.5 ng/mL — ABNORMAL HIGH (ref 0.0–4.0)

## 2016-03-13 ENCOUNTER — Telehealth: Payer: Self-pay | Admitting: *Deleted

## 2016-03-13 NOTE — Progress Notes (Signed)
Minh thanks for picking this up. THe lexicomp did not so glad you found it!

## 2016-03-13 NOTE — Telephone Encounter (Signed)
Patient called and stated,"I had dark bloody urine this morning and now it has gone. Do I need to do anything? Also, when am I suppose to start [facility]?" Per Dr. Alen Blew, we are waiting on Dr. Derek Mound response whether Dean Carlson interferes with his HIV medications. Instructed patient to increase his fluids and monitor his urine. If the blood comes back, he is to call [facility]. Patient verbalized understanding.

## 2016-03-13 NOTE — Telephone Encounter (Signed)
Patient called, reporting frank blood when he urinates. This occurred last night. He left a message with Dr. Hazeline Junker office as well. He does not feel he can go to the ED Patient with decreased appetite, "feels horrible", cannot stay awake to eat food, has a "rough, bad cough." He is having increased pain in his buttock/hip.  He is calling the [facility] to make them aware, also to ask about counseling, as he is very scared and upset after his last visit.  He is calling [facility] 1) to make sure that Dr [name] knows Dr Alen Blew is looking for potential drug interactions with his new medication, 2) to update Dr [name] on his status, 3) for reassurance. RN encouraged patient. He will keep his appointment with [place] 2/5.  Landis Gandy, RN

## 2016-03-13 NOTE — Telephone Encounter (Signed)
We were looking at his ARV and new cancer drug therapy and I do not think there is any drug interaction. [place], [place] can you triple confirm. Sounds like pt needs to come to ED perhaps via ambulance if feeling this poorly

## 2016-03-14 ENCOUNTER — Other Ambulatory Visit: Payer: Self-pay | Admitting: Oncology

## 2016-03-14 ENCOUNTER — Telehealth: Payer: Self-pay | Admitting: Pharmacist Clinician (PhC)/ Clinical Pharmacy Specialist

## 2016-03-14 MED ORDER — ENZALUTAMIDE 40 MG PO CAPS: 160.0000 mg | ORAL_CAPSULE | Freq: Every day | ORAL | 0 refills | Status: DC

## 2016-03-14 NOTE — Telephone Encounter (Signed)
Thanks Minh! 

## 2016-03-14 NOTE — Telephone Encounter (Signed)
Called Dean Carlson today to bring him in on Monday to change his HIV meds to Triumeq + Tivicay due to interaction with his Xtandi. HLA neg. Genotype in 2009 showed wild type. D/w case with Dr [name].

## 2016-03-17 ENCOUNTER — Ambulatory Visit (INDEPENDENT_AMBULATORY_CARE_PROVIDER_SITE_OTHER): Payer: Medicare Other | Admitting: Pharmacist Clinician (PhC)/ Clinical Pharmacy Specialist

## 2016-03-17 DIAGNOSIS — B20 Human immunodeficiency virus [HIV] disease: Secondary | ICD-10-CM

## 2016-03-17 MED ORDER — DOLUTEGRAVIR SODIUM 50 MG PO TABS: 50.0000 mg | ORAL_TABLET | Freq: Every day | ORAL | 11 refills | Status: DC

## 2016-03-17 MED ORDER — ABACAVIR-DOLUTEGRAVIR-LAMIVUD 600-50-300 MG PO TABS: 1.0000 | ORAL_TABLET | Freq: Every day | ORAL | 11 refills | Status: DC

## 2016-03-17 MED FILL — TRIUMEQ 600-50-300 MG TABS: 600-50-300 | 30 days supply | Qty: 30 | Fill #0

## 2016-03-17 NOTE — Progress Notes (Signed)
HPI: Madoc Holquin is a 56 y.o. male who is here to change his ART due to chemo.   Allergies: Allergies  Allergen Reactions  . Alka-Seltzer [Aspirin Effervescent] Other (See Comments)    asthma  . Excedrin Extra Strength [Aspirin-Acetaminophen-Caffeine] Other (See Comments)    REACTION: asthma    Vitals:    Past Medical History: Past Medical History:  Diagnosis Date  . Adjustment disorder with mixed anxiety and depressed mood 05/09/2015  . Asthma    as a child  . Dizziness 05/09/2015  . Grief 05/09/2015  . HIV (human immunodeficiency virus infection) ([place])   . Insomnia 05/09/2015  . Prostate cancer ([place])   . Weight gain 05/09/2015    Social History: Social History   Social History  . Marital status: Legally Separated    Spouse name: N/A  . Number of children: N/A  . Years of education: N/A   Occupational History  . permanant disability    Social History Main Topics  . Smoking status: Former Smoker    Packs/day: 1.00    Types: Cigarettes    Quit date: 03/15/2011  . Smokeless tobacco: Never Used  . Alcohol use No     Comment: occ  . Drug use: No  . Sexual activity: Not Currently   Other Topics Concern  . Not on file   Social History Narrative  . No narrative on file    Previous Regimen:   Current Regimen: DTG/Descovy  Labs: HIV 1 RNA Quant (copies/mL)  Date Value  10/05/2015 <20  04/09/2015 <20  11/08/2014 <20   CD4 T Cell Abs (/uL)  Date Value  10/05/2015 580  04/09/2015 420  11/08/2014 400   Hep B S Ab (no units)  Date Value  04/27/2006 No   Hepatitis B Surface Ag (no units)  Date Value  04/27/2006 No   HCV Ab (no units)  Date Value  12/21/2013 NEGATIVE    CrCl: Estimated Creatinine Clearance: 125.1 mL/min (by C-G formula based on SCr of 0.8 mg/dL).  Lipids:    Component Value Date/Time   CHOL 130 04/09/2015 1227   TRIG 124 04/09/2015 1227   HDL 40 04/09/2015 1227   CHOLHDL 3.3 04/09/2015 1227   VLDL 25 04/09/2015 1227   LDLCALC  65 04/09/2015 1227    Assessment: Somnang is here for his pharmacy visit so we can change his ART due to his upcoming chemo. He is going to be on Xtandi very soon for his prostate CA. That medication is a pretty potent inducer of the 3A enzymes. He has been very well controlled on his current regimen. Therefore, we are changing him to Triumeq + DTG so that he could get BID DTG. His genotype in 2009 didn't show resistance mutations. He is HLA neg. He got a f/u appt with Dr. [name] in late Feb so it'd be perfect to do a VL at that time on the new regimen. Both scripts will be sent to [facility] and will be mailed due to his transportation.   Recommendations:  Stop Descovy Start Triumeq 1 PO qAM Cont DTG 26m PO qPM Made him a calendar  MOnnie Boer PharmD, BCPS, AAHIVP, CPP [facility] 03/17/2016, 10:11 AM

## 2016-03-17 NOTE — Patient Instructions (Signed)
Stop taking Descovy Start taking Triumeq 1 tab every morning Start taking the Tivicay 50mg  at night

## 2016-03-18 ENCOUNTER — Telehealth: Payer: Self-pay | Admitting: Pharmacist

## 2016-03-18 NOTE — Telephone Encounter (Signed)
Oral Chemotherapy Pharmacist Encounter  Received new prescription for [facility] for patient. Labs from 03/06/16 reviewed, OK for treatment  Current medication list in Epic assessed, no DDIs with [name] identified (even with HIV medications).  Prescription will be sent to [facility] for benefits analysis.  Oral Oncology Clinic will continue to follow.  Johny Drilling, PharmD, BCPS, BCOP 03/18/2016  9:46 AM Oral Oncology Clinic [phone]

## 2016-03-21 ENCOUNTER — Telehealth: Payer: Self-pay

## 2016-03-21 NOTE — Telephone Encounter (Signed)
PA sent to [facility] Approved 12/22/15 to 03/21/2019 through insurance. Copay at [facility] is $3.70  They will contact patient when drug arrives.  Thank you  [facility]

## 2016-03-21 NOTE — Telephone Encounter (Signed)
..  Oral Chemotherapy Follow-Up Form   Received PA notification for Xtandi from [facility].  Entered PA thru CoverMyMeds.  Received notification RX Xtandi approved from 12/22/15 thru 03/21/2019.  Faxed approval to [facility] with notification to let us know the copay status.  Will follow-up with [facility].  Thank you  Henreitta Leber, PharmD

## 2016-03-22 ENCOUNTER — Telehealth: Payer: Self-pay | Admitting: *Deleted

## 2016-03-22 NOTE — Telephone Encounter (Signed)
Patient called to say that he received his medication arranged by [facility]. He has not started it as he has some questions and wants Minh to call. Advised will send him the message and have him call as soon as he can.

## 2016-03-24 ENCOUNTER — Telehealth: Payer: Self-pay | Admitting: Pharmacist Clinician (PhC)/ Clinical Pharmacy Specialist

## 2016-03-24 NOTE — Telephone Encounter (Signed)
Called Theresa back to answer his questions about his HIV meds. He said that he got a little bit of nausea from the new med (Triumeq). Asked him about how he has been taking it. He said that he take a brown and a yellow one in the morning and a yellow one at night. I specifically told him to take the brown one in the morning and one yellow at night. He has only taken this for 1 dose only so this might have caused some nausea. He is going to start the correct way to take it tomorrow.

## 2016-03-25 ENCOUNTER — Encounter: Payer: Self-pay | Admitting: Oncology

## 2016-03-25 NOTE — Progress Notes (Signed)
Patient called to inquire about applying for the [place] to assist with his co-pay for medication. Patient wanted the grant application to be mailed to him. Explained to patient proof of his income would be needed to apply. After a very lenghthy discussion with patient, I advised him that he can apply for the grant when he comes on 04/07/16 in person and bring his documents on that day. I also advised him that if approved, the funds would be available that day and he would be able to pick up medication from the pharmacy on that same day. Patient asked if he should call the pharmacy to let them know he would be picking up the medication on that day and I advised him yes. Patient has an appointment on 04/07/16 in [place]. I advised him that he may see Jenny Reichmann in [place] on that day with his documents and may apply for the grant with her. I am Dairl Ponder to advise her that he will be asking for her down in [place]. He states he thinks he has her card. Patient wants his medication mailed to him going forward. Advised patient that he would need to make those arrangements with the pharmacy when he picks up his prescription.   Advised patient to contact me should he have any additional financial questions or concerns.

## 2016-03-28 ENCOUNTER — Emergency Department (HOSPITAL_COMMUNITY)
Admission: EM | Admit: 2016-03-28 | Discharge: 2016-03-28 | Disposition: A | Discharge status: Home / Self Care | Payer: Medicare Other | Attending: Emergency Medicine | Admitting: Emergency Medicine

## 2016-03-28 ENCOUNTER — Emergency Department (HOSPITAL_COMMUNITY): Payer: Medicare Other

## 2016-03-28 ENCOUNTER — Encounter (HOSPITAL_COMMUNITY): Payer: Self-pay | Admitting: Emergency Medicine

## 2016-03-28 DIAGNOSIS — C61 Malignant neoplasm of prostate: Secondary | ICD-10-CM | POA: Diagnosis not present

## 2016-03-28 DIAGNOSIS — Z87891 Personal history of nicotine dependence: Secondary | ICD-10-CM | POA: Insufficient documentation

## 2016-03-28 DIAGNOSIS — Y929 Unspecified place or not applicable: Secondary | ICD-10-CM | POA: Diagnosis not present

## 2016-03-28 DIAGNOSIS — Z79899 Other long term (current) drug therapy: Secondary | ICD-10-CM | POA: Insufficient documentation

## 2016-03-28 DIAGNOSIS — S76311A Strain of muscle, fascia and tendon of the posterior muscle group at thigh level, right thigh, initial encounter: Secondary | ICD-10-CM | POA: Diagnosis not present

## 2016-03-28 DIAGNOSIS — J45909 Unspecified asthma, uncomplicated: Secondary | ICD-10-CM | POA: Diagnosis not present

## 2016-03-28 DIAGNOSIS — IMO0001 Reserved for inherently not codable concepts without codable children: Secondary | ICD-10-CM

## 2016-03-28 DIAGNOSIS — C7951 Secondary malignant neoplasm of bone: Secondary | ICD-10-CM | POA: Insufficient documentation

## 2016-03-28 DIAGNOSIS — X58XXXA Exposure to other specified factors, initial encounter: Secondary | ICD-10-CM | POA: Insufficient documentation

## 2016-03-28 DIAGNOSIS — Y939 Activity, unspecified: Secondary | ICD-10-CM | POA: Diagnosis not present

## 2016-03-28 DIAGNOSIS — M79651 Pain in right thigh: Secondary | ICD-10-CM | POA: Diagnosis not present

## 2016-03-28 DIAGNOSIS — M25551 Pain in right hip: Secondary | ICD-10-CM | POA: Diagnosis not present

## 2016-03-28 DIAGNOSIS — S76391A Other specified injury of muscle, fascia and tendon of the posterior muscle group at thigh level, right thigh, initial encounter: Secondary | ICD-10-CM

## 2016-03-28 DIAGNOSIS — S73101A Unspecified sprain of right hip, initial encounter: Secondary | ICD-10-CM | POA: Diagnosis not present

## 2016-03-28 DIAGNOSIS — S79911A Unspecified injury of right hip, initial encounter: Secondary | ICD-10-CM | POA: Diagnosis present

## 2016-03-28 DIAGNOSIS — R52 Pain, unspecified: Secondary | ICD-10-CM

## 2016-03-28 DIAGNOSIS — Y999 Unspecified external cause status: Secondary | ICD-10-CM | POA: Diagnosis not present

## 2016-03-28 MED ORDER — CYCLOBENZAPRINE HCL 10 MG PO TABS: 10.0000 mg | ORAL_TABLET | Freq: Two times a day (BID) | ORAL | 0 refills | Status: DC | PRN

## 2016-03-28 MED ORDER — CYCLOBENZAPRINE HCL 10 MG PO TABS
10.0000 mg | ORAL_TABLET | Freq: Once | ORAL | Status: AC
  Administered 2016-03-28: 10 mg via ORAL
  Filled 2016-03-28: qty 1

## 2016-03-28 NOTE — Progress Notes (Signed)
   03/28/16 0000  CM Assessment  Expected Discharge Plan Home/Self Care  In-house Referral NA  Discharge Planning Services CM Consult  [facility] Choice Durable Medical Equipment  Choice offered to / list presented to  Patient  DME Arranged Walker rolling  DME [place]  [facility] Agency NA  Status of Service Completed, signed off  Discharge Disposition Home/Self Care   [place] DME Coordinator for [facility] consulted after EDP, [place] requested a RW for pt having difficulty using crutches in [facility] ED 1332 Brad states pt family has option of taking RW order to [facility] store [place] for pick of RW  EDP updated 1357

## 2016-03-28 NOTE — ED Provider Notes (Signed)
[place] DEPT Provider Note   CSN: [id number] Arrival date & time: 03/28/16  1023     History   Chief Complaint Chief Complaint  Patient presents with  . Hip Pain    HPI Dean Carlson is a 56 y.o. male.  The history is provided by the patient.  Hip Pain  This is a chronic problem. Episode onset: 3 months. The problem occurs constantly. The problem has been gradually worsening. Associated symptoms comments: Pain in the right hip that radiates toward the knee and worse with walking and states intermittently his leg gives out and he is afraid that he will fall when leg gives out.  NO falls or trauma.  Known bone met to the ischium to start radiation soon.. The symptoms are aggravated by walking. The symptoms are relieved by rest. Treatments tried: every 3 days will take an oxycodone but is scared of being addicted. The treatment provided no relief.    Past Medical History:  Diagnosis Date  . Adjustment disorder with mixed anxiety and depressed mood 05/09/2015  . Asthma    as a child  . Dizziness 05/09/2015  . Grief 05/09/2015  . HIV (human immunodeficiency virus infection) ([place])   . Insomnia 05/09/2015  . Prostate cancer ([place])   . Weight gain 05/09/2015    Patient Active Problem List   Diagnosis Date Noted  . Adjustment disorder with mixed anxiety and depressed mood 05/09/2015  . Insomnia 05/09/2015  . Weight gain 05/09/2015  . Dizziness 05/09/2015  . Grief 05/09/2015  . Encounter for chemotherapy management 02/09/2015  . Allergic reaction 09/28/2014  . Prostate cancer ([place]) 11/11/2012  . Elevated PSA 11/17/2011  . BPH (benign prostatic hyperplasia) 11/17/2011  . Prostatitis 07/22/2011  . Testicular pain 07/21/2011  . Colon cancer screening 05/13/2011  . Rectal bleeding 05/13/2011  . Abdominal pain, other specified site 04/23/2011  . Screening for STD (sexually transmitted disease) 07/22/2010  . Hypogonadism male 07/22/2010  . Smoker 07/22/2010  . MOOD DISORDER IN  CONDITIONS CLASSIFIED ELSEWHERE 04/26/2009  . INSOMNIA UNSPECIFIED 10/30/2008  . SKIN RASH 01/24/2008  . HYPERLIPIDEMIA 08/24/2007  . ERECTILE DYSFUNCTION 08/24/2007  . HYPERGLYCEMIA 08/24/2007  . DISORDER, DEPRESSIVE NEC 12/31/2006  . CHEST PAIN, NON-CARDIAC 12/31/2006  . Human immunodeficiency virus (HIV) disease ([place]) 04/14/2006  . HSV 04/14/2006  . PNEUMOCYSTIS PNEUMONIA 04/14/2006    Past Surgical History:  Procedure Laterality Date  . COLONOSCOPY  05/13/2011   Procedure: COLONOSCOPY;  Surgeon: Jerene Bears, MD;  Location: [facility] ENDOSCOPY;  Service: Gastroenterology;  Laterality: N/A;  . PROSTATE BIOPSY  08/16/2012       Home Medications    Prior to Admission medications   Medication Sig Start Date End Date Taking? Authorizing Provider  abacavir-dolutegravir-lamiVUDine (TRIUMEQ) 600-50-300 MG tablet Take 1 tablet by mouth daily. 03/17/16  Yes Truman Hayward, MD  CALCIUM PO Take 1 tablet by mouth daily.   Yes Historical Provider, MD  dolutegravir (TIVICAY) 50 MG tablet Take 1 tablet (50 mg total) by mouth at bedtime. 03/17/16  Yes Truman Hayward, MD  leuprolide (LUPRON) 30 MG injection Inject 30 mg into the muscle every 4 (four) months.   Yes Historical Provider, MD  oxyCODONE-acetaminophen (PERCOCET/ROXICET) 5-325 MG tablet Take 1-2 tablets by mouth every 6 (six) hours as needed for severe pain. 1 to 2 tabs PO q6hrs  PRN for pain 03/06/16  Yes Wyatt Portela, MD  enzalutamide [name]) 40 MG capsule Take 4 capsules (160 mg total) by mouth daily.  Patient not taking: Reported on 03/17/2016 03/14/16   Wyatt Portela, MD    Family History Family History  Problem Relation Age of Onset  . Diabetes    . Cancer Neg Hx     Social History Social History  Substance Use Topics  . Smoking status: Former Smoker    Packs/day: 1.00    Types: Cigarettes    Quit date: 03/15/2011  . Smokeless tobacco: Never Used  . Alcohol use No     Comment: occ     Allergies   Alka-seltzer  [aspirin effervescent] and Excedrin extra strength [aspirin-acetaminophen-caffeine]   Review of Systems Review of Systems  All other systems reviewed and are negative.    Physical Exam Updated Vital Signs BP 160/85 (BP Location: Left Arm)   Pulse 97   Temp 98.5 F (36.9 C) (Oral)   Resp 16   Wt 220 lb (99.8 kg)   SpO2 100%   BMI 30.68 kg/m   Physical Exam  Constitutional: He is oriented to person, place, and time. He appears well-developed and well-nourished. No distress.  HENT:  Head: Normocephalic and atraumatic.  Eyes: EOM are normal. Pupils are equal, round, and reactive to light.  Cardiovascular: Normal rate.   Pulmonary/Chest: Effort normal.  Musculoskeletal: He exhibits tenderness. He exhibits no deformity.       Right hip: He exhibits tenderness.       Right knee: Normal.       Legs: No notable lower ext edema or calf tenderness  Neurological: He is alert and oriented to person, place, and time. He has normal strength. No sensory deficit.  Pt is able to ambulate but does limp when putting weight on the right leg  Skin: Skin is warm and dry.  Psychiatric: He has a normal mood and affect. His behavior is normal.  Nursing note and vitals reviewed.    ED Treatments / Results  Labs (all labs ordered are listed, but only abnormal results are displayed) Labs Reviewed - No data to display  EKG  EKG Interpretation None       Radiology Dg Pelvis 1-2 Views  Result Date: 03/28/2016 CLINICAL DATA:  RIGHT hip pain, no known injury, history prostate cancer EXAM: PELVIS - 1-2 VIEW COMPARISON:  Bone scan 01/07/2016, CT RIGHT hip 10/30/2015 FINDINGS: Question osseous demineralization versus related to technique. Hip and SI joint spaces symmetric and preserved. No acute fracture or dislocation. Bony expansion and mixed lytic and lucent process involving the RIGHT ischium extending to the RIGHT inferior pubic ramus suspicious for metastatic disease. No additional areas of  bone destruction or sclerosis identified. IMPRESSION: Suspected osseous metastasis involving the RIGHT ischium extending to the RIGHT inferior pubic ramus, corresponding to site of abnormality on prior bone scan. Electronically Signed   By: Lavonia Dana M.D.   On: 03/28/2016 12:44   Dg Femur Min 2 Views Right  Result Date: 03/28/2016 CLINICAL DATA:  RIGHT hip pain for 5 months, no known injury, history prostate cancer, prior abnormal bone scan EXAM: RIGHT FEMUR 2 VIEWS COMPARISON:  Bone scan 01/07/2016, CT abdomen pelvis 03/20/2015 FINDINGS: Hip joint space preserved. Osseous mineralization grossly normal. No acute fracture or dislocation. Hypoattenuation within the RIGHT ischium suspicious for a metastatic focus at the site of prior abnormal increased tracer accumulation on bone scan. No additional focal osseous abnormalities seen. IMPRESSION: No acute RIGHT femoral abnormalities. Suspected osseous metastasis at the RIGHT ischium. Electronically Signed   By: Lavonia Dana M.D.   On: 03/28/2016 12:46  Procedures Procedures (including critical care time)  Medications Ordered in ED Medications  cyclobenzaprine (FLEXERIL) tablet 10 mg (not administered)     Initial Impression / Assessment and Plan / ED Course  I have reviewed the triage vital signs and the nursing notes.  Pertinent labs & imaging results that were available during my care of the patient were reviewed by me and considered in my medical decision making (see chart for details).     Patient is a 56 year old male with known bone metastases to the right she M from prostate cancer. He has had ongoing pain in his right hip but now complains of his leg giving out and he is afraid he may fall. Patient was initially given crutches 3 months ago when he was x-rayed and found to have the bone lesion that he does not feel comfortable operating with these and does not use them. He also intermittently feel spasm in his leg that is worse with  walking.  Patient denies any falls or new injury. He is set to start radiation on his leg soon. He is only taking pain medication one tablet every few days because he is scared of getting addicted. He is currently not on any muscle relaxers. There is no evidence concerning for DVT at this time. Patient does have an extremely tight hamstring tendon and muscle which may be worsening his symptoms. X-rays show no evidence of pathologic fracture or bony metastases to the femur. Right ischeal lesion is present. Discussed findings with the patient and his family. Patient given Flexeril and encouraged heating pad and muscle rubs. Also encourage patient to use the pain medication more frequently if needed. Patient also given a walker for more stability with ambulation  Final Clinical Impressions(s) / ED Diagnoses   Final diagnoses:  Prostate cancer metastatic to bone [facility])  Hamstring sprain, right, initial encounter    New Prescriptions New Prescriptions   CYCLOBENZAPRINE (FLEXERIL) 10 MG TABLET    Take 1 tablet (10 mg total) by mouth 2 (two) times daily as needed for muscle spasms.     Blanchie Dessert, MD 03/28/16 1336

## 2016-03-28 NOTE — Progress Notes (Signed)
For home use DME Rolling walker  Patient needs a walker to treat with the following condition Leg pain, diffuse, right   Bone metastases ([place])

## 2016-03-28 NOTE — Discharge Instructions (Signed)
Your x-rays today did not show any new areas of cancer in your bone and there are no sign of fractures. Concern that you have a strained hamstring. Use the muscle relaxers as needed including muscle rubs such as BenGay or icy hot and heat. Follow-up with your cancer Dr.  you can use the pain medicine more frequently if you need and use the walker for stability when you walk

## 2016-03-28 NOTE — ED Triage Notes (Signed)
Patient reports having prostate cancer that has spread to bone. Patient about to started back with radiation. Patient states for about 3 months been having right hip pain that is worse with weight bearing and changing positions.  Patient states that he feels like his leg will give out on him at time.

## 2016-03-28 NOTE — Progress Notes (Signed)
ED CM spoke with pt and male family member at bedside about availability of obtaining RW at [facility] store with [facility] EPIC order per [place] [facility] of [facility] [phone] Cm provided pt copy of [facility] EPIC order for RW and the [address] [place] address of [facility] store Pt and male voiced understanding and appreciation  EDP updated

## 2016-04-07 ENCOUNTER — Ambulatory Visit
Admission: RE | Admit: 2016-04-07 | Discharge: 2016-04-07 | Disposition: A | Discharge status: Home / Self Care | Payer: Medicare Other | Source: Ambulatory Visit | Attending: Radiation Oncology | Admitting: Radiation Oncology

## 2016-04-07 ENCOUNTER — Other Ambulatory Visit: Payer: Medicare Other

## 2016-04-07 ENCOUNTER — Encounter: Payer: Self-pay | Admitting: Radiation Oncology

## 2016-04-07 VITALS — BP 141/87 | HR 101 | Temp 98.4°F | Resp 18 | Wt 227.2 lb

## 2016-04-07 DIAGNOSIS — C7952 Secondary malignant neoplasm of bone marrow: Principal | ICD-10-CM

## 2016-04-07 DIAGNOSIS — G4700 Insomnia, unspecified: Secondary | ICD-10-CM | POA: Insufficient documentation

## 2016-04-07 DIAGNOSIS — Z87891 Personal history of nicotine dependence: Secondary | ICD-10-CM | POA: Diagnosis not present

## 2016-04-07 DIAGNOSIS — Z79818 Long term (current) use of other agents affecting estrogen receptors and estrogen levels: Secondary | ICD-10-CM | POA: Diagnosis not present

## 2016-04-07 DIAGNOSIS — C61 Malignant neoplasm of prostate: Secondary | ICD-10-CM | POA: Insufficient documentation

## 2016-04-07 DIAGNOSIS — C7951 Secondary malignant neoplasm of bone: Secondary | ICD-10-CM | POA: Diagnosis not present

## 2016-04-07 MED FILL — XTANDI 40 MG CAPSULE: 40 | 30 days supply | Qty: 120 | Fill #0

## 2016-04-07 NOTE — Progress Notes (Signed)
See progress note under physician encounter. 

## 2016-04-07 NOTE — Progress Notes (Signed)
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    ICD-9-CM ICD-10-CM   1. Prostate cancer ([place]) [place] Ambulatory referral to Home Health  2. Bone metastasis ([facility]) 198.5 C79.51 Ambulatory referral to Home Health    HISTORY OF PRESENT ILLNESS: Dean Carlson is a 56 y.o. male seen at the request of Dr. Alen Blew with a history of castrate resistant, metastatic prostate cancer with pelvic adenopathy. He was diagnosed in 2014 when he had a Gleason 4+5 cancer with a PSA of 230. PSA nadired to 1 with ADT and radiotherapy to the prostate and nodes.  He received radation 03/07/2013-05/24/2013 prostate, seminal vesicles, and pelvic lymph nodes were initially treated to 45 gray in 25 fractions of 1.8 gray, then the prostate only was boosted to 75 gray with 15 additional fractions of 2.0 gray. Since treatment, the patient was placed on Lupron therapy. He had an excellent response and his PSA dropped down to 0.97 in August 2015. His PSA began to rise to 18.76 in April of 2016. His PSA was 30.85 on 10/19/14. He was started on Xtandi. The patient reported muscle cramps and stopped treatment after one week. His most recent bone scan in May 2016 showed a questionable L2 sclerotic lesion. MRI of the lumbar spine on 08/11/14 showed no evidence of bony metastasis. Whole body scan on 01/07/16 showed increased activity over the right parietal, left temporal, and left occipital skull. It also showed increased activity over the right ischium. The patient was previously treated with Zytiga for a total dose of 1000 mg daily, but discontinued this to lack of efficacy for his PSA. This continue to  rise, and was most recently to 37.5. He is considering a change in systemic therapy with Dr. Alen Blew. He has noticed increasing "catching" of his hip with walking and went to the ED on 03/28/16. Imaging showed suspected osseous metastasis involving the right ischium extending to the right inferior pubic ramus corresponding to previous abnormality. He comes today to discuss the role of palliative radiotherapy.              PREVIOUS RADIATION THERAPY: Yes   03/07/2013-05/24/2013:  45 Gy to the prostate, seminal vesicles, and pelvic lymph nodes with 75 Gy prostate boost  PAST MEDICAL HISTORY:  Past Medical History:  Diagnosis Date  . Adjustment disorder with mixed anxiety and depressed mood 05/09/2015  . Asthma    as a child  . Dizziness 05/09/2015  . Grief 05/09/2015  . HIV (human immunodeficiency virus infection) ([place])   . Insomnia 05/09/2015  . Prostate cancer ([place])   . Weight gain 05/09/2015      PAST SURGICAL HISTORY: Past Surgical History:  Procedure Laterality Date  . COLONOSCOPY  05/13/2011   Procedure: COLONOSCOPY;  Surgeon: Jerene Bears, MD;  Location[facility];  Service: Gastroenterology;  Laterality: N/A;  . PROSTATE BIOPSY  08/16/2012    FAMILY HISTORY:  Family History  Problem Relation Age of Onset  . Diabetes    . Cancer Neg Hx     SOCIAL HISTORY:  Social History   Social History  . Marital status: Legally Separated    Spouse name: N/A  .  Number of children: N/A  . Years of education: N/A   Occupational History  . permanant disability    Social History Main Topics  . Smoking status: Former Smoker    Packs/day: 1.00    Types: Cigarettes    Quit date: 03/15/2011  . Smokeless tobacco: Never Used  . Alcohol use No     Comment: occ  . Drug use: No  . Sexual activity: Not Currently   Other Topics Concern  . Not on file   Social History Narrative  . No narrative on file  The patient is on disability. He is single and enjoys playing music on the  computer.  ALLERGIES: Alka-seltzer [aspirin effervescent] and Excedrin extra strength [aspirin-acetaminophen-caffeine]  MEDICATIONS:  Current Outpatient Prescriptions  Medication Sig Dispense Refill  . abacavir-dolutegravir-lamiVUDine (TRIUMEQ) 600-50-300 MG tablet Take 1 tablet by mouth daily. 30 tablet 11  . CALCIUM PO Take 1 tablet by mouth daily.    . cyclobenzaprine (FLEXERIL) 10 MG tablet Take 1 tablet (10 mg total) by mouth 2 (two) times daily as needed for muscle spasms. 20 tablet 0  . dolutegravir (TIVICAY) 50 MG tablet Take 1 tablet (50 mg total) by mouth at bedtime. 30 tablet 11  . leuprolide (LUPRON) 30 MG injection Inject 30 mg into the muscle every 4 (four) months.    Marland Kitchen oxyCODONE-acetaminophen (PERCOCET/ROXICET) 5-325 MG tablet Take 1-2 tablets by mouth every 6 (six) hours as needed for severe pain. 1 to 2 tabs PO q6hrs  PRN for pain 60 tablet 0  . enzalutamide (XTANDI) 40 MG capsule Take 4 capsules (160 mg total) by mouth daily. (Patient not taking: Reported on 03/17/2016) 120 capsule 0   No current facility-administered medications for this encounter.     REVIEW OF SYSTEMS:  On review of systems, the patient reports that he is doing well overall. He denies any chest pain, shortness of breath, cough, fevers, chills, night sweats, unintended weight changes. He denies any bowel or bladder disturbances, and denies abdominal pain, nausea or vomiting. He notes weakness of the right leg when walking. He denies any sensory loss in the leg, but states he has had a history of neuropathy. He is ambulatory and uses a walker at home. He denies any new musculoskeletal complaints other than stated above. A complete review of systems is obtained and is otherwise negative.    PHYSICAL EXAM:  Wt Readings from Last 3 Encounters:  04/07/16 227 lb 3.2 oz (103.1 kg)  03/28/16 200 lb (90.7 kg)  03/06/16 229 lb 8 oz (104.1 kg)   Temp Readings from Last 3 Encounters:  04/07/16 98.4 F (36.9 C)  (Oral)  03/28/16 98.5 F (36.9 C) (Oral)  03/06/16 99.1 F (37.3 C) (Oral)   BP Readings from Last 3 Encounters:  04/07/16 (!) 141/87  03/28/16 160/85  03/06/16 140/80   Pulse Readings from Last 3 Encounters:  04/07/16 (!) 101  03/28/16 97  03/06/16 82   Pain Assessment Pain Score:  (see progress note)/10  In general this is a well appearing African American male in no acute distress. He is alert and oriented x4 and appropriate throughout the examination. HEENT reveals that the patient is normocephalic, atraumatic. EOMs are intact. PERRLA. Skin is intact without any evidence of gross lesions. Cardiopulmonary assessment is negative for acute distress and he exhibits normal effort.  Lower extremities are negative for pretibial pitting edema, deep calf tenderness, cyanosis or clubbing.   KPS = 80  100 - Normal; no complaints; no evidence  of disease. 90   - Able to carry on normal activity; minor signs or symptoms of disease. 80   - Normal activity with effort; some signs or symptoms of disease. 80   - Cares for self; unable to carry on normal activity or to do active work. 60   - Requires occasional assistance, but is able to care for most of his personal needs. 50   - Requires considerable assistance and frequent medical care. 59   - Disabled; requires special care and assistance. 60   - Severely disabled; hospital admission is indicated although death not imminent. 6   - Very sick; hospital admission necessary; active supportive treatment necessary. 10   - Moribund; fatal processes progressing rapidly. 0     - Dead  Karnofsky DA, Abelmann Noma, Craver LS and Burchenal [facility] [phone]) The use of the nitrogen mustards in the palliative treatment of carcinoma: with particular reference to bronchogenic carcinoma Cancer 1 634-56  LABORATORY DATA:  Lab Results  Component Value Date   WBC 5.6 03/06/2016   HGB 12.6 (L) 03/06/2016   HCT 38.1 (L) 03/06/2016   MCV 89.8 03/06/2016   PLT 212  03/06/2016   Lab Results  Component Value Date   NA 143 03/06/2016   K 3.7 03/06/2016   CL 106 10/05/2015   CO2 29 03/06/2016   Lab Results  Component Value Date   ALT 15 03/06/2016   AST 14 03/06/2016   ALKPHOS 308 (H) 03/06/2016   BILITOT 0.31 03/06/2016     RADIOGRAPHY: Dg Pelvis 1-2 Views  Result Date: 03/28/2016 CLINICAL DATA:  RIGHT hip pain, no known injury, history prostate cancer EXAM: PELVIS - 1-2 VIEW COMPARISON:  Bone scan 01/07/2016, CT RIGHT hip 10/30/2015 FINDINGS: Question osseous demineralization versus related to technique. Hip and SI joint spaces symmetric and preserved. No acute fracture or dislocation. Bony expansion and mixed lytic and lucent process involving the RIGHT ischium extending to the RIGHT inferior pubic ramus suspicious for metastatic disease. No additional areas of bone destruction or sclerosis identified. IMPRESSION: Suspected osseous metastasis involving the RIGHT ischium extending to the RIGHT inferior pubic ramus, corresponding to site of abnormality on prior bone scan. Electronically Signed   By: Lavonia Dana M.D.   On: 03/28/2016 12:44   Dg Femur Min 2 Views Right  Result Date: 03/28/2016 CLINICAL DATA:  RIGHT hip pain for 5 months, no known injury, history prostate cancer, prior abnormal bone scan EXAM: RIGHT FEMUR 2 VIEWS COMPARISON:  Bone scan 01/07/2016, CT abdomen pelvis 03/20/2015 FINDINGS: Hip joint space preserved. Osseous mineralization grossly normal. No acute fracture or dislocation. Hypoattenuation within the RIGHT ischium suspicious for a metastatic focus at the site of prior abnormal increased tracer accumulation on bone scan. No additional focal osseous abnormalities seen. IMPRESSION: No acute RIGHT femoral abnormalities. Suspected osseous metastasis at the RIGHT ischium. Electronically Signed   By: Lavonia Dana M.D.   On: 03/28/2016 12:46      IMPRESSION/PLAN: 1. 56 y.o. 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 Dr. Tammi Klippel reviews the imaging findings, and outlines the role for radiotherapy in the setting of metastatic prostate cancer to the bone when it becomes symptomatic. The patient is going to proceed with systemic therapy under the care of Dr. Alen Blew as well. Dr. Tammi Klippel has outlined the options for palliative radiotherapy to the right ishium/hip. He has received previous pelvic radiation which may increase the risk for toxicity from re-irradiation. However, we believe  that another course of radiation may be a safe option for treatment. The risks, benefits, short, and long term effects of radiotherapy were outlined as well as the delivery and logistics of treatment. Dr. Tammi Klippel recommended course of 10 fractions over 2 weeks. He will be scheduled today for simulation, written consent was obtained and placed in the chart. 2. Financial constraints. We will contact social work to see if there is anything that can be done to help the patient with his transportation needs. 3. Evaluation for safety. The patient is offered physical therapy assessment in the home to determine if he needs any assistive devices when walking. He does take public transportation regularly, and could be at higher risk of falling  due to the mode of transportation and logistics of these needs. I will correlate physical therapy assessment.   In a visit lasting 30 minutes, greater than 50% of the time was spent face to face discussing his diagnosis, and treatment considerations, and coordinating the patient's care.  The above documentation reflects my direct findings during this shared patient visit. Please see the separate note by Dr. Tammi Klippel on this date for the remainder of the patient's plan of care.    Carola Rhine, PAC  This document serves as a record of services personally performed by Shona Simpson, PA-C and Tyler Pita, MD. It was created on their behalf by Bethann Humble, a trained medical  scribe. The creation of this record is based on the scribe's personal observations and the provider's statements to them. This document has been checked and approved by the attending provider.         Please see the collaborative note above including primarily contributions from [employer] and my input/edits.  I have personally performed a face to face diagnostic evaluation on this patient and devised the assessment and plan.    ------------------------------------------------   Tyler Pita, MD [place] Director and Director of Stereotactic Radiosurgery Direct Dial: [phone]  Fax: [phone] [email or web address]  Skype  LinkedIn

## 2016-04-07 NOTE — Progress Notes (Signed)
  Radiation Oncology         [phone] ________________________________  Name: Dean Carlson MRN: [id number]  Date: 04/07/2016  DOB: 1960-11-13  SIMULATION AND TREATMENT PLANNING NOTE    ICD-9-CM ICD-10-CM   1. Prostate cancer ([place]) 185 C61     DIAGNOSIS:  56 year old gentleman with painful right ischial metastasis from castration resistant prostate cancer with previous pelvic radiotherapy  NARRATIVE:  The patient was brought to the [facility].  Identity was confirmed.  All relevant records and images related to the planned course of therapy were reviewed.  The patient freely provided informed written consent to proceed with treatment after reviewing the details related to the planned course of therapy. The consent form was witnessed and verified by the simulation staff.  Then, the patient was set-up in a stable reproducible  supine position for radiation therapy.  CT images were obtained.  Surface markings were placed.  The CT images were loaded into the planning software.  Then the target and avoidance structures were contoured.  Treatment planning then occurred.  The radiation prescription was entered and confirmed.  Then, I designed and supervised the construction of a total of one medically necessary complex treatment devices.  I have requested : 3D Simulation  I have requested a DVH of the following structures: rectum, bladder, rt femur, lt femur and targeted metastasis.  SPECIAL TREATMENT PROCEDURE:  The planned course of therapy using radiation constitutes a special treatment procedure. Special care is required in the management of this patient for the following reasons. This treatment constitutes a Special Treatment Procedure for the following reason: [ Retreatment in a previously radiated area requiring careful monitoring of increased risk of toxicity due to overlap of previous treatment..  The special nature of the planned course of radiotherapy will require increased  physician supervision and oversight to ensure patient's safety with optimal treatment outcomes.  PLAN:  The patient will receive 30 Gy in 10 fraction.  ________________________________  Sheral Apley Tammi Klippel, M.D.

## 2016-04-07 NOTE — Progress Notes (Signed)
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  His PSA did rise to 18.76 in April 2016 and most recently up to 30.85 on 10/19/2014. At that time he was started on Xtandi and tolerated it poorly. He reported muscle cramps and took it for one week only.  He had been off any treatment other than Lupron since that time. His most recent bone scan was done in May 2016 showed a questionable L2 sclerotic lesion although an MRI obtained at that time of the lumbar spine did not show any evidence of bony metastasis.  Current therapy: Zytiga for a total dose of 1000 mg daily started on 02/15/2015. His dose was changed to 500 mg starting on 08/30/2015. Tivicay and [place] for his HIV. These were changed in December 2016 to prevent interactions with Zytiga. Patient reports he has been off [place] for 1 week. To start [place] today.   Pain on a scale of 0-10 is: Reports pain while sitting 9 on a scale of 0-10. Denies pain while ambulating   If Spine Met(s), symptoms, if any, include:  Bowel/Bladder retention or incontinence (please describe): No  Numbness or weakness in extremities (please describe): weakness right leg  Current Decadron regimen, if applicable: no  Ambulatory status? Walker? Wheelchair?: Ambulatory  SAFETY ISSUES:  Prior radiation? yes  Pacemaker/ICD? no  Possible current pregnancy? no  Is the patient on methotrexate? no  Current Complaints / other details:  56 year old male. Due for Lupron this month. Ride in on [facility].

## 2016-04-08 ENCOUNTER — Encounter: Payer: Self-pay | Admitting: *Deleted

## 2016-04-08 NOTE — Progress Notes (Signed)
CSW received referral from radiation oncology, patient unable to afford fare for [facility] transportation.  CSW contacted patient by phone.  Patient plans to visit CSW at upcoming appointment.  CSW will provide patient with [facility] pass, patient will pay for remainder of necessary rides.  Polo Riley, MSW, LCSW, OSW-C Clinical Social Worker [facility] [phone]

## 2016-04-10 DIAGNOSIS — Z87891 Personal history of nicotine dependence: Secondary | ICD-10-CM | POA: Diagnosis not present

## 2016-04-10 DIAGNOSIS — G4700 Insomnia, unspecified: Secondary | ICD-10-CM | POA: Diagnosis not present

## 2016-04-10 DIAGNOSIS — C61 Malignant neoplasm of prostate: Secondary | ICD-10-CM | POA: Diagnosis not present

## 2016-04-10 DIAGNOSIS — C7951 Secondary malignant neoplasm of bone: Secondary | ICD-10-CM | POA: Diagnosis not present

## 2016-04-11 DIAGNOSIS — B20 Human immunodeficiency virus [HIV] disease: Secondary | ICD-10-CM | POA: Diagnosis not present

## 2016-04-11 DIAGNOSIS — C61 Malignant neoplasm of prostate: Secondary | ICD-10-CM | POA: Diagnosis not present

## 2016-04-11 DIAGNOSIS — Z9181 History of falling: Secondary | ICD-10-CM | POA: Diagnosis not present

## 2016-04-11 DIAGNOSIS — S76311D Strain of muscle, fascia and tendon of the posterior muscle group at thigh level, right thigh, subsequent encounter: Secondary | ICD-10-CM | POA: Diagnosis not present

## 2016-04-11 DIAGNOSIS — Z87891 Personal history of nicotine dependence: Secondary | ICD-10-CM | POA: Diagnosis not present

## 2016-04-11 DIAGNOSIS — C7951 Secondary malignant neoplasm of bone: Secondary | ICD-10-CM | POA: Diagnosis not present

## 2016-04-11 DIAGNOSIS — F4323 Adjustment disorder with mixed anxiety and depressed mood: Secondary | ICD-10-CM | POA: Diagnosis not present

## 2016-04-14 ENCOUNTER — Ambulatory Visit
Admission: RE | Admit: 2016-04-14 | Discharge: 2016-04-14 | Disposition: A | Discharge status: Home / Self Care | Payer: Medicare Other | Source: Ambulatory Visit | Attending: Radiation Oncology | Admitting: Radiation Oncology

## 2016-04-14 DIAGNOSIS — G4700 Insomnia, unspecified: Secondary | ICD-10-CM | POA: Diagnosis not present

## 2016-04-14 DIAGNOSIS — Z87891 Personal history of nicotine dependence: Secondary | ICD-10-CM | POA: Diagnosis not present

## 2016-04-14 DIAGNOSIS — C7951 Secondary malignant neoplasm of bone: Secondary | ICD-10-CM | POA: Diagnosis not present

## 2016-04-14 DIAGNOSIS — C61 Malignant neoplasm of prostate: Secondary | ICD-10-CM | POA: Diagnosis not present

## 2016-04-15 ENCOUNTER — Ambulatory Visit
Admission: RE | Admit: 2016-04-15 | Discharge: 2016-04-15 | Disposition: A | Discharge status: Home / Self Care | Payer: Medicare Other | Source: Ambulatory Visit | Attending: Radiation Oncology | Admitting: Radiation Oncology

## 2016-04-15 ENCOUNTER — Encounter: Payer: Self-pay | Admitting: *Deleted

## 2016-04-15 DIAGNOSIS — C7951 Secondary malignant neoplasm of bone: Secondary | ICD-10-CM | POA: Diagnosis not present

## 2016-04-15 DIAGNOSIS — G4700 Insomnia, unspecified: Secondary | ICD-10-CM | POA: Diagnosis not present

## 2016-04-15 DIAGNOSIS — C61 Malignant neoplasm of prostate: Secondary | ICD-10-CM | POA: Diagnosis not present

## 2016-04-15 DIAGNOSIS — Z9181 History of falling: Secondary | ICD-10-CM | POA: Diagnosis not present

## 2016-04-15 DIAGNOSIS — Z87891 Personal history of nicotine dependence: Secondary | ICD-10-CM | POA: Diagnosis not present

## 2016-04-15 DIAGNOSIS — F4323 Adjustment disorder with mixed anxiety and depressed mood: Secondary | ICD-10-CM | POA: Diagnosis not present

## 2016-04-15 DIAGNOSIS — B20 Human immunodeficiency virus [HIV] disease: Secondary | ICD-10-CM | POA: Diagnosis not present

## 2016-04-15 DIAGNOSIS — S76311D Strain of muscle, fascia and tendon of the posterior muscle group at thigh level, right thigh, subsequent encounter: Secondary | ICD-10-CM | POA: Diagnosis not present

## 2016-04-16 ENCOUNTER — Ambulatory Visit
Admission: RE | Admit: 2016-04-16 | Discharge: 2016-04-16 | Disposition: A | Discharge status: Home / Self Care | Payer: Medicare Other | Source: Ambulatory Visit | Attending: Radiation Oncology | Admitting: Radiation Oncology

## 2016-04-16 DIAGNOSIS — C7951 Secondary malignant neoplasm of bone: Secondary | ICD-10-CM | POA: Diagnosis not present

## 2016-04-16 DIAGNOSIS — Z87891 Personal history of nicotine dependence: Secondary | ICD-10-CM | POA: Diagnosis not present

## 2016-04-16 DIAGNOSIS — G4700 Insomnia, unspecified: Secondary | ICD-10-CM | POA: Diagnosis not present

## 2016-04-16 DIAGNOSIS — C61 Malignant neoplasm of prostate: Secondary | ICD-10-CM | POA: Diagnosis not present

## 2016-04-17 ENCOUNTER — Ambulatory Visit (HOSPITAL_BASED_OUTPATIENT_CLINIC_OR_DEPARTMENT_OTHER): Payer: Medicare Other | Admitting: Oncology

## 2016-04-17 ENCOUNTER — Other Ambulatory Visit (HOSPITAL_BASED_OUTPATIENT_CLINIC_OR_DEPARTMENT_OTHER): Payer: Medicare Other

## 2016-04-17 ENCOUNTER — Telehealth: Payer: Self-pay | Admitting: Oncology

## 2016-04-17 ENCOUNTER — Ambulatory Visit
Admission: RE | Admit: 2016-04-17 | Discharge: 2016-04-17 | Disposition: A | Discharge status: Home / Self Care | Payer: Medicare Other | Source: Ambulatory Visit | Attending: Radiation Oncology | Admitting: Radiation Oncology

## 2016-04-17 ENCOUNTER — Encounter: Payer: Self-pay | Admitting: Nutrition

## 2016-04-17 ENCOUNTER — Ambulatory Visit: Payer: Medicare Other | Admitting: Infectious Disease

## 2016-04-17 VITALS — BP 145/92 | HR 110 | Temp 98.8°F | Resp 19 | Wt 217.2 lb

## 2016-04-17 VITALS — BP 121/91 | HR 102 | Resp 18 | Wt 217.2 lb

## 2016-04-17 DIAGNOSIS — C61 Malignant neoplasm of prostate: Secondary | ICD-10-CM | POA: Diagnosis not present

## 2016-04-17 DIAGNOSIS — C7951 Secondary malignant neoplasm of bone: Secondary | ICD-10-CM | POA: Diagnosis not present

## 2016-04-17 DIAGNOSIS — G4700 Insomnia, unspecified: Secondary | ICD-10-CM | POA: Diagnosis not present

## 2016-04-17 DIAGNOSIS — C775 Secondary and unspecified malignant neoplasm of intrapelvic lymph nodes: Secondary | ICD-10-CM

## 2016-04-17 DIAGNOSIS — B20 Human immunodeficiency virus [HIV] disease: Secondary | ICD-10-CM | POA: Diagnosis not present

## 2016-04-17 DIAGNOSIS — M8588 Other specified disorders of bone density and structure, other site: Secondary | ICD-10-CM

## 2016-04-17 DIAGNOSIS — Z87891 Personal history of nicotine dependence: Secondary | ICD-10-CM | POA: Diagnosis not present

## 2016-04-17 LAB — COMPREHENSIVE METABOLIC PANEL
ALT: 16 U/L (ref 0–55)
AST: 14 U/L (ref 5–34)
Albumin: 3.9 g/dL (ref 3.5–5.0)
Alkaline Phosphatase: 229 U/L — ABNORMAL HIGH (ref 40–150)
Anion Gap: 10 mEq/L (ref 3–11)
BUN: 10.1 mg/dL (ref 7.0–26.0)
CALCIUM: 9.7 mg/dL (ref 8.4–10.4)
CHLORIDE: 105 meq/L (ref 98–109)
CO2: 27 meq/L (ref 22–29)
CREATININE: 0.8 mg/dL (ref 0.7–1.3)
EGFR: >90 mL/min/{1.73_m2} (ref >90)
GLUCOSE: 92 mg/dL (ref 70–140)
POTASSIUM: 4 meq/L (ref 3.5–5.1)
SODIUM: 142 meq/L (ref 136–145)
Total Bilirubin: 0.37 mg/dL (ref 0.20–1.20)
Total Protein: 8.2 g/dL (ref 6.4–8.3)

## 2016-04-17 LAB — CBC WITH DIFFERENTIAL/PLATELET
BASO%: 0.6 % (ref 0.0–2.0)
BASOS ABS: 0 10*3/uL (ref 0.0–0.1)
EOS%: 1 % (ref 0.0–7.0)
Eosinophils Absolute: 0.1 10*3/uL (ref 0.0–0.5)
HEMATOCRIT: 37.7 % — AB (ref 38.4–49.9)
HGB: 12.6 g/dL — ABNORMAL LOW (ref 13.0–17.1)
LYMPH#: 1.9 10*3/uL (ref 0.9–3.3)
LYMPH%: 26.9 % (ref 14.0–49.0)
MCH: 29.9 pg (ref 27.2–33.4)
MCHC: 33.4 g/dL (ref 32.0–36.0)
MCV: 89.6 fL (ref 79.3–98.0)
MONO#: 0.7 10*3/uL (ref 0.1–0.9)
MONO%: 9.8 % (ref 0.0–14.0)
NEUT#: 4.4 10*3/uL (ref 1.5–6.5)
NEUT%: 61.7 % (ref 39.0–75.0)
Platelets: 273 10*3/uL (ref 140–400)
RBC: 4.21 10*6/uL (ref 4.20–5.82)
RDW: 14.4 % (ref 11.0–14.6)
WBC: 7.2 10*3/uL (ref 4.0–10.3)

## 2016-04-17 MED ORDER — CYCLOBENZAPRINE HCL 10 MG PO TABS: 10.0000 mg | ORAL_TABLET | Freq: Two times a day (BID) | ORAL | 0 refills | Status: DC | PRN

## 2016-04-17 MED FILL — TRIUMEQ 600-50-300 MG TABS: 600-50-300 | 30 days supply | Qty: 30 | Fill #1

## 2016-04-17 NOTE — Progress Notes (Signed)
  Radiation Oncology         [phone]   Name: Dean Carlson MRN: [id number]   Date: 04/17/2016  DOB: [birth year]   Weekly Radiation Therapy Management    ICD-9-CM ICD-10-CM   1. Prostate cancer ([place]) 185 C61     Current Dose: 12 Gy  Planned Dose:  30 Gy  Narrative The patient presents for routine under treatment assessment.  Weight stable. Diastolic BP and heart rate elevated. Reports right leg pain 3/10 in severity. Reports he hasn't had any pain medication in three days. Reports the pain hasn't been such that he needs it. 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 Reports in recent days he has been able to sit and play his music pain free. Denies edema, numbness or tingling of lower extremities. Reports he has bowel movement this morning. Requesting refill of flexeril.  Reports hot flashes associated with Lupron continue.   The patient is without complaint. Set-up films were reviewed. The chart was checked.  Physical Findings  weight is 217 lb 3.2 oz (98.5 kg). His blood pressure is 121/91 (abnormal) and his pulse is 102 (abnormal). His respiration is 18 and oxygen saturation is 98%.  Weight essentially stable.  No significant changes.  Impression The patient is tolerating radiation.  Plan Continue treatment as planned.          Sheral Apley Tammi Klippel, M.D.  This document serves as a record of services personally performed by Tyler Pita, MD. It was created on his behalf by Maryla Morrow, a trained medical scribe. The creation of this record is based on the scribe's personal observations and the provider's statements to them. This document has been checked and approved by the attending provider.

## 2016-04-17 NOTE — Progress Notes (Signed)
Hematology and Oncology Follow Up Visit  Dean Carlson [id number] [birth year] 56 y.o. 04/17/2016 2:41 PM Dean Carlson Dean Carlson, MDVan Newfolden, Dean Islam, MD   Principle Diagnosis: 57 year old gentleman with castration-resistant metastatic prostate cancer with disease to the pelvic lymph nodes. He was diagnosed in 2014 where he had a Gleason score 4+5 = 9 and PSA of 230. He had disease including and to the periaortic lymph nodes measuring 3 cm.   Prior Therapy: He was treated with Lupron therapy at the time of diagnosis and had an excellent response with PSA down to 0.97 back in August 2015.  His PSA did rise to 18.76 in April 2016 and most recently up to 30.85 on 10/19/2014. At that time he was started on Xtandi and tolerated it poorly. He reported muscle cramps and took it for one week only.  He had been off any treatment other than Lupron since that time. His most recent bone scan was done in May 2016 showed a questionable L2 sclerotic lesion lZytiga for a total dose of 1000 mg daily started on 02/15/2015. His dose was changed to 500 mg starting on 08/30/2015. Therapy discontinued in January 2018 because of progression of disease.  Current therapy: Palliative radiation therapy to the right hip currently ongoing under the care of Dr. Tammi Klippel. I Xtandi 160 mg daily started in January 2018.  Interim History: Dean Carlson presents today for a follow-up visit. Since his last visit, he reports initiating radiation therapy which have helped his symptoms of right-sided hip pain and leg pain. He still has some issues of pain but certainly improved slightly. He is still ambulating without any major difficulties and has not reported any recent falls. His appetite has been poor as of late and a lost 10 pounds. He feels his appetite is taken up at this time and using nutritional supplements to boost his intake.  He also started [place] and have taken it without any major complications. He does not report any excessive  fatigue or tiredness or treated with Zytiga. He did report some urinary retention at times but has improved as of late. He also reported some mild constipation.  He does not report any headaches, blurry vision, syncope or seizures. Does not report any fevers, chills, sweats or weight loss. Does not report any chest pain, palpitation orthopnea or leg edema. Does not report any cough, hemoptysis or hematemesis. He does not report any early satiety. Does not report any skeletal complaints of arthralgias or myalgias. Remaining review of systems unremarkable.   Medications: No changes in his medication noted by my review. Current Outpatient Prescriptions  Medication Sig Dispense Refill  . abacavir-dolutegravir-lamiVUDine (TRIUMEQ) 600-50-300 MG tablet Take 1 tablet by mouth daily. 30 tablet 11  . CALCIUM PO Take 1 tablet by mouth daily.    . cyclobenzaprine (FLEXERIL) 10 MG tablet Take 1 tablet (10 mg total) by mouth 2 (two) times daily as needed for muscle spasms. 30 tablet 0  . dolutegravir (TIVICAY) 50 MG tablet Take 1 tablet (50 mg total) by mouth at bedtime. 30 tablet 11  . enzalutamide (XTANDI) 40 MG capsule Take 4 capsules (160 mg total) by mouth daily. (Patient not taking: Reported on 03/17/2016) 120 capsule 0  . leuprolide (LUPRON) 30 MG injection Inject 30 mg into the muscle every 4 (four) months.    Marland Kitchen oxyCODONE-acetaminophen (PERCOCET/ROXICET) 5-325 MG tablet Take 1-2 tablets by mouth every 6 (six) hours as needed for severe pain. 1 to 2 tabs PO q6hrs  PRN for pain  60 tablet 0   No current facility-administered medications for this visit.      Allergies:  Allergies  Allergen Reactions  . Alka-Seltzer [Aspirin Effervescent] Other (See Comments)    asthma  . Excedrin Extra Strength [Aspirin-Acetaminophen-Caffeine] Other (See Comments)    REACTION: asthma    Past Medical History, Surgical history, Social history, and Family History were reviewed and updated.   Physical Exam: Blood  pressure (!) 145/92, pulse (!) 110, temperature 98.8 F (37.1 C), temperature source Oral, resp. rate 19, weight 217 lb 3.2 oz (98.5 kg), SpO2 100 %. ECOG: 1 General appearance: Alert, awake gentleman without distress. Head: Normocephalic, without obvious abnormality no oral thrush. Neck: no adenopathy no thyroid masses. Lymph nodes: Cervical, supraclavicular, and axillary nodes normal. Heart:regular rate and rhythm, S1, S2 normal, no murmur, click, rub or gallop Lung:chest clear, no wheezing, rales, normal symmetric air entry Abdomin: soft, non-tender, without masses or organomegaly no shifting dullness or ascites. EXT:no erythema, induration, or nodules   Lab Results: Lab Results  Component Value Date   WBC 7.2 04/17/2016   HGB 12.6 (L) 04/17/2016   HCT 37.7 (L) 04/17/2016   MCV 89.6 04/17/2016   PLT 273 04/17/2016     Chemistry      Component Value Date/Time   NA 143 03/06/2016 1015   K 3.7 03/06/2016 1015   CL 106 10/05/2015 0922   CO2 29 03/06/2016 1015   BUN 12.2 03/06/2016 1015   CREATININE 0.8 03/06/2016 1015      Component Value Date/Time   CALCIUM 9.4 03/06/2016 1015   ALKPHOS 308 (H) 03/06/2016 1015   AST 14 03/06/2016 1015   ALT 15 03/06/2016 1015   BILITOT 0.31 03/06/2016 1015     Impression and Plan:   56 year old gentleman with the following issues:  1. 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 His initial diagnosis back in June 2014 where he presented with a Gleason score 4+5 = 9 and a PSA of 230. After a period of androgen deprivation and a PSA nadir of less than 1, he started developing a rapid rise in his PSA and have been intolerant to [place].  CT scan on January 2017 was reviewed which showed pelvic adenopathy and potential 1.9 cm sclerotic lesion at the L2 vertebral body.  He is status post Zytiga therapy that was discontinued because of progression of disease.  He is currently on Xtandi and have  tolerated it well so far. His PSA is currently pending from today and the plan is to continue with the same dose and schedule for the time being.  2. Androgen depravation: I have recommended continuing that indefinitely.  3. HIV: His regimen switched by Dr. Tommy Carlson to accommodate [facility] therapy. He appears to have tolerated the change reasonably well.  4. LFT monitoring: His liver function tests on  on January 2018 were normal and these will be repeated today.  5. Electrolyte surveillance: His potassium and the rest of his electrolytes are within normal range.  6. Right hip pain: He is currently receiving palliative radiation therapy and have tolerated it well. He does report some mild improvement in his bone pain.  8. Follow-up: Will be in 5 weeks to follow his progress.   Zola Button, MD 2/15/20182:41 PM

## 2016-04-17 NOTE — Progress Notes (Signed)
Provided one complimentary case of Ensure Plus 

## 2016-04-17 NOTE — Progress Notes (Signed)
Weight stable. Diastolic bp and heart rate elevated. Reports right leg pain 3 on a scale of 0-10. Reports he hasn't had any pain medication in three days. Reports the pain hasn't been such that he needs it. 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 Reports in recent days he has been able to sit and play his music pain free. Denies edema, numbness or tingling of lower extremities. Reports he has bowel movement this morning. Requesting refill of flexeril. Understands Dr. Alen Blew filled this originally. Reports hot flashes associated with Lupron continue.   BP (!) 121/91 (BP Location: Right Arm, Patient Position: Sitting, Cuff Size: Large)   Pulse (!) 102   Resp 18   Wt 217 lb 3.2 oz (98.5 kg)   SpO2 98%   BMI 31.16 kg/m  Wt Readings from Last 3 Encounters:  04/17/16 217 lb 3.2 oz (98.5 kg)  04/07/16 227 lb 3.2 oz (103.1 kg)  03/28/16 200 lb (90.7 kg)

## 2016-04-17 NOTE — Telephone Encounter (Signed)
Appointments scheduled per 2/15 LOS. Patient given AVS report and calendars with future scheduled appointments. °

## 2016-04-18 ENCOUNTER — Ambulatory Visit
Admission: RE | Admit: 2016-04-18 | Discharge: 2016-04-18 | Disposition: A | Discharge status: Home / Self Care | Payer: Medicare Other | Source: Ambulatory Visit | Attending: Radiation Oncology | Admitting: Radiation Oncology

## 2016-04-18 DIAGNOSIS — G4700 Insomnia, unspecified: Secondary | ICD-10-CM | POA: Diagnosis not present

## 2016-04-18 DIAGNOSIS — C61 Malignant neoplasm of prostate: Secondary | ICD-10-CM | POA: Diagnosis not present

## 2016-04-18 DIAGNOSIS — Z87891 Personal history of nicotine dependence: Secondary | ICD-10-CM | POA: Diagnosis not present

## 2016-04-18 DIAGNOSIS — C7951 Secondary malignant neoplasm of bone: Secondary | ICD-10-CM | POA: Diagnosis not present

## 2016-04-18 LAB — PSA: Prostate Specific Ag, Serum: 61.8 ng/mL — ABNORMAL HIGH (ref 0.0–4.0)

## 2016-04-18 NOTE — Progress Notes (Signed)
[facility] Counseling Note  Counselor called client to set up first individual counseling appointment. Scheduled for 04/25/2016 following pt's radiation appointment.  Ayesha Rumpf, [place] LP[facility] [phone]

## 2016-04-21 ENCOUNTER — Ambulatory Visit
Admission: RE | Admit: 2016-04-21 | Discharge: 2016-04-21 | Disposition: A | Discharge status: Home / Self Care | Payer: Medicare Other | Source: Ambulatory Visit | Attending: Radiation Oncology | Admitting: Radiation Oncology

## 2016-04-21 DIAGNOSIS — B20 Human immunodeficiency virus [HIV] disease: Secondary | ICD-10-CM | POA: Diagnosis not present

## 2016-04-21 DIAGNOSIS — G4700 Insomnia, unspecified: Secondary | ICD-10-CM | POA: Diagnosis not present

## 2016-04-21 DIAGNOSIS — F4323 Adjustment disorder with mixed anxiety and depressed mood: Secondary | ICD-10-CM | POA: Diagnosis not present

## 2016-04-21 DIAGNOSIS — S76311D Strain of muscle, fascia and tendon of the posterior muscle group at thigh level, right thigh, subsequent encounter: Secondary | ICD-10-CM | POA: Diagnosis not present

## 2016-04-21 DIAGNOSIS — C61 Malignant neoplasm of prostate: Secondary | ICD-10-CM | POA: Diagnosis not present

## 2016-04-21 DIAGNOSIS — Z87891 Personal history of nicotine dependence: Secondary | ICD-10-CM | POA: Diagnosis not present

## 2016-04-21 DIAGNOSIS — C7951 Secondary malignant neoplasm of bone: Secondary | ICD-10-CM | POA: Diagnosis not present

## 2016-04-21 DIAGNOSIS — Z9181 History of falling: Secondary | ICD-10-CM | POA: Diagnosis not present

## 2016-04-22 ENCOUNTER — Ambulatory Visit
Admission: RE | Admit: 2016-04-22 | Discharge: 2016-04-22 | Disposition: A | Discharge status: Home / Self Care | Payer: Medicare Other | Source: Ambulatory Visit | Attending: Radiation Oncology | Admitting: Radiation Oncology

## 2016-04-22 DIAGNOSIS — Z87891 Personal history of nicotine dependence: Secondary | ICD-10-CM | POA: Diagnosis not present

## 2016-04-22 DIAGNOSIS — C7951 Secondary malignant neoplasm of bone: Secondary | ICD-10-CM | POA: Diagnosis not present

## 2016-04-22 DIAGNOSIS — Z9181 History of falling: Secondary | ICD-10-CM | POA: Diagnosis not present

## 2016-04-22 DIAGNOSIS — Z5111 Encounter for antineoplastic chemotherapy: Secondary | ICD-10-CM | POA: Diagnosis not present

## 2016-04-22 DIAGNOSIS — C61 Malignant neoplasm of prostate: Secondary | ICD-10-CM | POA: Diagnosis not present

## 2016-04-22 DIAGNOSIS — B20 Human immunodeficiency virus [HIV] disease: Secondary | ICD-10-CM | POA: Diagnosis not present

## 2016-04-22 DIAGNOSIS — S76311D Strain of muscle, fascia and tendon of the posterior muscle group at thigh level, right thigh, subsequent encounter: Secondary | ICD-10-CM | POA: Diagnosis not present

## 2016-04-22 DIAGNOSIS — G4700 Insomnia, unspecified: Secondary | ICD-10-CM | POA: Diagnosis not present

## 2016-04-22 DIAGNOSIS — F4323 Adjustment disorder with mixed anxiety and depressed mood: Secondary | ICD-10-CM | POA: Diagnosis not present

## 2016-04-23 ENCOUNTER — Ambulatory Visit
Admission: RE | Admit: 2016-04-23 | Discharge: 2016-04-23 | Disposition: A | Discharge status: Home / Self Care | Payer: Medicare Other | Source: Ambulatory Visit | Attending: Radiation Oncology | Admitting: Radiation Oncology

## 2016-04-23 DIAGNOSIS — G4700 Insomnia, unspecified: Secondary | ICD-10-CM | POA: Diagnosis not present

## 2016-04-23 DIAGNOSIS — Z87891 Personal history of nicotine dependence: Secondary | ICD-10-CM | POA: Diagnosis not present

## 2016-04-23 DIAGNOSIS — C61 Malignant neoplasm of prostate: Secondary | ICD-10-CM | POA: Diagnosis not present

## 2016-04-23 DIAGNOSIS — C7951 Secondary malignant neoplasm of bone: Secondary | ICD-10-CM | POA: Diagnosis not present

## 2016-04-24 ENCOUNTER — Ambulatory Visit
Admission: RE | Admit: 2016-04-24 | Discharge: 2016-04-24 | Disposition: A | Discharge status: Home / Self Care | Payer: Medicare Other | Source: Ambulatory Visit | Attending: Radiation Oncology | Admitting: Radiation Oncology

## 2016-04-24 DIAGNOSIS — G4700 Insomnia, unspecified: Secondary | ICD-10-CM | POA: Diagnosis not present

## 2016-04-24 DIAGNOSIS — C61 Malignant neoplasm of prostate: Secondary | ICD-10-CM | POA: Diagnosis not present

## 2016-04-24 DIAGNOSIS — Z87891 Personal history of nicotine dependence: Secondary | ICD-10-CM | POA: Diagnosis not present

## 2016-04-24 DIAGNOSIS — C7951 Secondary malignant neoplasm of bone: Secondary | ICD-10-CM | POA: Diagnosis not present

## 2016-04-25 ENCOUNTER — Ambulatory Visit
Admission: RE | Admit: 2016-04-25 | Discharge: 2016-04-25 | Disposition: A | Discharge status: Home / Self Care | Payer: Medicare Other | Source: Ambulatory Visit | Attending: Radiation Oncology | Admitting: Radiation Oncology

## 2016-04-25 ENCOUNTER — Encounter: Payer: Self-pay | Admitting: Radiation Oncology

## 2016-04-25 DIAGNOSIS — Z87891 Personal history of nicotine dependence: Secondary | ICD-10-CM | POA: Diagnosis not present

## 2016-04-25 DIAGNOSIS — C61 Malignant neoplasm of prostate: Secondary | ICD-10-CM | POA: Diagnosis not present

## 2016-04-25 DIAGNOSIS — C7951 Secondary malignant neoplasm of bone: Secondary | ICD-10-CM | POA: Diagnosis not present

## 2016-04-25 DIAGNOSIS — G4700 Insomnia, unspecified: Secondary | ICD-10-CM | POA: Diagnosis not present

## 2016-04-25 NOTE — Progress Notes (Signed)
Patient completed final radiation treatment. Unfortunately, patient left before being evaluated by the provider. Phoned patient. Patient denies any questions or needs. Patient understands he will receive a card in the mail with his one month follow up appointment date and time. Patient understands to contact this RN with any future needs.

## 2016-04-27 NOTE — Progress Notes (Signed)
  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   His pain improved  Plan: The patient has completed radiation treatment. The patient will return to radiation oncology clinic for routine followup in one month. I advised him to call or return sooner if he has any questions or concerns related to his recovery or treatment. ________________________________  Sheral Apley. Tammi Klippel, M.D.

## 2016-04-27 NOTE — Progress Notes (Signed)
  Radiation Oncology         [phone] ________________________________  Name: Dean Carlson MRN: [id number]  Date: 04/25/2016  DOB: 12/07/1960  Weekly Radiation Therapy Management    ICD-9-CM ICD-10-CM   1. Bone metastasis ([facility]) 198.5 C79.51     Current Dose: 30 Gy     Planned Dose:  30 Gy  Narrative . . . . . . . . The patient presents for the final under treatment assessment.                                           The patient has had some continuation of previously noted pain but, it is improved.                                 Set-up films were reviewed.                                 The chart was checked. Physical Findings. . . Weight essentially stable.  No significant changes. Impression . . . . . . . The patient tolerated radiation relatively well. Plan . . . . . . . . . . . . Complete radiation today as scheduled, and follow-up in one month. The patient was encouraged to call or return to the clinic in the interim for any worsening symptoms.  ________________________________  Sheral Apley Tammi Klippel, M.D.

## 2016-04-30 ENCOUNTER — Ambulatory Visit (INDEPENDENT_AMBULATORY_CARE_PROVIDER_SITE_OTHER): Payer: Medicare Other | Admitting: Infectious Disease

## 2016-04-30 ENCOUNTER — Encounter: Payer: Self-pay | Admitting: Infectious Disease

## 2016-04-30 VITALS — BP 128/84 | HR 102 | Temp 98.9°F | Ht 71.0 in | Wt 219.0 lb

## 2016-04-30 DIAGNOSIS — S76311D Strain of muscle, fascia and tendon of the posterior muscle group at thigh level, right thigh, subsequent encounter: Secondary | ICD-10-CM | POA: Diagnosis not present

## 2016-04-30 DIAGNOSIS — C61 Malignant neoplasm of prostate: Secondary | ICD-10-CM

## 2016-04-30 DIAGNOSIS — B20 Human immunodeficiency virus [HIV] disease: Secondary | ICD-10-CM

## 2016-04-30 DIAGNOSIS — C7951 Secondary malignant neoplasm of bone: Secondary | ICD-10-CM | POA: Diagnosis not present

## 2016-04-30 DIAGNOSIS — F4323 Adjustment disorder with mixed anxiety and depressed mood: Secondary | ICD-10-CM

## 2016-04-30 DIAGNOSIS — Z23 Encounter for immunization: Secondary | ICD-10-CM | POA: Diagnosis not present

## 2016-04-30 DIAGNOSIS — Z9181 History of falling: Secondary | ICD-10-CM | POA: Diagnosis not present

## 2016-04-30 LAB — COMPLETE METABOLIC PANEL WITH GFR
ALBUMIN: 4.2 g/dL (ref 3.6–5.1)
ALK PHOS: 197 U/L — AB (ref 40–115)
ALT: 16 U/L (ref 9–46)
AST: 16 U/L (ref 10–35)
BILIRUBIN TOTAL: 0.3 mg/dL (ref 0.2–1.2)
BUN: 11 mg/dL (ref 7–25)
CALCIUM: 9.7 mg/dL (ref 8.6–10.3)
CO2: 27 mmol/L (ref 20–31)
CREATININE: 0.83 mg/dL (ref 0.70–1.33)
Chloride: 102 mmol/L (ref 98–110)
GFR, Est African American: >89 mL/min (ref >=60)
GFR, Est Non African American: >89 mL/min (ref >=60)
Glucose, Bld: 135 mg/dL — ABNORMAL HIGH (ref 65–99)
POTASSIUM: 4.1 mmol/L (ref 3.5–5.3)
Sodium: 140 mmol/L (ref 135–146)
TOTAL PROTEIN: 7.5 g/dL (ref 6.1–8.1)

## 2016-04-30 LAB — CBC WITH DIFFERENTIAL/PLATELET
BASOS ABS: 0 {cells}/uL (ref 0–200)
Basophils Relative: 0 %
EOS PCT: 1 %
Eosinophils Absolute: 51 cells/uL (ref 15–500)
HCT: 39.2 % (ref 38.5–50.0)
Hemoglobin: 12.9 g/dL — ABNORMAL LOW (ref 13.2–17.1)
LYMPHS ABS: 1275 {cells}/uL (ref 850–3900)
LYMPHS PCT: 25 %
MCH: 29.5 pg (ref 27.0–33.0)
MCHC: 32.9 g/dL (ref 32.0–36.0)
MCV: 89.5 fL (ref 80.0–100.0)
MONOS PCT: 10 %
MPV: 10 fL (ref 7.5–12.5)
Monocytes Absolute: 510 cells/uL (ref 200–950)
NEUTROS PCT: 64 %
Neutro Abs: 3264 cells/uL (ref 1500–7800)
PLATELETS: 229 10*3/uL (ref 140–400)
RBC: 4.38 MIL/uL (ref 4.20–5.80)
RDW: 16.1 % — AB (ref 11.0–15.0)
WBC: 5.1 10*3/uL (ref 3.8–10.8)

## 2016-04-30 NOTE — Progress Notes (Signed)
Chief complaint:   follow-up for HIV and depression  Subjective:    Patient ID: Dean Carlson, male    DOB: [birth year], 56 y.o.   MRN: [id number]                                     [phone]                        [phone]        Dean Carlson is 56 year old man with HIV, prostate cancer that was diagnosed while he was on testosterone. He has been followed closely by urology and now by Oncology with Dr. Barbaraann Faster who has  Shane Crutch. We had checked drug-drug interactions and there was a significant one with EFV with Zytiga so we brought him in to change him a regimen that would not interact with Zytiga and changed him ultimately to [facility] in am and Tivicay in PM. His most recent chemotherapy has involved switch back to [place].  He has had perfect suppression of virus when checked in August and returns for labs today.     Past Medical History:  Diagnosis Date  . Adjustment disorder with mixed anxiety and depressed mood 05/09/2015  . Asthma    as a child  . Dizziness 05/09/2015  . Grief 05/09/2015  . HIV (human immunodeficiency virus infection) ([place])   . Insomnia 05/09/2015  . Prostate cancer ([place])   . Weight gain 05/09/2015    Past Surgical History:  Procedure Laterality Date  . COLONOSCOPY  05/13/2011   Procedure: COLONOSCOPY;  Surgeon: Jerene Bears, MD;  Location: [facility];  Service: Gastroenterology;  Laterality: N/A;  . PROSTATE BIOPSY  08/16/2012    Family History  Problem Relation Age of Onset  . Diabetes    . Cancer Neg Hx       Social History   Social History  . Marital status: Legally Separated    Spouse name: N/A  . Number of children: N/A  . Years of education: N/A   Occupational History  . permanant disability    Social History Main Topics  . Smoking status: Former Smoker    Packs/day: 1.00    Types: Cigarettes    Quit date: 03/15/2011  . Smokeless tobacco: Never Used  . Alcohol use No     Comment: occ  . Drug  use: No  . Sexual activity: Not Currently   Other Topics Concern  . None   Social History Narrative  . None    Allergies  Allergen Reactions  . Alka-Seltzer [Aspirin Effervescent] Other (See Comments)    asthma  . Excedrin Extra Strength [Aspirin-Acetaminophen-Caffeine] Other (See Comments)    REACTION: asthma     Current Outpatient Prescriptions:  .  abacavir-dolutegravir-lamiVUDine (TRIUMEQ) 600-50-300 MG tablet, Take 1 tablet by mouth daily., Disp: 30 tablet, Rfl: 11 .  CALCIUM PO, Take 1 tablet by mouth daily., Disp: , Rfl:  .  cyclobenzaprine (FLEXERIL) 10 MG tablet, Take 1 tablet (10 mg total) by mouth 2 (two) times daily as needed for muscle spasms., Disp: 30 tablet, Rfl: 0 .  dolutegravir (TIVICAY) 50 MG tablet, Take 1 tablet (50 mg total) by mouth at bedtime., Disp: 30 tablet, Rfl: 11 .  enzalutamide (XTANDI) 40 MG capsule, Take 4 capsules (160 mg total) by mouth daily., Disp: 120 capsule, Rfl: 0 .  leuprolide (LUPRON) 30  MG injection, Inject 30 mg into the muscle every 4 (four) months., Disp: , Rfl:  .  oxyCODONE-acetaminophen (PERCOCET/ROXICET) 5-325 MG tablet, Take 1-2 tablets by mouth every 6 (six) hours as needed for severe pain. 1 to 2 tabs PO q6hrs  PRN for pain, Disp: 60 tablet, Rfl: 0    Review of Systems  Constitutional: Negative for activity change, appetite change, chills, diaphoresis, fatigue, fever and unexpected weight change.  HENT: Negative for congestion, rhinorrhea, sinus pressure, sneezing, sore throat and trouble swallowing.   Eyes: Negative for photophobia and visual disturbance.  Respiratory: Negative for cough, chest tightness, shortness of breath, wheezing and stridor.   Cardiovascular: Negative for chest pain, palpitations and leg swelling.  Gastrointestinal: Positive for nausea. Negative for abdominal distention, abdominal pain, anal bleeding, blood in stool, constipation, diarrhea and vomiting.  Genitourinary: Negative for difficulty  urinating, dysuria, flank pain and hematuria.  Musculoskeletal: Negative for arthralgias, back pain, gait problem, joint swelling and myalgias.  Skin: Negative for color change, pallor, rash and wound.  Neurological: Negative for dizziness, tremors, weakness and light-headedness.  Hematological: Negative for adenopathy. Does not bruise/bleed easily.  Psychiatric/Behavioral: Positive for dysphoric mood. Negative for agitation, behavioral problems, confusion, decreased concentration and sleep disturbance.       Objective:   Physical Exam  Constitutional: He is oriented to person, place, and time. He appears well-developed and well-nourished.  HENT:  Head: Normocephalic and atraumatic.  Eyes: Conjunctivae and EOM are normal.  Neck: Normal range of motion. Neck supple.  Cardiovascular: Normal rate and regular rhythm.   Pulmonary/Chest: Effort normal. No respiratory distress. He has no wheezes.  Abdominal: Soft. He exhibits no distension.  Musculoskeletal: Normal range of motion. He exhibits no edema or tenderness.  Neurological: He is alert and oriented to person, place, and time.  Skin: Skin is warm and dry. No rash noted. No erythema. No pallor.  Psychiatric: Judgment and thought content normal. His mood appears anxious. He is agitated.          Assessment & Plan:   HIV: continue   [place] but will double check with ID pharmacy re drug drug interactions with his chemotherapy. Perhaps he could go back to STR. I also mentioned BIKTARVY but he said he did not want to be "experimented on" I explained that this med was just FDA approved and that while we do not have as [facility] experience with it vs TRIUMEQ for example it was non inferior in trials that led to its FDA approval this month   Prostate cancer: to followup with Dr. Barbaraann Faster. PSA up but not been on XTandi long and now starting XRT  Grief and depression: continue supportive care: he is concerned about being treated like a  "number" by some of his MDs. I assured him that is not at all anyones intent.  I spent greater than 25  minutes with the patient including greater than 50% of time in face to face counsel of the patient re his new ARV regimen, his prostate cancer his grief, anxiety, depression,   and in coordination of his care.

## 2016-05-01 ENCOUNTER — Other Ambulatory Visit: Payer: Self-pay | Admitting: Oncology

## 2016-05-01 LAB — T-HELPER CELL (CD4) - (RCID CLINIC ONLY)
CD4 % Helper T Cell: 23 % — ABNORMAL LOW (ref 33–55)
CD4 T Cell Abs: 310 /uL — ABNORMAL LOW (ref 400–2700)

## 2016-05-01 LAB — RPR

## 2016-05-01 MED FILL — XTANDI 40 MG CAPSULE: 40 | 30 days supply | Qty: 120 | Fill #0

## 2016-05-04 LAB — HIV RNA, RTPCR W/R GT (RTI, PI,INT): HIV-1 RNA, QN PCR: <1.3 Log copies/mL

## 2016-05-05 ENCOUNTER — Telehealth: Payer: Self-pay | Admitting: *Deleted

## 2016-05-05 NOTE — Telephone Encounter (Signed)
CALLED PATIENT TO INFORM THAT FU HAS BEEN MOVED TO 06-10-16 @ 2:30 PM, UNABLE TO LEAVE MESSAGE , MAILED APPT. CARD

## 2016-05-06 NOTE — Progress Notes (Signed)
[facility] Counseling Note  Pt cancelled appt on 3/7 at 3p. Counselor called to reschedule and lvm.  Westly Pam, [place] LP[facility] [phone]

## 2016-05-07 DIAGNOSIS — F4323 Adjustment disorder with mixed anxiety and depressed mood: Secondary | ICD-10-CM | POA: Diagnosis not present

## 2016-05-07 DIAGNOSIS — B20 Human immunodeficiency virus [HIV] disease: Secondary | ICD-10-CM | POA: Diagnosis not present

## 2016-05-07 DIAGNOSIS — C7951 Secondary malignant neoplasm of bone: Secondary | ICD-10-CM | POA: Diagnosis not present

## 2016-05-07 DIAGNOSIS — S76311D Strain of muscle, fascia and tendon of the posterior muscle group at thigh level, right thigh, subsequent encounter: Secondary | ICD-10-CM | POA: Diagnosis not present

## 2016-05-07 DIAGNOSIS — C61 Malignant neoplasm of prostate: Secondary | ICD-10-CM | POA: Diagnosis not present

## 2016-05-07 DIAGNOSIS — Z9181 History of falling: Secondary | ICD-10-CM | POA: Diagnosis not present

## 2016-05-14 MED FILL — TRIUMEQ 600-50-300 MG TABS: 600-50-300 | 30 days supply | Qty: 30 | Fill #2

## 2016-05-16 DIAGNOSIS — C61 Malignant neoplasm of prostate: Secondary | ICD-10-CM | POA: Diagnosis not present

## 2016-05-16 DIAGNOSIS — B20 Human immunodeficiency virus [HIV] disease: Secondary | ICD-10-CM | POA: Diagnosis not present

## 2016-05-16 DIAGNOSIS — Z9181 History of falling: Secondary | ICD-10-CM | POA: Diagnosis not present

## 2016-05-16 DIAGNOSIS — S76311D Strain of muscle, fascia and tendon of the posterior muscle group at thigh level, right thigh, subsequent encounter: Secondary | ICD-10-CM | POA: Diagnosis not present

## 2016-05-16 DIAGNOSIS — F4323 Adjustment disorder with mixed anxiety and depressed mood: Secondary | ICD-10-CM | POA: Diagnosis not present

## 2016-05-16 DIAGNOSIS — C7951 Secondary malignant neoplasm of bone: Secondary | ICD-10-CM | POA: Diagnosis not present

## 2016-05-16 NOTE — Progress Notes (Signed)
[facility] Counseling Note  LVM will pt, f/u on his interest to schedule an individual counseling appoint. Will f/u next week if no returned message.  Westly Pam, [place] [facility] [place]

## 2016-05-21 ENCOUNTER — Telehealth: Payer: Self-pay | Admitting: Oncology

## 2016-05-21 ENCOUNTER — Other Ambulatory Visit (HOSPITAL_BASED_OUTPATIENT_CLINIC_OR_DEPARTMENT_OTHER): Payer: Medicare Other

## 2016-05-21 ENCOUNTER — Ambulatory Visit (HOSPITAL_BASED_OUTPATIENT_CLINIC_OR_DEPARTMENT_OTHER): Payer: Medicare Other | Admitting: Oncology

## 2016-05-21 ENCOUNTER — Other Ambulatory Visit: Payer: Self-pay | Admitting: *Deleted

## 2016-05-21 VITALS — BP 144/84 | HR 91 | Temp 98.6°F | Resp 19 | Wt 222.7 lb

## 2016-05-21 DIAGNOSIS — B20 Human immunodeficiency virus [HIV] disease: Secondary | ICD-10-CM | POA: Diagnosis not present

## 2016-05-21 DIAGNOSIS — C778 Secondary and unspecified malignant neoplasm of lymph nodes of multiple regions: Secondary | ICD-10-CM | POA: Diagnosis not present

## 2016-05-21 DIAGNOSIS — M25551 Pain in right hip: Secondary | ICD-10-CM

## 2016-05-21 DIAGNOSIS — C61 Malignant neoplasm of prostate: Secondary | ICD-10-CM | POA: Diagnosis not present

## 2016-05-21 LAB — CBC WITH DIFFERENTIAL/PLATELET
BASO%: 0.9 % (ref 0.0–2.0)
Basophils Absolute: 0 10*3/uL (ref 0.0–0.1)
EOS ABS: 0.1 10*3/uL (ref 0.0–0.5)
EOS%: 1.9 % (ref 0.0–7.0)
HCT: 38.7 % (ref 38.4–49.9)
HGB: 12.8 g/dL — ABNORMAL LOW (ref 13.0–17.1)
LYMPH%: 31.2 % (ref 14.0–49.0)
MCH: 29.7 pg (ref 27.2–33.4)
MCHC: 33.1 g/dL (ref 32.0–36.0)
MCV: 89.7 fL (ref 79.3–98.0)
MONO#: 0.7 10*3/uL (ref 0.1–0.9)
MONO%: 12.8 % (ref 0.0–14.0)
NEUT#: 3 10*3/uL (ref 1.5–6.5)
NEUT%: 53.2 % (ref 39.0–75.0)
PLATELETS: 245 10*3/uL (ref 140–400)
RBC: 4.32 10*6/uL (ref 4.20–5.82)
RDW: 16.3 % — ABNORMAL HIGH (ref 11.0–14.6)
WBC: 5.7 10*3/uL (ref 4.0–10.3)
lymph#: 1.8 10*3/uL (ref 0.9–3.3)

## 2016-05-21 LAB — COMPREHENSIVE METABOLIC PANEL
ALT: 11 U/L (ref 0–55)
AST: 13 U/L (ref 5–34)
Albumin: 3.9 g/dL (ref 3.5–5.0)
Alkaline Phosphatase: 218 U/L — ABNORMAL HIGH (ref 40–150)
Anion Gap: 11 mEq/L (ref 3–11)
BUN: 11 mg/dL (ref 7.0–26.0)
CHLORIDE: 102 meq/L (ref 98–109)
CO2: 28 meq/L (ref 22–29)
Calcium: 9.7 mg/dL (ref 8.4–10.4)
Creatinine: 0.8 mg/dL (ref 0.7–1.3)
Glucose: 107 mg/dl (ref 70–140)
POTASSIUM: 3.9 meq/L (ref 3.5–5.1)
SODIUM: 140 meq/L (ref 136–145)
Total Bilirubin: <0.22 mg/dL (ref 0.20–1.20)
Total Protein: 8.1 g/dL (ref 6.4–8.3)

## 2016-05-21 MED ORDER — OXYCODONE-ACETAMINOPHEN 5-325 MG PO TABS: 1.0000 | ORAL_TABLET | Freq: Four times a day (QID) | ORAL | 0 refills | Status: DC | PRN

## 2016-05-21 NOTE — Progress Notes (Signed)
Thanks again for the update on Dean Carlson. I am glad he is taking medicine again and feeling better after XRT

## 2016-05-21 NOTE — Progress Notes (Signed)
Hematology and Oncology Follow Up Visit  Dean Carlson [id number] Mar 20, 1960 56 y.o. 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 He was diagnosed in 2014 where he had a Gleason score 4+5 = 9 and PSA of 230. He had disease including and to the periaortic lymph nodes measuring 3 cm.   Prior Therapy: He was treated with Lupron therapy at the time of diagnosis and had an excellent response with PSA down to 0.97 back in August 2015.  His PSA did rise to 18.76 in April 2016 and most recently up to 30.85 on 10/19/2014. At that time he was started on Xtandi and tolerated it poorly. He reported muscle cramps and took it for one week only.  He had been off any treatment other than Lupron since that time. His most recent bone scan was done in May 2016 showed a questionable L2 sclerotic lesion. Zytiga for a total dose of 1000 mg daily started on 02/15/2015. His dose was changed to 500 mg starting on 08/30/2015. Therapy discontinued in January 2018 because of progression of disease. Palliative radiation therapy to the right hip currently ongoing under the care of Dr. Tammi Carlson. Therapy concluded in February 2018 after receiving total of 30 gray to the right hip.  Current therapy:  Xtandi 160 mg daily started in January 2018.  Interim History: Dean Carlson presents today for a follow-up visit. Since his last visit, he completed radiation therapy with significant improvement in his hip pain. He is able to ambulate without any major difficulties and has not reported any falls or syncope. He does report needing Percocet periodically when his pain is exacerbated. His appetite remained excellent and have gained weight since last visit. He denied any new bone pain or pathological fractures. He still have a reasonable quality of life.  He continues to take Xtandi  without any major complications. He does not report any excessive fatigue or tiredness. He did report mild fatigue that is manageable. His wound appeared reasonably stable although he is coping with the loss of his ex-wife which have exacerbated the stress that he is going through with his illness. He feels better as of late and feels his mood is improving.  He does not report any headaches, blurry vision, syncope or seizures. Does not report any fevers, chills, sweats or weight loss. Does not report any chest pain, palpitation orthopnea or leg edema. Does not report any cough, hemoptysis or hematemesis. He does not report any early satiety. Does not report any skeletal complaints of arthralgias or myalgias. Remaining review of systems unremarkable.   Medications: No changes in his medication noted by my review. Current Outpatient Prescriptions  Medication Sig Dispense Refill  . abacavir-dolutegravir-lamiVUDine (TRIUMEQ) 600-50-300 MG tablet Take 1 tablet by mouth daily. 30 tablet 11  . CALCIUM PO Take 1 tablet by mouth daily.    . cyclobenzaprine (FLEXERIL) 10 MG tablet Take 1 tablet (10 mg total) by mouth 2 (two) times daily as needed for muscle spasms. 30 tablet 0  . dolutegravir (TIVICAY) 50 MG tablet Take 1 tablet (50 mg total) by mouth at bedtime. 30 tablet 11  . leuprolide (LUPRON) 30 MG injection Inject 30 mg into the muscle every 4 (four) months.    Dean Carlson 40 MG capsule TAKE 4 CAPSULES BY MOUTH DAILY 120 capsule 0  . oxyCODONE-acetaminophen (PERCOCET/ROXICET) 5-325 MG tablet Take 1-2 tablets by mouth  every 6 (six) hours as needed for severe pain. 1 to 2 tabs PO q6hrs  PRN for pain 60 tablet 0   No current facility-administered medications for this visit.      Allergies:  Allergies  Allergen Reactions  . Alka-Seltzer [Aspirin Effervescent] Other (See Comments)    asthma  . Excedrin Extra Strength [Aspirin-Acetaminophen-Caffeine] Other (See Comments)    REACTION: asthma    Past  Medical History, Surgical history, Social history, and Family History were reviewed and updated.   Physical Exam: Blood pressure (!) 144/84, pulse 91, temperature 98.6 F (37 C), temperature source Oral, resp. rate 19, weight 222 lb 11.2 oz (101 kg), SpO2 100 %. ECOG: 1 General appearance: Well-appearing gentleman without distress. Head: Normocephalic, without obvious abnormality no oral thrush. Neck: no adenopathy no thyroid masses. Lymph nodes: Cervical, supraclavicular, and axillary nodes normal. Heart:regular rate and rhythm, S1, S2 normal, no murmur, click, rub or gallop Lung:chest clear, no wheezing, rales, normal symmetric air entry Abdomin: soft, non-tender, without masses or organomegaly no rebound or guarding. EXT:no erythema, induration, or nodules   Lab Results: Lab Results  Component Value Date   WBC 5.7 05/21/2016   HGB 12.8 (L) 05/21/2016   HCT 38.7 05/21/2016   MCV 89.7 05/21/2016   PLT 245 05/21/2016     Chemistry      Component Value Date/Time   NA 140 04/30/2016 1055   NA 142 04/17/2016 1412   K 4.1 04/30/2016 1055   K 4.0 04/17/2016 1412   CL 102 04/30/2016 1055   CO2 27 04/30/2016 1055   CO2 27 04/17/2016 1412   BUN 11 04/30/2016 1055   BUN 10.1 04/17/2016 1412   CREATININE 0.83 04/30/2016 1055   CREATININE 0.8 04/17/2016 1412      Component Value Date/Time   CALCIUM 9.7 04/30/2016 1055   CALCIUM 9.7 04/17/2016 1412   ALKPHOS 197 (H) 04/30/2016 1055   ALKPHOS 229 (H) 04/17/2016 1412   AST 16 04/30/2016 1055   AST 14 04/17/2016 1412   ALT 16 04/30/2016 1055   ALT 16 04/17/2016 1412   BILITOT 0.3 04/30/2016 1055   BILITOT 0.37 04/17/2016 1412      Results for Dean Carlson (MRN [id number]) as of 05/21/2016 15:03  Ref. Range 03/06/2016 10:15 04/17/2016 14:12  PSA Latest Ref Range: 0.0 - 4.0 ng/mL 37.5 (H) 61.8 (H)    Impression and Plan:   56 year old gentleman with the following issues:  1. Castration resistant metastatic prostate cancer  with disease to the pelvic adenopathy. His initial diagnosis back in June 2014 where he presented with a Gleason score 4+5 = 9 and a PSA of 230. After a period of androgen deprivation and a PSA nadir of less than 1, he started developing a rapid rise in his PSA and have been intolerant to [place].  CT scan on January 2017 was reviewed which showed pelvic adenopathy and potential 1.9 cm sclerotic lesion at the L2 vertebral body.  He is status post Zytiga therapy that was discontinued because of progression of disease.  He is currently on Xtandi and have tolerated it well so far. His PSA was 61.8 in February 2018 although he has not taken [facility] for an extended period of time. The plan is to continue with the same dose and schedule of Xtandi and monitor his PSA. His PSA continues to rise rapidly, that his elbows therapy may be needed in the form of systemic chemotherapy.  2. Androgen depravation: I have recommended continuing that indefinitely.  3. HIV: His regimen switched by Dr. Tommy Carlson to accommodate [facility] therapy. No major complications noted.  4. LFT monitoring: His liver function tests on  on January 2018 were normal and these will be repeated today.  5. Electrolyte surveillance: His potassium and the rest of his electrolytes are within normal range.  6. Right hip pain: Improved after radiation therapy. Percocet was refilled for him today.  8. Follow-up: Will be in 5 weeks to follow his progress.   Zola Button, MD 3/21/20183:17 PM

## 2016-05-21 NOTE — Telephone Encounter (Signed)
Gave patient avs report and appointments for April  °

## 2016-05-22 LAB — PSA: Prostate Specific Ag, Serum: 41.3 ng/mL — ABNORMAL HIGH (ref 0.0–4.0)

## 2016-05-23 DIAGNOSIS — C61 Malignant neoplasm of prostate: Secondary | ICD-10-CM | POA: Diagnosis not present

## 2016-05-23 DIAGNOSIS — S76311D Strain of muscle, fascia and tendon of the posterior muscle group at thigh level, right thigh, subsequent encounter: Secondary | ICD-10-CM | POA: Diagnosis not present

## 2016-05-23 DIAGNOSIS — C7951 Secondary malignant neoplasm of bone: Secondary | ICD-10-CM | POA: Diagnosis not present

## 2016-05-23 DIAGNOSIS — F4323 Adjustment disorder with mixed anxiety and depressed mood: Secondary | ICD-10-CM | POA: Diagnosis not present

## 2016-05-23 DIAGNOSIS — Z9181 History of falling: Secondary | ICD-10-CM | POA: Diagnosis not present

## 2016-05-23 DIAGNOSIS — B20 Human immunodeficiency virus [HIV] disease: Secondary | ICD-10-CM | POA: Diagnosis not present

## 2016-05-29 ENCOUNTER — Other Ambulatory Visit: Payer: Self-pay | Admitting: Oncology

## 2016-05-29 ENCOUNTER — Encounter: Payer: Self-pay | Admitting: *Deleted

## 2016-05-29 MED FILL — XTANDI 40 MG CAPSULE: 40 | 30 days supply | Qty: 120 | Fill #0

## 2016-06-03 DIAGNOSIS — F4323 Adjustment disorder with mixed anxiety and depressed mood: Secondary | ICD-10-CM | POA: Diagnosis not present

## 2016-06-03 DIAGNOSIS — C7951 Secondary malignant neoplasm of bone: Secondary | ICD-10-CM | POA: Diagnosis not present

## 2016-06-03 DIAGNOSIS — Z9181 History of falling: Secondary | ICD-10-CM | POA: Diagnosis not present

## 2016-06-03 DIAGNOSIS — C61 Malignant neoplasm of prostate: Secondary | ICD-10-CM | POA: Diagnosis not present

## 2016-06-03 DIAGNOSIS — B20 Human immunodeficiency virus [HIV] disease: Secondary | ICD-10-CM | POA: Diagnosis not present

## 2016-06-03 DIAGNOSIS — S76311D Strain of muscle, fascia and tendon of the posterior muscle group at thigh level, right thigh, subsequent encounter: Secondary | ICD-10-CM | POA: Diagnosis not present

## 2016-06-03 NOTE — Progress Notes (Signed)
ames Choinski 56 year old gentleman with painful right ischial metastasis from castration resistant prostate cancer radiation completed 04-25-16 one month FU.    Pain: None Skin changes Normal  to right hip Numbness or weakness of extremities: Reports he still has right leg weakness, doing right leg strengthening exercises everyday which is helping. Fatigue:Having fatigue during the day. Appetite:Good eating three meals per day with snacks. Weight: Wt Readings from Last 3 Encounters:  06/10/16 231 lb 9.6 oz (105.1 kg)  05/21/16 222 lb 11.2 oz (101 kg)  04/30/16 219 lb (99.3 kg)   History of taking Lupron injection every 4 months thinks it is due in June 2018. 05-21-16 Saw Dr. Dan Maker 160 mg daily started in January 2018 discontinued because of progression of disease.  Will see in 5 weeks for follow up. BP 138/86   Pulse 97   Temp 98.4 F (36.9 C) (Oral)   Resp 18   Ht 5\' 11"  (1.803 m)   Wt 231 lb 9.6 oz (105.1 kg)   SpO2 100%   BMI 32.30 kg/m

## 2016-06-03 NOTE — Progress Notes (Deleted)
Dean Carlson 56 year old gentleman with painful right ischial metastasis from castration resistant prostate cancer radiation completed 04-25-16 one month FU.

## 2016-06-05 DIAGNOSIS — R102 Pelvic and perineal pain: Secondary | ICD-10-CM | POA: Diagnosis not present

## 2016-06-05 DIAGNOSIS — C61 Malignant neoplasm of prostate: Secondary | ICD-10-CM | POA: Diagnosis not present

## 2016-06-10 ENCOUNTER — Encounter: Payer: Self-pay | Admitting: Urology

## 2016-06-10 ENCOUNTER — Ambulatory Visit
Admission: RE | Admit: 2016-06-10 | Discharge: 2016-06-10 | Disposition: A | Discharge status: Home / Self Care | Payer: Medicare Other | Source: Ambulatory Visit | Attending: Urology | Admitting: Urology

## 2016-06-10 ENCOUNTER — Ambulatory Visit
Admission: RE | Admit: 2016-06-10 | Discharge: 2016-06-10 | Disposition: A | Discharge status: Home / Self Care | Payer: Medicare Other | Source: Ambulatory Visit | Attending: Radiation Oncology | Admitting: Radiation Oncology

## 2016-06-10 VITALS — BP 138/86 | HR 97 | Temp 98.4°F | Resp 18 | Ht 71.0 in | Wt 231.6 lb

## 2016-06-10 DIAGNOSIS — Z888 Allergy status to other drugs, medicaments and biological substances status: Secondary | ICD-10-CM | POA: Insufficient documentation

## 2016-06-10 DIAGNOSIS — Z79899 Other long term (current) drug therapy: Secondary | ICD-10-CM | POA: Diagnosis not present

## 2016-06-10 DIAGNOSIS — C61 Malignant neoplasm of prostate: Secondary | ICD-10-CM | POA: Insufficient documentation

## 2016-06-10 DIAGNOSIS — C7951 Secondary malignant neoplasm of bone: Secondary | ICD-10-CM

## 2016-06-10 MED FILL — TRIUMEQ 600-50-300 MG TABS: 600-50-300 | 30 days supply | Qty: 30 | Fill #3

## 2016-06-10 NOTE — Progress Notes (Signed)
Radiation Oncology         [phone] ________________________________  Name: Dean Carlson MRN: [id number]  Date: 06/10/2016  DOB: 1960/04/18  Post Treatment Note  CC: Alcide Evener, MD  Wyatt Portela, MD  Diagnosis:   Castration resistant prostate cancer with painful metastasis to the right ishium  Interval Since Last Radiation:  6 weeks  2/12-2/23/18:    Right ischium treated to 30 Gy in 10 fractions   Narrative:  The patient returns today for routine follow-up.  He tolerated radiotherapy very well with significant improvement in his pain.                             On review of systems, the patient states that he continues with mild weakness in the right lower extremity but his pain is much improved.  He only notices pain when sitting on hard surfaces for any period of time.  Otherwise, the pain is tolerable and he is ambulating with minimal discomfort.  He has not had any recent falls.  He has occasional numbness/tingling in his feet which has been ongoing for a long while and unchanged recently. He denies new bone pain.  He reports a good appetite and is maintaining his weight.  He denies fever, chills, N/V/D.  He remains on Xtandi 160mg  and Lupron for management of his advanced metastatic prostate cancer under the care of Dr. Alyson Ingles and Dr. Alen Blew.  He has mild fatigue but this is not interfering with his ADLs or ability to do things he enjoys. His next scheduled follow up with Dr. Alen Blew is 06/27/16.  ALLERGIES:  is allergic to alka-seltzer [aspirin effervescent] and excedrin extra strength [aspirin-acetaminophen-caffeine].  Meds: Current Outpatient Prescriptions  Medication Sig Dispense Refill  . abacavir-dolutegravir-lamiVUDine (TRIUMEQ) 600-50-300 MG tablet Take 1 tablet by mouth daily. 30 tablet 11  . CALCIUM PO Take 1 tablet by mouth daily.    . cyclobenzaprine (FLEXERIL) 10 MG tablet Take 1 tablet (10 mg total) by mouth 2 (two) times daily as needed for muscle  spasms. 30 tablet 0  . dolutegravir (TIVICAY) 50 MG tablet Take 1 tablet (50 mg total) by mouth at bedtime. 30 tablet 11  . leuprolide (LUPRON) 30 MG injection Inject 30 mg into the muscle every 4 (four) months.    Marland Kitchen oxyCODONE-acetaminophen (PERCOCET/ROXICET) 5-325 MG tablet Take 1-2 tablets by mouth every 6 (six) hours as needed for severe pain. 1 to 2 tabs PO q6hrs  PRN for pain 60 tablet 0  . XTANDI 40 MG capsule TAKE 4 CAPSULES BY MOUTH DAILY 120 capsule 0   No current facility-administered medications for this encounter.     Physical Findings:  height is 5\' 11"  (1.803 m) and weight is 231 lb 9.6 oz (105.1 kg). His oral temperature is 98.4 F (36.9 C). His blood pressure is 138/86 and his pulse is 97. His respiration is 18 and oxygen saturation is 100%.  Pain Assessment Pain Score: 0-No pain/10 In general this is a well appearing african [place] male in no acute distress. He has 5/5 strenght in the LEs, equal bilaterally and sensation intact bilaterally.  He's alert and oriented x4 and appropriate throughout the examination. Cardiopulmonary assessment is negative for acute distress and he exhibits normal effort.   Lab Findings: Lab Results  Component Value Date   WBC 5.7 05/21/2016   HGB 12.8 (L) 05/21/2016   HCT 38.7 05/21/2016   MCV 89.7 05/21/2016  PLT 245 05/21/2016     Radiographic Findings: No results found.  Impression/Plan: 1. Castration resistant prostate cancer with painful metastasis to the right ishium. Advised that we are happy to continue to follow him and participate in his care as needed.  He will continue his regular follow up with Dr. Alen Blew and Dr. Alyson Ingles for continued monitoring and treatment recommendations for disease management. He knos to call or contact us at any time with questions or concerns regarding his previous radiotherapy.     Nicholos Johns, PA-C

## 2016-06-10 NOTE — Addendum Note (Signed)
Encounter addended by: Malena Edman, RN on: 06/10/2016  4:06 PM<BR>    Actions taken: Charge Capture section accepted

## 2016-06-27 ENCOUNTER — Other Ambulatory Visit (HOSPITAL_BASED_OUTPATIENT_CLINIC_OR_DEPARTMENT_OTHER): Payer: Medicare Other

## 2016-06-27 ENCOUNTER — Telehealth: Payer: Self-pay | Admitting: Oncology

## 2016-06-27 ENCOUNTER — Ambulatory Visit (HOSPITAL_BASED_OUTPATIENT_CLINIC_OR_DEPARTMENT_OTHER): Payer: Medicare Other | Admitting: Oncology

## 2016-06-27 ENCOUNTER — Encounter: Payer: Self-pay | Admitting: Nutrition

## 2016-06-27 VITALS — BP 148/82 | HR 80 | Temp 98.8°F | Resp 18 | Ht 71.0 in | Wt 233.1 lb

## 2016-06-27 DIAGNOSIS — C61 Malignant neoplasm of prostate: Secondary | ICD-10-CM

## 2016-06-27 DIAGNOSIS — B20 Human immunodeficiency virus [HIV] disease: Secondary | ICD-10-CM

## 2016-06-27 DIAGNOSIS — C778 Secondary and unspecified malignant neoplasm of lymph nodes of multiple regions: Secondary | ICD-10-CM

## 2016-06-27 LAB — CBC WITH DIFFERENTIAL/PLATELET
BASO%: 0.3 % (ref 0.0–2.0)
BASOS ABS: 0 10*3/uL (ref 0.0–0.1)
EOS ABS: 0.1 10*3/uL (ref 0.0–0.5)
EOS%: 1.3 % (ref 0.0–7.0)
HCT: 33.7 % — ABNORMAL LOW (ref 38.4–49.9)
HEMOGLOBIN: 11.3 g/dL — AB (ref 13.0–17.1)
LYMPH%: 36.3 % (ref 14.0–49.0)
MCH: 30.7 pg (ref 27.2–33.4)
MCHC: 33.5 g/dL (ref 32.0–36.0)
MCV: 91.6 fL (ref 79.3–98.0)
MONO#: 0.8 10*3/uL (ref 0.1–0.9)
MONO%: 12.5 % (ref 0.0–14.0)
NEUT%: 49.6 % (ref 39.0–75.0)
NEUTROS ABS: 3 10*3/uL (ref 1.5–6.5)
PLATELETS: 187 10*3/uL (ref 140–400)
RBC: 3.68 10*6/uL — ABNORMAL LOW (ref 4.20–5.82)
RDW: 14.7 % — AB (ref 11.0–14.6)
WBC: 6 10*3/uL (ref 4.0–10.3)
lymph#: 2.2 10*3/uL (ref 0.9–3.3)

## 2016-06-27 LAB — COMPREHENSIVE METABOLIC PANEL
ALBUMIN: 3.6 g/dL (ref 3.5–5.0)
ALK PHOS: 184 U/L — AB (ref 40–150)
ALT: 9 U/L (ref 0–55)
ANION GAP: 8 meq/L (ref 3–11)
AST: 13 U/L (ref 5–34)
BUN: 12.6 mg/dL (ref 7.0–26.0)
CO2: 30 mEq/L — ABNORMAL HIGH (ref 22–29)
Calcium: 9.1 mg/dL (ref 8.4–10.4)
Chloride: 105 mEq/L (ref 98–109)
Creatinine: 0.8 mg/dL (ref 0.7–1.3)
EGFR: >90 mL/min/{1.73_m2} (ref >90)
Glucose: 108 mg/dl (ref 70–140)
POTASSIUM: 4 meq/L (ref 3.5–5.1)
Sodium: 143 mEq/L (ref 136–145)
TOTAL PROTEIN: 7.2 g/dL (ref 6.4–8.3)

## 2016-06-27 MED ORDER — CYCLOBENZAPRINE HCL 10 MG PO TABS: 10.0000 mg | ORAL_TABLET | Freq: Two times a day (BID) | ORAL | 0 refills | Status: DC | PRN

## 2016-06-27 NOTE — Progress Notes (Signed)
I provided second complimentary case of Ensure Plus.

## 2016-06-27 NOTE — Progress Notes (Signed)
Hematology and Oncology Follow Up Visit  Dean Carlson [id number] [birth year] 56 y.o. 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 He was diagnosed in 2014 where he had a Gleason score 4+5 = 9 and PSA of 230. He had disease including and to the periaortic lymph nodes measuring 3 cm.   Prior Therapy: He was treated with Lupron therapy at the time of diagnosis and had an excellent response with PSA down to 0.97 back in August 2015.  His PSA did rise to 18.76 in April 2016 and most recently up to 30.85 on 10/19/2014. At that time he was started on Xtandi and tolerated it poorly. He reported muscle cramps and took it for one week only.  He had been off any treatment other than Lupron since that time. His most recent bone scan was done in May 2016 showed a questionable L2 sclerotic lesion. Zytiga for a total dose of 1000 mg daily started on 02/15/2015. His dose was changed to 500 mg starting on 08/30/2015. Therapy discontinued in January 2018 because of progression of disease. Palliative radiation therapy to the right hip currently ongoing under the care of Dr. Tammi Klippel. Therapy concluded in February 2018 after receiving total of 30 gray to the right hip.  Current therapy:  Xtandi 160 mg daily started in January 2018.  Interim History: Dean Carlson presents today for a follow-up visit. Since his last visit, he reports no recent complaints. He is no longer reporting any right-sided hip pain or leg pain after completing radiation therapy. He is able to ambulate without any difficulties or falls. He continues to take Xtandi without any major complications. He does not report any excessive fatigue or tiredness. His appetite remains excellent and his weight is stable. His quality of life remained unchanged at this time.  He does not report any headaches,  blurry vision, syncope or seizures. Does not report any fevers, chills, sweats or weight loss. Does not report any chest pain, palpitation orthopnea or leg edema. Does not report any cough, hemoptysis or hematemesis. He does not report any early satiety. Does not report any skeletal complaints of arthralgias or myalgias. Remaining review of systems unremarkable.   Medications: No changes in his medication noted by my review. Current Outpatient Prescriptions  Medication Sig Dispense Refill  . abacavir-dolutegravir-lamiVUDine (TRIUMEQ) 600-50-300 MG tablet Take 1 tablet by mouth daily. 30 tablet 11  . CALCIUM PO Take 1 tablet by mouth daily.    . cyclobenzaprine (FLEXERIL) 10 MG tablet Take 1 tablet (10 mg total) by mouth 2 (two) times daily as needed for muscle spasms. 40 tablet 0  . cyclobenzaprine (FLEXERIL) 10 MG tablet Take 1 tablet (10 mg total) by mouth 2 (two) times daily as needed for muscle spasms. 30 tablet 0  . dolutegravir (TIVICAY) 50 MG tablet Take 1 tablet (50 mg total) by mouth at bedtime. 30 tablet 11  . leuprolide (LUPRON) 30 MG injection Inject 30 mg into the muscle every 4 (four) months.    Marland Kitchen oxyCODONE-acetaminophen (PERCOCET/ROXICET) 5-325 MG tablet Take 1-2 tablets by mouth every 6 (six) hours as needed for severe pain. 1 to 2 tabs PO q6hrs  PRN for pain 60 tablet 0  . XTANDI 40 MG capsule TAKE 4 CAPSULES BY MOUTH DAILY 120 capsule 0   No current facility-administered medications for this visit.      Allergies:  Allergies  Allergen Reactions  . Alka-Seltzer [Aspirin Effervescent] Other (See Comments)    asthma  . Excedrin Extra Strength [Aspirin-Acetaminophen-Caffeine] Other (See Comments)    REACTION: asthma    Past Medical History, Surgical history, Social history, and Family History were reviewed and updated.   Physical Exam: Blood pressure (!) 148/82, pulse 80, temperature 98.8 F (37.1 C), temperature source Oral, resp. rate 18, height 5\' 11"  (1.803 m),  weight 233 lb 1.6 oz (105.7 kg), SpO2 100 %. ECOG: 1 General appearance: Alert, awake gentleman without distress. Head: Normocephalic, without obvious abnormality no oral ulcers or lesions. Neck: no adenopathy no thyroid masses. Lymph nodes: Cervical, supraclavicular, and axillary nodes normal. Heart:regular rate and rhythm, S1, S2 normal, no murmur, click, rub or gallop Lung:chest clear, no wheezing, rales, normal symmetric air entry Abdomin: soft, non-tender, without masses or organomegaly no shifting dullness or ascites. EXT:no erythema, induration, or nodules   Lab Results: Lab Results  Component Value Date   WBC 6.0 06/27/2016   HGB 11.3 (L) 06/27/2016   HCT 33.7 (L) 06/27/2016   MCV 91.6 06/27/2016   PLT 187 06/27/2016     Chemistry      Component Value Date/Time   NA 140 05/21/2016 1455   K 3.9 05/21/2016 1455   CL 102 04/30/2016 1055   CO2 28 05/21/2016 1455   BUN 11.0 05/21/2016 1455   CREATININE 0.8 05/21/2016 1455      Component Value Date/Time   CALCIUM 9.7 05/21/2016 1455   ALKPHOS 218 (H) 05/21/2016 1455   AST 13 05/21/2016 1455   ALT 11 05/21/2016 1455   BILITOT <0.22 05/21/2016 1455       Results for Dean, Carlson (MRN [id number]) as of 06/27/2016 14:44  Ref. Range 04/17/2016 14:12 05/21/2016 14:55  PSA Latest Ref Range: 0.0 - 4.0 ng/mL 61.8 (H) 41.3 (H)    Impression and Plan:   56 year old gentleman with the following issues:  1. 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 His initial diagnosis back in June 2014 where he presented with a Gleason score 4+5 = 9 and a PSA of 230. After a period of androgen deprivation and a PSA nadir of less than 1, he started developing a rapid rise in his PSA and have been intolerant to [place].  CT scan on January 2017 was reviewed which showed pelvic adenopathy and potential 1.9 cm sclerotic lesion at the L2 vertebral body.  He is status post Zytiga therapy that was  discontinued because of progression of disease.  He is currently on Xtandi and have tolerated it well so far. His PSA did decrease in her last month to 41.3 from 75. His quality of life also improved and the plan is to continue with the same dose and schedule.  2. Androgen depravation: I have recommended continuing that indefinitely.  3. HIV: His regimen switched by Dr. Tommy Carlson to accommodate [facility] therapy.   4. LFT monitoring: His liver function tests in March 2018 were unchanged.  5. Electrolyte surveillance: His potassium and the rest of his electrolytes are within normal range.  6. Right hip pain: Resolved at this time.  7. Follow-up: Will be in 6 weeks to follow his progress.   Zola Button, MD 4/27/20183:40 PM

## 2016-06-27 NOTE — Telephone Encounter (Signed)
Appointments scheduled per 06/27/16 los. Patient was given a copy of the AVS report and appointment schedule per 06/27/16 los. °

## 2016-06-28 LAB — PSA: PROSTATE SPECIFIC AG, SERUM: 30.8 ng/mL — AB (ref 0.0–4.0)

## 2016-06-30 ENCOUNTER — Telehealth: Payer: Self-pay | Admitting: *Deleted

## 2016-06-30 NOTE — Telephone Encounter (Signed)
As noted below by Dr. Shadad, I informed patient of his PSA level. Patient verbalized understanding. 

## 2016-06-30 NOTE — Telephone Encounter (Signed)
-----   Message from Wyatt Portela, MD sent at 06/30/2016  8:06 AM EDT ----- Please let him know his PSA is down again.

## 2016-07-04 ENCOUNTER — Other Ambulatory Visit: Payer: Self-pay | Admitting: Oncology

## 2016-07-04 MED FILL — XTANDI 40 MG CAPSULE: 40 | 30 days supply | Qty: 120 | Fill #0

## 2016-07-07 MED FILL — TRIUMEQ 600-50-300 MG TABS: 600-50-300 | 30 days supply | Qty: 30 | Fill #4

## 2016-07-11 ENCOUNTER — Telehealth: Payer: Self-pay | Admitting: *Deleted

## 2016-07-11 ENCOUNTER — Encounter: Payer: Self-pay | Admitting: Pharmacist

## 2016-07-11 ENCOUNTER — Other Ambulatory Visit: Payer: Self-pay | Admitting: *Deleted

## 2016-07-11 MED ORDER — OXYCODONE-ACETAMINOPHEN 5-325 MG PO TABS: 1.0000 | ORAL_TABLET | Freq: Four times a day (QID) | ORAL | 0 refills | Status: DC | PRN

## 2016-07-11 NOTE — Telephone Encounter (Signed)
Per dr Alen Blew, okay to refill oxycodone script and if arm is still painful, he may go to an urgent care center for evaluation.

## 2016-07-11 NOTE — Telephone Encounter (Signed)
Patient calling to c/o pain in right arm and elbow. Unable to reach behind to his back and if leaning anything on his elbow, it is painful. Denies swelling. Would like  For arm to be x-rayed. States he is taking oxycodone 5-325 mg for the pain, but he will need a refill.

## 2016-07-11 NOTE — Progress Notes (Signed)
Called patient to see how he is doing.  He is on Triumeq in AM and Tivicay in PM to mitigate the drug interaction with Xtandi. Per [facility] records, he was only getting Triumeq and Xtandi filled.  Called him to verify and he is taking both Triumeq and Tivicay correctly.  He is getting Tivicay filled at [facility].  I offered 3 times to fill his Tivicay, Triumeq, and Xtandi all at [facility] and he refused saying he liked the way it was and not to change it.  I told him we don't like polypharmacy but he again refused to fill Tivicay at [facility].

## 2016-07-18 ENCOUNTER — Telehealth: Payer: Self-pay | Admitting: *Deleted

## 2016-07-18 NOTE — Telephone Encounter (Signed)
Returned patient's call regarding right arm . Patient stated,"my right arm won't move in certain directions and this has been going on for four days. I also have an ache on the back of my head." Instructed him to go to an Urgent Care to be evaluated and possibly needing an Xray. Patient stated,"I never heard of an Urgent Care. I'll go to one on Monday. Thank you mam."

## 2016-07-25 ENCOUNTER — Other Ambulatory Visit: Payer: Self-pay | Admitting: Oncology

## 2016-07-25 ENCOUNTER — Other Ambulatory Visit: Payer: Self-pay | Admitting: Infectious Disease

## 2016-07-25 DIAGNOSIS — B20 Human immunodeficiency virus [HIV] disease: Secondary | ICD-10-CM

## 2016-07-28 MED FILL — XTANDI 40 MG CAPSULE: 40 | 30 days supply | Qty: 120 | Fill #0

## 2016-07-31 MED FILL — TRIUMEQ 600-50-300 MG TABS: 600-50-300 | 30 days supply | Qty: 30 | Fill #5

## 2016-08-11 ENCOUNTER — Encounter (HOSPITAL_COMMUNITY): Payer: Self-pay | Admitting: Emergency Medicine

## 2016-08-11 ENCOUNTER — Ambulatory Visit (HOSPITAL_COMMUNITY)
Admission: EM | Admit: 2016-08-11 | Discharge: 2016-08-11 | Disposition: A | Discharge status: Home / Self Care | Payer: Medicare Other | Attending: Internal Medicine | Admitting: Internal Medicine

## 2016-08-11 DIAGNOSIS — M7701 Medial epicondylitis, right elbow: Secondary | ICD-10-CM

## 2016-08-11 MED ORDER — CELECOXIB 100 MG PO CAPS: 100.0000 mg | ORAL_CAPSULE | Freq: Two times a day (BID) | ORAL | 0 refills | Status: DC

## 2016-08-11 NOTE — ED Provider Notes (Signed)
CSN: [id number]     Arrival date & time 08/11/16  1226 History   First MD Initiated Contact with Patient 08/11/16 1408     Chief Complaint  Patient presents with  . Arm Pain   (Consider location/radiation/quality/duration/timing/severity/associated sxs/prior Treatment) 56 year old male presents with a one-week history of right elbow pain. Elbow pain is located to the medial epicondyle. He states that it is painful to use the mouse of the computer. This seems to exacerbate the pain. He states that it is difficult to completely straighten the arm secondary to pain in pulling on the inside of his elbow. No known injury. No fall or blunt trauma. He states that he is using a mouse on the computer for several hours a day.      Past Medical History:  Diagnosis Date  . Adjustment disorder with mixed anxiety and depressed mood 05/09/2015  . Asthma    as a child  . Dizziness 05/09/2015  . Grief 05/09/2015  . HIV (human immunodeficiency virus infection) ([place])   . Insomnia 05/09/2015  . Prostate cancer ([place])   . Weight gain 05/09/2015   Past Surgical History:  Procedure Laterality Date  . COLONOSCOPY  05/13/2011   Procedure: COLONOSCOPY;  Surgeon: Jerene Bears, MD;  Location: [facility];  Service: Gastroenterology;  Laterality: N/A;  . PROSTATE BIOPSY  08/16/2012   Family History  Problem Relation Age of Onset  . Diabetes Unknown   . Cancer Neg Hx    Social History  Substance Use Topics  . Smoking status: Former Smoker    Packs/day: 1.00    Types: Cigarettes    Quit date: 03/15/2011  . Smokeless tobacco: Never Used  . Alcohol use No     Comment: occ    Review of Systems  Constitutional: Positive for activity change. Negative for fever.  HENT: Negative.   Respiratory: Negative.   Gastrointestinal: Negative.   Genitourinary: Negative.   Musculoskeletal:       As per HPI  Skin: Negative.   Neurological: Negative for dizziness, weakness, numbness and headaches.  All other systems  reviewed and are negative.   Allergies  Alka-seltzer [aspirin effervescent] and Excedrin extra strength [aspirin-acetaminophen-caffeine]  Home Medications   Prior to Admission medications   Medication Sig Start Date End Date Taking? Authorizing Provider  abacavir-dolutegravir-lamiVUDine (TRIUMEQ) 600-50-300 MG tablet Take 1 tablet by mouth daily. 03/17/16   Truman Hayward, MD  CALCIUM PO Take 1 tablet by mouth daily.    [provider]  celecoxib (CELEBREX) 100 MG capsule Take 1 capsule (100 mg total) by mouth 2 (two) times daily. 08/11/16   Janne Napoleon, NP  cyclobenzaprine (FLEXERIL) 10 MG tablet Take 1 tablet (10 mg total) by mouth 2 (two) times daily as needed for muscle spasms. 06/27/16   Wyatt Portela, MD  cyclobenzaprine (FLEXERIL) 10 MG tablet Take 1 tablet (10 mg total) by mouth 2 (two) times daily as needed for muscle spasms. 06/27/16   Wyatt Portela, MD  leuprolide (LUPRON) 30 MG injection Inject 30 mg into the muscle every 4 (four) months.    [provider]  oxyCODONE-acetaminophen (PERCOCET/ROXICET) 5-325 MG tablet Take 1-2 tablets by mouth every 6 (six) hours as needed for severe pain. 1 to 2 tabs PO q6hrs  PRN for pain 07/11/16   Wyatt Portela, MD  TIVICAY 50 MG tablet TAKE 1 TABLET BY MOUTH EVERY DAY 07/29/16   [name], [name], MD  XTANDI 40 MG capsule TAKE 4  CAPSULES BY MOUTH DAILY 07/25/16   Wyatt Portela, MD   Meds Ordered and Administered this Visit  Medications - No data to display  BP 136/87 (BP Location: Left Arm)   Pulse (!) 114   Temp 98.6 F (37 C) (Oral)   Resp 18   SpO2 100%  No data found.   Physical Exam  Constitutional: He is oriented to person, place, and time. He appears well-developed and well-nourished. No distress.  Eyes: EOM are normal.  Neck: Neck supple.  Pulmonary/Chest: Effort normal.  Musculoskeletal: He exhibits tenderness. He exhibits no edema or deformity.  Tenderness directly over the right medial  epicondyle. After getting the patient flex and extend the wrist, move the digits, make a fist will exacerbate the pain in the medial elbow. No swelling or discoloration.  Neurological: He is alert and oriented to person, place, and time.  Skin: Skin is warm and dry.  Psychiatric: He has a normal mood and affect.  Nursing note and vitals reviewed.   Urgent Care Course     Procedures (including critical care time)  Labs Review Labs Reviewed - No data to display  Imaging Review No results found.   Visual Acuity Review  Right Eye Distance:   Left Eye Distance:   Bilateral Distance:    Right Eye Near:   Left Eye Near:    Bilateral Near:         MDM   1. Medial epicondylitis of elbow, right    Pt st he has been able to take ibuprofen and aleve without allergic reactions. Wear the wrap for about 3 days. He may remove it periodically to shower and to move the elbow back and forth so it does not get stiff. Limit use of the computer keyboard and mouse since that seems to be causing most of the symptoms. Apply ice to the area of pain off and on for 5 times a day. If you notice any increase in wheeze, rash or other signs of allergy after taking the Celebrex stopped taking it altogether. Meds ordered this encounter  Medications  . celecoxib (CELEBREX) 100 MG capsule    Sig: Take 1 capsule (100 mg total) by mouth 2 (two) times daily.    Dispense:  15 capsule    Refill:  0    Order Specific Question:   Supervising Provider    Answer:   Sherlene Shams [[id number]]       Janne Napoleon, NP 08/11/16 1439

## 2016-08-11 NOTE — Discharge Instructions (Signed)
Wear the wrap for about 3 days. He may remove it periodically to shower and to move the elbow back and forth so it does not get stiff. Limit use of the computer keyboard and mouse since that seems to be causing most of the symptoms. Apply ice to the area of pain off and on for 5 times a day. If you notice any increase in wheeze, rash or other signs of allergy after taking the Celebrex stopped taking it altogether.

## 2016-08-11 NOTE — ED Triage Notes (Signed)
The patient presented to the [facility] with a complaint of right elbow pain and burning x 1 week. The patient denied any known injury.

## 2016-08-15 ENCOUNTER — Other Ambulatory Visit (HOSPITAL_BASED_OUTPATIENT_CLINIC_OR_DEPARTMENT_OTHER): Payer: Medicare Other

## 2016-08-15 ENCOUNTER — Telehealth: Payer: Self-pay | Admitting: Oncology

## 2016-08-15 ENCOUNTER — Encounter: Payer: Self-pay | Admitting: Nutrition

## 2016-08-15 ENCOUNTER — Ambulatory Visit (HOSPITAL_BASED_OUTPATIENT_CLINIC_OR_DEPARTMENT_OTHER): Payer: Medicare Other | Admitting: Oncology

## 2016-08-15 VITALS — BP 158/92 | HR 95 | Temp 99.5°F | Resp 18 | Ht 71.0 in | Wt 228.9 lb

## 2016-08-15 DIAGNOSIS — C61 Malignant neoplasm of prostate: Secondary | ICD-10-CM | POA: Diagnosis not present

## 2016-08-15 DIAGNOSIS — C778 Secondary and unspecified malignant neoplasm of lymph nodes of multiple regions: Secondary | ICD-10-CM | POA: Diagnosis not present

## 2016-08-15 DIAGNOSIS — M899 Disorder of bone, unspecified: Secondary | ICD-10-CM | POA: Diagnosis not present

## 2016-08-15 LAB — COMPREHENSIVE METABOLIC PANEL
ALBUMIN: 3.5 g/dL (ref 3.5–5.0)
ALK PHOS: 230 U/L — AB (ref 40–150)
ALT: 12 U/L (ref 0–55)
ANION GAP: 9 meq/L (ref 3–11)
AST: 15 U/L (ref 5–34)
BILIRUBIN TOTAL: 0.22 mg/dL (ref 0.20–1.20)
BUN: 11.9 mg/dL (ref 7.0–26.0)
CALCIUM: 9.3 mg/dL (ref 8.4–10.4)
CO2: 28 mEq/L (ref 22–29)
CREATININE: 0.9 mg/dL (ref 0.7–1.3)
Chloride: 104 mEq/L (ref 98–109)
Glucose: 96 mg/dl (ref 70–140)
Potassium: 3.7 mEq/L (ref 3.5–5.1)
Sodium: 141 mEq/L (ref 136–145)
Total Protein: 7.7 g/dL (ref 6.4–8.3)

## 2016-08-15 LAB — CBC WITH DIFFERENTIAL/PLATELET
BASO%: 1 % (ref 0.0–2.0)
Basophils Absolute: 0.1 10*3/uL (ref 0.0–0.1)
EOS ABS: 0.1 10*3/uL (ref 0.0–0.5)
EOS%: 1.6 % (ref 0.0–7.0)
HEMATOCRIT: 36.3 % — AB (ref 38.4–49.9)
HGB: 12.1 g/dL — ABNORMAL LOW (ref 13.0–17.1)
LYMPH#: 1.6 10*3/uL (ref 0.9–3.3)
LYMPH%: 28.5 % (ref 14.0–49.0)
MCH: 31.2 pg (ref 27.2–33.4)
MCHC: 33.4 g/dL (ref 32.0–36.0)
MCV: 93.4 fL (ref 79.3–98.0)
MONO#: 0.5 10*3/uL (ref 0.1–0.9)
MONO%: 9.6 % (ref 0.0–14.0)
NEUT%: 59.3 % (ref 39.0–75.0)
NEUTROS ABS: 3.4 10*3/uL (ref 1.5–6.5)
PLATELETS: 250 10*3/uL (ref 140–400)
RBC: 3.89 10*6/uL — ABNORMAL LOW (ref 4.20–5.82)
RDW: 14 % (ref 11.0–14.6)
WBC: 5.7 10*3/uL (ref 4.0–10.3)

## 2016-08-15 NOTE — Progress Notes (Signed)
Hematology and Oncology Follow Up Visit  Dean Carlson [id number] Jun 14, 1960 56 y.o. 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 He was diagnosed in 2014 where he had a Gleason score 4+5 = 9 and PSA of 230. He had disease including and to the periaortic lymph nodes measuring 3 cm.   Prior Therapy: He was treated with Lupron therapy at the time of diagnosis and had an excellent response with PSA down to 0.97 back in August 2015.  His PSA did rise to 18.76 in April 2016 and most recently up to 30.85 on 10/19/2014. At that time he was started on Xtandi and tolerated it poorly. He reported muscle cramps and took it for one week only.  He had been off any treatment other than Lupron since that time. His most recent bone scan was done in May 2016 showed a questionable L2 sclerotic lesion. Zytiga for a total dose of 1000 mg daily started on 02/15/2015. His dose was changed to 500 mg starting on 08/30/2015. Therapy discontinued in January 2018 because of progression of disease. Palliative radiation therapy to the right hip currently ongoing under the care of Dr. Tammi Klippel. Therapy concluded in February 2018 after receiving total of 30 gray to the right hip.  Current therapy:  Xtandi 160 mg daily started in January 2018.  Interim History: Dean Carlson presents today for a follow-up visit. Since his last visit, he reports overall improvement in his health. He is no longer reporting any pain including his back, hips or shoulders. He is able to ambulate without any difficulties or falls. He continues to take Xtandi without any major complications. He does not report any excessive fatigue or tiredness. His appetite remains excellent and his weight is stable. His quality of life remained unchanged at this time. He does report some stomach discomfort and  occasional nausea associated with his HIV medication.  He does not report any headaches, blurry vision, syncope or seizures. Does not report any fevers, chills, sweats or weight loss. Does not report any chest pain, palpitation orthopnea or leg edema. Does not report any cough, hemoptysis or hematemesis. He does not report any early satiety. Does not report any skeletal complaints of arthralgias or myalgias. Remaining review of systems unremarkable.   Medications: No changes in his medication noted by my review. Current Outpatient Prescriptions  Medication Sig Dispense Refill  . abacavir-dolutegravir-lamiVUDine (TRIUMEQ) 600-50-300 MG tablet Take 1 tablet by mouth daily. 30 tablet 11  . CALCIUM PO Take 1 tablet by mouth daily.    . celecoxib (CELEBREX) 100 MG capsule Take 1 capsule (100 mg total) by mouth 2 (two) times daily. 15 capsule 0  . celecoxib (CELEBREX) 100 MG capsule Take 1 capsule (100 mg total) by mouth 2 (two) times daily. 15 capsule 0  . cyclobenzaprine (FLEXERIL) 10 MG tablet Take 1 tablet (10 mg total) by mouth 2 (two) times daily as needed for muscle spasms. 40 tablet 0  . cyclobenzaprine (FLEXERIL) 10 MG tablet Take 1 tablet (10 mg total) by mouth 2 (two) times daily as needed for muscle spasms. 30 tablet 0  . leuprolide (LUPRON) 30 MG injection Inject 30 mg into the muscle every 4 (four) months.    Marland Kitchen oxyCODONE-acetaminophen (PERCOCET/ROXICET) 5-325 MG tablet Take 1-2 tablets by mouth every 6 (six) hours as needed for severe pain. 1 to 2 tabs PO q6hrs  PRN  for pain 60 tablet 0  . TIVICAY 50 MG tablet TAKE 1 TABLET BY MOUTH EVERY DAY 30 tablet 0  . XTANDI 40 MG capsule TAKE 4 CAPSULES BY MOUTH DAILY 120 capsule 0   No current facility-administered medications for this visit.      Allergies:  Allergies  Allergen Reactions  . Alka-Seltzer [Aspirin Effervescent] Other (See Comments)    asthma  . Excedrin Extra Strength [Aspirin-Acetaminophen-Caffeine] Other (See Comments)     REACTION: asthma    Past Medical History, Surgical history, Social history, and Family History were reviewed and updated.   Physical Exam: Blood pressure (!) 158/92, pulse 95, temperature 99.5 F (37.5 C), temperature source Oral, resp. rate 18, height 5\' 11"  (1.803 m), weight 228 lb 14.4 oz (103.8 kg), SpO2 100 %. ECOG: 1 General appearance: Alert, awake gentleman appeared without distress today. Head: Normocephalic, without obvious abnormality no oral thrush or ulcers. Neck: no adenopathy no thyroid masses. Lymph nodes: Cervical, supraclavicular, and axillary nodes normal. Heart:regular rate and rhythm, S1, S2 normal, no murmur, click, rub or gallop Lung:chest clear, no wheezing, rales, normal symmetric air entry Abdomin: soft, non-tender, without masses or organomegaly no rebound or guarding. EXT:no erythema, induration, or nodules   Lab Results: Lab Results  Component Value Date   WBC 5.7 08/15/2016   HGB 12.1 (L) 08/15/2016   HCT 36.3 (L) 08/15/2016   MCV 93.4 08/15/2016   PLT 250 08/15/2016     Chemistry      Component Value Date/Time   NA 143 06/27/2016 1507   K 4.0 06/27/2016 1507   CL 102 04/30/2016 1055   CO2 30 (H) 06/27/2016 1507   BUN 12.6 06/27/2016 1507   CREATININE 0.8 06/27/2016 1507      Component Value Date/Time   CALCIUM 9.1 06/27/2016 1507   ALKPHOS 184 (H) 06/27/2016 1507   AST 13 06/27/2016 1507   ALT 9 06/27/2016 1507   BILITOT <0.22 06/27/2016 1507      Results for Dean Carlson, Dean Carlson (MRN [id number]) as of 08/15/2016 13:44  Ref. Range 05/21/2016 14:55 06/27/2016 15:07  PSA Latest Ref Range: 0.0 - 4.0 ng/mL 41.3 (H) 30.8 (H)     Impression and Plan:   56 year old gentleman with the following issues:  1. 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 His initial diagnosis back in June 2014 where he presented with a Gleason score 4+5 = 9 and a PSA of 230. After a period of androgen deprivation and a PSA  nadir of less than 1, he started developing a rapid rise in his PSA and have been intolerant to [place].  CT scan on January 2017 was reviewed which showed pelvic adenopathy and potential 1.9 cm sclerotic lesion at the L2 vertebral body.  He is status post Zytiga therapy that was discontinued because of progression of disease.  He is currently on Xtandi and have tolerated it well so far. His PSA continues to decline slowly without any major complications related to [facility]. The plan is to continue the same dose and schedule for the time being.  2. Androgen depravation: This to be continued indefinitely.  3. HIV: His regimen switched by Dr. Tommy Carlson to accommodate [facility] therapy.   4. LFT monitoring: His liver function tests in March 2018 were unchanged.  5. Electrolyte surveillance: His potassium and the rest of his electrolytes are within normal range.  6. Right hip pain: Resolved after radiation therapy.  7. Follow-up: Will be in 6 weeks to follow  his progress.   [facility], MD 6/15/20182:21 PM

## 2016-08-15 NOTE — Telephone Encounter (Signed)
Scheduled appt er 6/15 los - Gave patient AVS and calender - per patient wants appt after 8/3

## 2016-08-15 NOTE — Progress Notes (Signed)
I provided patient with third and final complementary case of Ensure Plus.

## 2016-08-16 LAB — PSA: PROSTATE SPECIFIC AG, SERUM: 62.6 ng/mL — AB (ref 0.0–4.0)

## 2016-08-21 ENCOUNTER — Other Ambulatory Visit: Payer: Self-pay | Admitting: Oncology

## 2016-08-21 ENCOUNTER — Encounter: Payer: Self-pay | Admitting: *Deleted

## 2016-08-21 DIAGNOSIS — R102 Pelvic and perineal pain: Secondary | ICD-10-CM | POA: Diagnosis not present

## 2016-08-21 DIAGNOSIS — C61 Malignant neoplasm of prostate: Secondary | ICD-10-CM | POA: Diagnosis not present

## 2016-08-26 MED FILL — XTANDI 40 MG CAPSULE: 40 | 30 days supply | Qty: 120 | Fill #0

## 2016-08-26 MED FILL — TRIUMEQ 600-50-300 MG TABS: 600-50-300 | 30 days supply | Qty: 30 | Fill #6

## 2016-09-01 ENCOUNTER — Other Ambulatory Visit: Payer: Self-pay | Admitting: Infectious Disease

## 2016-09-01 DIAGNOSIS — B20 Human immunodeficiency virus [HIV] disease: Secondary | ICD-10-CM

## 2016-09-04 ENCOUNTER — Encounter: Payer: Self-pay | Admitting: *Deleted

## 2016-09-04 ENCOUNTER — Telehealth: Payer: Self-pay | Admitting: *Deleted

## 2016-09-04 NOTE — Telephone Encounter (Signed)
"  Naomie Dean with [facility] [phone]) calling to check the status of fax we sent August 13, 2016.  We sent out a new request for more personal health care hours for this patient. "  No original orders noted in EPIC chart review by this nurse.  Vickie unable to tell who original ordering provider is.  Unable to authorize.  Vickie aware this nurse will leave request for provider who returns 09-16-2016.

## 2016-09-05 ENCOUNTER — Encounter: Payer: Self-pay | Admitting: Infectious Disease

## 2016-09-05 ENCOUNTER — Telehealth: Payer: Self-pay

## 2016-09-05 DIAGNOSIS — R413 Other amnesia: Secondary | ICD-10-CM

## 2016-09-05 HISTORY — DX: Other amnesia: R41.3

## 2016-09-05 NOTE — Telephone Encounter (Signed)
Pt had forms sent over from health and human services to initiate in home care. Upon receiving forms there were a few questions that need clarification. Called Pt to ask him what year he was first Dx with HIV after going through his chart and noticing there was no date or year. Pt stated that he wasn't sure of the month or date but believes it was sometime in 1994. He states that it's hard for him to remember things and it's also hard to from to get up due to often feeling dizzy and being partially blind. State he has a walker to help him get around but says it's still a bit of a challenge. He has problems cooking and clothing himself as well. I wrote note of our encounter and documented everything accordingly along with provider. Completed form sent over 09/05/2016 at 0900. I told the Pt to feel free to call the office for any updates or if he needs to reach out for any other reason.

## 2016-09-18 ENCOUNTER — Other Ambulatory Visit: Payer: Self-pay | Admitting: Oncology

## 2016-09-23 MED FILL — TRIUMEQ 600-50-300 MG TABS: 600-50-300 | 30 days supply | Qty: 30 | Fill #7

## 2016-09-23 MED FILL — XTANDI 40 MG CAPSULE: 40 | 30 days supply | Qty: 120 | Fill #0

## 2016-10-06 ENCOUNTER — Other Ambulatory Visit: Payer: Medicare Other

## 2016-10-06 DIAGNOSIS — B20 Human immunodeficiency virus [HIV] disease: Secondary | ICD-10-CM

## 2016-10-06 DIAGNOSIS — Z79899 Other long term (current) drug therapy: Secondary | ICD-10-CM | POA: Diagnosis not present

## 2016-10-06 LAB — COMPLETE METABOLIC PANEL WITH GFR
ALK PHOS: 280 U/L — AB (ref 40–115)
ALT: 10 U/L (ref 9–46)
AST: 12 U/L (ref 10–35)
Albumin: 3.8 g/dL (ref 3.6–5.1)
BILIRUBIN TOTAL: 0.2 mg/dL (ref 0.2–1.2)
BUN: 7 mg/dL (ref 7–25)
CALCIUM: 9.3 mg/dL (ref 8.6–10.3)
CO2: 30 mmol/L (ref 20–32)
Chloride: 100 mmol/L (ref 98–110)
Creat: 0.67 mg/dL — ABNORMAL LOW (ref 0.70–1.33)
Glucose, Bld: 123 mg/dL — ABNORMAL HIGH (ref 65–99)
Potassium: 3.8 mmol/L (ref 3.5–5.3)
Sodium: 142 mmol/L (ref 135–146)
TOTAL PROTEIN: 6.7 g/dL (ref 6.1–8.1)

## 2016-10-06 LAB — CBC WITH DIFFERENTIAL/PLATELET
BASOS ABS: 0 {cells}/uL (ref 0–200)
BASOS PCT: 0 %
EOS ABS: 116 {cells}/uL (ref 15–500)
Eosinophils Relative: 2 %
HEMATOCRIT: 35.8 % — AB (ref 38.5–50.0)
Hemoglobin: 11.7 g/dL — ABNORMAL LOW (ref 13.2–17.1)
LYMPHS PCT: 38 %
Lymphs Abs: 2204 cells/uL (ref 850–3900)
MCH: 30.6 pg (ref 27.0–33.0)
MCHC: 32.7 g/dL (ref 32.0–36.0)
MCV: 93.7 fL (ref 80.0–100.0)
MONO ABS: 696 {cells}/uL (ref 200–950)
MONOS PCT: 12 %
MPV: 10.1 fL (ref 7.5–12.5)
Neutro Abs: 2784 cells/uL (ref 1500–7800)
Neutrophils Relative %: 48 %
PLATELETS: 283 10*3/uL (ref 140–400)
RBC: 3.82 MIL/uL — ABNORMAL LOW (ref 4.20–5.80)
RDW: 14.9 % (ref 11.0–15.0)
WBC: 5.8 10*3/uL (ref 3.8–10.8)

## 2016-10-06 LAB — LIPID PANEL
CHOL/HDL RATIO: 3.6 ratio (ref <5.0)
Cholesterol: 148 mg/dL (ref <200)
HDL: 41 mg/dL (ref >40)
LDL CALC: 78 mg/dL (ref <100)
Triglycerides: 147 mg/dL (ref <150)
VLDL: 29 mg/dL (ref <30)

## 2016-10-07 LAB — T-HELPER CELL (CD4) - (RCID CLINIC ONLY)
CD4 % Helper T Cell: 20 % — ABNORMAL LOW (ref 33–55)
CD4 T CELL ABS: 490 /uL (ref 400–2700)

## 2016-10-07 LAB — RPR

## 2016-10-08 LAB — HIV-1 RNA QUANT-NO REFLEX-BLD
HIV 1 RNA QUANT: NOT DETECTED {copies}/mL
HIV-1 RNA Quant, Log: <1.3 Log copies/mL

## 2016-10-10 ENCOUNTER — Other Ambulatory Visit (HOSPITAL_BASED_OUTPATIENT_CLINIC_OR_DEPARTMENT_OTHER): Payer: Medicare Other

## 2016-10-10 ENCOUNTER — Other Ambulatory Visit: Payer: Self-pay | Admitting: Pharmacist

## 2016-10-10 ENCOUNTER — Telehealth: Payer: Self-pay | Admitting: Oncology

## 2016-10-10 ENCOUNTER — Ambulatory Visit (HOSPITAL_BASED_OUTPATIENT_CLINIC_OR_DEPARTMENT_OTHER): Payer: Medicare Other | Admitting: Oncology

## 2016-10-10 VITALS — BP 126/73 | HR 91 | Temp 98.7°F | Resp 19 | Ht 71.0 in | Wt 227.5 lb

## 2016-10-10 DIAGNOSIS — M899 Disorder of bone, unspecified: Secondary | ICD-10-CM

## 2016-10-10 DIAGNOSIS — C778 Secondary and unspecified malignant neoplasm of lymph nodes of multiple regions: Secondary | ICD-10-CM

## 2016-10-10 DIAGNOSIS — C61 Malignant neoplasm of prostate: Secondary | ICD-10-CM

## 2016-10-10 DIAGNOSIS — C7951 Secondary malignant neoplasm of bone: Principal | ICD-10-CM

## 2016-10-10 LAB — CBC WITH DIFFERENTIAL/PLATELET
BASO%: 0.2 % (ref 0.0–2.0)
BASOS ABS: 0 10*3/uL (ref 0.0–0.1)
EOS ABS: 0.1 10*3/uL (ref 0.0–0.5)
EOS%: 1.8 % (ref 0.0–7.0)
HCT: 36.9 % — ABNORMAL LOW (ref 38.4–49.9)
HGB: 12.2 g/dL — ABNORMAL LOW (ref 13.0–17.1)
LYMPH%: 31.4 % (ref 14.0–49.0)
MCH: 30.5 pg (ref 27.2–33.4)
MCHC: 33.1 g/dL (ref 32.0–36.0)
MCV: 92.3 fL (ref 79.3–98.0)
MONO#: 0.9 10*3/uL (ref 0.1–0.9)
MONO%: 13.9 % (ref 0.0–14.0)
NEUT%: 52.7 % (ref 39.0–75.0)
NEUTROS ABS: 3.3 10*3/uL (ref 1.5–6.5)
PLATELETS: 216 10*3/uL (ref 140–400)
RBC: 4 10*6/uL — ABNORMAL LOW (ref 4.20–5.82)
RDW: 14.1 % (ref 11.0–14.6)
WBC: 6.2 10*3/uL (ref 4.0–10.3)
lymph#: 1.9 10*3/uL (ref 0.9–3.3)

## 2016-10-10 LAB — COMPREHENSIVE METABOLIC PANEL
ALBUMIN: 3.4 g/dL — AB (ref 3.5–5.0)
ALK PHOS: 293 U/L — AB (ref 40–150)
ALT: 10 U/L (ref 0–55)
ANION GAP: 9 meq/L (ref 3–11)
AST: 12 U/L (ref 5–34)
BILIRUBIN TOTAL: 0.31 mg/dL (ref 0.20–1.20)
BUN: 13.2 mg/dL (ref 7.0–26.0)
CO2: 29 mEq/L (ref 22–29)
Calcium: 9.7 mg/dL (ref 8.4–10.4)
Chloride: 103 mEq/L (ref 98–109)
Creatinine: 0.8 mg/dL (ref 0.7–1.3)
GLUCOSE: 124 mg/dL (ref 70–140)
POTASSIUM: 4 meq/L (ref 3.5–5.1)
SODIUM: 141 meq/L (ref 136–145)
TOTAL PROTEIN: 8 g/dL (ref 6.4–8.3)

## 2016-10-10 MED ORDER — CYCLOBENZAPRINE HCL 10 MG PO TABS: 10.0000 mg | ORAL_TABLET | Freq: Two times a day (BID) | ORAL | 0 refills | Status: DC | PRN

## 2016-10-10 MED ORDER — OXYCODONE-ACETAMINOPHEN 5-325 MG PO TABS: 1.0000 | ORAL_TABLET | Freq: Four times a day (QID) | ORAL | 0 refills | Status: DC | PRN

## 2016-10-10 NOTE — Progress Notes (Signed)
Hematology and Oncology Follow Up Visit  Dean Carlson [id number] [birth year] 56 y.o. 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 Dean Carlson was diagnosed in 2014 where Dean Carlson had a Gleason score 4+5 = 9 and PSA of 230. Dean Carlson had disease including and to the periaortic lymph nodes measuring 3 cm.   Prior Therapy: Dean Carlson was treated with Lupron therapy at the time of diagnosis and had an excellent response with PSA down to 0.97 back in August 2015.  His PSA did rise to 18.76 in April 2016 and most recently up to 30.85 on 10/19/2014. At that time Dean Carlson was started on Xtandi and tolerated it poorly. Dean Carlson reported muscle cramps and took it for one week only.  Dean Carlson had been off any treatment other than Lupron since that time. His most recent bone scan was done in May 2016 showed a questionable L2 sclerotic lesion. Zytiga for a total dose of 1000 mg daily started on 02/15/2015. His dose was changed to 500 mg starting on 08/30/2015. Therapy discontinued in January 2018 because of progression of disease. Palliative radiation therapy to the right hip currently ongoing under the care of Dr. Tammi Klippel. Therapy concluded in February 2018 after receiving total of 30 gray to the right hip.  Current therapy:  Xtandi 160 mg daily started in January 2018.  Interim History: Dean Carlson presents today for a follow-up visit. Since his last visit, Dean Carlson reports few complaints. Dean Carlson is reporting right-sided shoulder pain and swelling. Dean Carlson was seen in urgent care and was prescribed Celebrex without any major improvement in his symptoms. Dean Carlson denied any trauma or fevers. Dean Carlson is reporting increased swelling as of late. Dean Carlson does report periodic right-sided hip pain which has improved since completing radiation therapy in February 2018.  Dean Carlson continues to take Xtandi without any major complications. Dean Carlson  does not report any excessive fatigue or tiredness. His appetite remains excellent and his weight is stable. His quality of life remained unchanged at this time. Dean Carlson does report some stomach discomfort and occasional nausea associated with his HIV medication. No recent hospitalizations. Dean Carlson still enjoys a reasonable quality of life and appetite is reasonable.  Dean Carlson does not report any headaches, blurry vision, syncope or seizures. Does not report any fevers, chills, sweats or weight loss. Does not report any chest pain, palpitation orthopnea or leg edema. Does not report any cough, hemoptysis or hematemesis. Dean Carlson does not report any early satiety. Does not report any skeletal complaints of arthralgias or myalgias. Remaining review of systems unremarkable.   Medications: No changes in his medication noted by my review. Current Outpatient Prescriptions  Medication Sig Dispense Refill  . abacavir-dolutegravir-lamiVUDine (TRIUMEQ) 600-50-300 MG tablet Take 1 tablet by mouth daily. 30 tablet 11  . CALCIUM PO Take 1 tablet by mouth daily.    . celecoxib (CELEBREX) 100 MG capsule Take 1 capsule (100 mg total) by mouth 2 (two) times daily. 15 capsule 0  . celecoxib (CELEBREX) 100 MG capsule Take 1 capsule (100 mg total) by mouth 2 (two) times daily. 15 capsule 0  . cyclobenzaprine (FLEXERIL) 10 MG tablet Take 1 tablet (10 mg total) by mouth 2 (two) times daily as needed for muscle spasms. 30 tablet 0  . cyclobenzaprine (FLEXERIL) 10 MG tablet Take 1 tablet (10 mg total) by mouth 2 (two) times daily as needed for muscle spasms. 40 tablet 0  .  leuprolide (LUPRON) 30 MG injection Inject 30 mg into the muscle every 4 (four) months.    Marland Kitchen oxyCODONE-acetaminophen (PERCOCET/ROXICET) 5-325 MG tablet Take 1-2 tablets by mouth every 6 (six) hours as needed for severe pain. 1 to 2 tabs PO q6hrs  PRN for pain 60 tablet 0  . TIVICAY 50 MG tablet TAKE 1 TABLET BY MOUTH EVERY DAY. TAKE WITH TRIUMEQ DAILY 30 tablet 5  . XTANDI 40  MG capsule TAKE 4 CAPSULES BY MOUTH DAILY 120 capsule 0   No current facility-administered medications for this visit.      Allergies:  Allergies  Allergen Reactions  . Alka-Seltzer [Aspirin Effervescent] Other (See Comments)    asthma  . Excedrin Extra Strength [Aspirin-Acetaminophen-Caffeine] Other (See Comments)    REACTION: asthma    Past Medical History, Surgical history, Social history, and Family History were reviewed and updated.   Physical Exam: Blood pressure 126/73, pulse 91, temperature 98.7 F (37.1 C), temperature source Oral, resp. rate 19, height 5\' 11"  (1.803 m), weight 227 lb 8 oz (103.2 kg), SpO2 100 %. ECOG: 1 General appearance: Well-appearing gentleman without distress. Head: Normocephalic, without obvious abnormality no oral thrush or ulcers. Neck: no adenopathy no thyroid masses. Lymph nodes: Cervical, supraclavicular, and axillary nodes normal. Heart:regular rate and rhythm, S1, S2 normal, no murmur, click, rub or gallop Lung:chest clear, no wheezing, rales, normal symmetric air entry Abdomin: soft, non-tender, without masses or organomegaly no rebound or guarding. EXT: Right elbow showed swelling and tenderness to touch. No erythema or drainage noted.   Lab Results: Lab Results  Component Value Date   WBC 6.2 10/10/2016   HGB 12.2 (L) 10/10/2016   HCT 36.9 (L) 10/10/2016   MCV 92.3 10/10/2016   PLT 216 10/10/2016     Chemistry      Component Value Date/Time   NA 141 10/10/2016 1102   K 4.0 10/10/2016 1102   CL 100 10/06/2016 1135   CO2 29 10/10/2016 1102   BUN 13.2 10/10/2016 1102   CREATININE 0.8 10/10/2016 1102      Component Value Date/Time   CALCIUM 9.7 10/10/2016 1102   ALKPHOS 293 (H) 10/10/2016 1102   AST 12 10/10/2016 1102   ALT 10 10/10/2016 1102   BILITOT 0.31 10/10/2016 1102      Results for Dean Carlson, Dean Carlson (MRN [id number]) as of 10/10/2016 11:12  Ref. Range 05/21/2016 14:55 06/27/2016 15:07 08/15/2016 14:02  Prostate  Specific Ag, Serum Latest Ref Range: 0.0 - 4.0 ng/mL 41.3 (H) 30.8 (H) 62.6 (H)      Impression and Plan:   56 year old gentleman with the following issues:  1. 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 His initial diagnosis back in June 2014 where Dean Carlson presented with a Gleason score 4+5 = 9 and a PSA of 230. After a period of androgen deprivation and a PSA nadir of less than 1, Dean Carlson started developing a rapid rise in his PSA and have been intolerant to [place].  CT scan on January 2017 was reviewed which showed pelvic adenopathy and potential 1.9 cm sclerotic lesion at the L2 vertebral body.  Dean Carlson is status post Zytiga therapy that was discontinued because of progression of disease.  Dean Carlson is currently on Xtandi and have tolerated it well so far.   His PSA recently his rising again with slight increase in his pain. The plan is to restage him with a CT scan and a bone scan and determine whether Dean Carlson will require different salvage therapy.  The next step would be systemic chemotherapy although Dean Carlson is not willing to proceed with that at this time.  2. Androgen depravation: This to be continued indefinitely.  3. HIV: His regimen switched by Dr. Tommy Carlson to accommodate [facility] therapy.   4. LFT monitoring: His liver function tests in 10/10/2016 are normal.  5. Right elbow swelling and pain: Unclear etiology. Metastatic prostate cancer is a possibility although other pathology could be included. I will refer him to [facility] for evaluation.  6. Right hip pain: Improved radiation therapy. A have refilled his oxycodone and Flexeril to use as needed.  7. Follow-up: Will be in 4 weeks to follow his progress.   [facility], MD 8/10/201811:46 AM

## 2016-10-10 NOTE — Telephone Encounter (Signed)
Gave patient avs and calendar.   [facility] should follow up with him regarding scans.

## 2016-10-11 LAB — PSA: PROSTATE SPECIFIC AG, SERUM: 81.9 ng/mL — AB (ref 0.0–4.0)

## 2016-10-20 ENCOUNTER — Ambulatory Visit (INDEPENDENT_AMBULATORY_CARE_PROVIDER_SITE_OTHER): Payer: Medicare Other | Admitting: Infectious Disease

## 2016-10-20 ENCOUNTER — Encounter: Payer: Self-pay | Admitting: Infectious Disease

## 2016-10-20 VITALS — BP 144/84 | HR 85 | Temp 98.6°F | Wt 228.0 lb

## 2016-10-20 DIAGNOSIS — C61 Malignant neoplasm of prostate: Secondary | ICD-10-CM

## 2016-10-20 DIAGNOSIS — C7951 Secondary malignant neoplasm of bone: Secondary | ICD-10-CM

## 2016-10-20 DIAGNOSIS — F0630 Mood disorder due to known physiological condition, unspecified: Secondary | ICD-10-CM

## 2016-10-20 DIAGNOSIS — B20 Human immunodeficiency virus [HIV] disease: Secondary | ICD-10-CM

## 2016-10-20 DIAGNOSIS — R42 Dizziness and giddiness: Secondary | ICD-10-CM

## 2016-10-20 DIAGNOSIS — R110 Nausea: Secondary | ICD-10-CM | POA: Diagnosis not present

## 2016-10-20 HISTORY — DX: Nausea: R11.0

## 2016-10-20 NOTE — Progress Notes (Signed)
Chief complaint:  Nausea related to [facility] he thinks also myriad of other complaints related to his treatment for his prostate cancer also pain in his arm  Subjective:    Patient ID: Dean Carlson, male    DOB: 04-Mar-1960, 56 y.o.   MRN: [id number]  HPI  Dean Carlson is 56 year old man with HIV, prostate cancer that was diagnosed while he was on testosterone. He has been followed closely by urology and now by Oncology with Dr. Barbaraann Faster who has  Dean Carlson He is currently on Xtandi but PSA is rising. Dr. Barbaraann Faster is trying to get imaging done to consider a salvage chemotherapy regimen. Dean Carlson himself tells me he will not undergo chemotherapy if this is proposed to him.   He says that he believes that much as was going on with the cancer is related to what he says is a " game to make money."  I'm supportive of him and his decision-making but I would prefer of Dean Carlson with considered treatment for chemotherapy but I do see that we cannot force him to do this. He is going to keep his appointment with Dr. Barbaraann Faster in his continue on his current medications    Past Medical History:  Diagnosis Date  . Adjustment disorder with mixed anxiety and depressed mood 05/09/2015  . Asthma    as a child  . Dizziness 05/09/2015  . Grief 05/09/2015  . HIV (human immunodeficiency virus infection) ([place])   . Insomnia 05/09/2015  . Memory deficit 09/05/2016  . Nausea without vomiting 10/20/2016  . Prostate cancer ([place])   . Weight gain 05/09/2015    Past Surgical History:  Procedure Laterality Date  . COLONOSCOPY  05/13/2011   Procedure: COLONOSCOPY;  Surgeon: Jerene Bears, MD;  Location: [facility];  Service: Gastroenterology;  Laterality: N/A;  . PROSTATE BIOPSY  08/16/2012    Family History  Problem Relation Age of Onset  . Diabetes Unknown   . Cancer Neg Hx       Social History   Social History  . Marital status: Legally Separated    Spouse name: N/A  . Number of children: N/A  . Years of education: N/A    Occupational History  . permanant disability    Social History Main Topics  . Smoking status: Former Smoker    Packs/day: 1.00    Types: Cigarettes    Quit date: 03/15/2011  . Smokeless tobacco: Never Used  . Alcohol use No     Comment: occ  . Drug use: No  . Sexual activity: Not Currently   Other Topics Concern  . None   Social History Narrative  . None    Allergies  Allergen Reactions  . Alka-Seltzer [Aspirin Effervescent] Other (See Comments)    asthma  . Excedrin Extra Strength [Aspirin-Acetaminophen-Caffeine] Other (See Comments)    REACTION: asthma     Current Outpatient Prescriptions:  .  abacavir-dolutegravir-lamiVUDine (TRIUMEQ) 600-50-300 MG tablet, Take 1 tablet by mouth daily., Disp: 30 tablet, Rfl: 11 .  CALCIUM PO, Take 1 tablet by mouth daily., Disp: , Rfl:  .  cyclobenzaprine (FLEXERIL) 10 MG tablet, Take 1 tablet (10 mg total) by mouth 2 (two) times daily as needed for muscle spasms., Disp: 30 tablet, Rfl: 0 .  cyclobenzaprine (FLEXERIL) 10 MG tablet, Take 1 tablet (10 mg total) by mouth 2 (two) times daily as needed for muscle spasms., Disp: 40 tablet, Rfl: 0 .  leuprolide (LUPRON) 30 MG injection, Inject 30 mg into the muscle every  4 (four) months., Disp: , Rfl:  .  oxyCODONE-acetaminophen (PERCOCET/ROXICET) 5-325 MG tablet, Take 1-2 tablets by mouth every 6 (six) hours as needed for severe pain. 1 to 2 tabs PO q6hrs  PRN for pain, Disp: 60 tablet, Rfl: 0 .  TIVICAY 50 MG tablet, TAKE 1 TABLET BY MOUTH EVERY DAY. TAKE WITH TRIUMEQ DAILY, Disp: 30 tablet, Rfl: 5 .  XTANDI 40 MG capsule, TAKE 4 CAPSULES BY MOUTH DAILY, Disp: 120 capsule, Rfl: 0 .  celecoxib (CELEBREX) 100 MG capsule, Take 1 capsule (100 mg total) by mouth 2 (two) times daily. (Patient not taking: Reported on 10/20/2016), Disp: 15 capsule, Rfl: 0 .  celecoxib (CELEBREX) 100 MG capsule, Take 1 capsule (100 mg total) by mouth 2 (two) times daily. (Patient not taking: Reported on 10/20/2016),  Disp: 15 capsule, Rfl: 0    Review of Systems  Constitutional: Negative for activity change, appetite change, chills, diaphoresis, fatigue, fever and unexpected weight change.  HENT: Negative for congestion, rhinorrhea, sinus pressure, sneezing, sore throat and trouble swallowing.   Eyes: Negative for photophobia and visual disturbance.  Respiratory: Negative for cough, chest tightness, shortness of breath, wheezing and stridor.   Cardiovascular: Negative for chest pain, palpitations and leg swelling.  Gastrointestinal: Positive for nausea. Negative for abdominal distention, abdominal pain, anal bleeding, blood in stool, constipation, diarrhea and vomiting.  Genitourinary: Negative for difficulty urinating, dysuria, flank pain and hematuria.  Musculoskeletal: Positive for arthralgias. Negative for back pain, gait problem, joint swelling and myalgias.  Skin: Negative for color change, pallor, rash and wound.  Neurological: Negative for dizziness, tremors, weakness and light-headedness.  Hematological: Negative for adenopathy. Does not bruise/bleed easily.  Psychiatric/Behavioral: Positive for dysphoric mood. Negative for agitation, behavioral problems, confusion, decreased concentration and sleep disturbance.       Objective:   Physical Exam  Constitutional: He is oriented to person, place, and time. He appears well-developed and well-nourished.  HENT:  Head: Normocephalic and atraumatic.  Eyes: Conjunctivae and EOM are normal.  Neck: Normal range of motion. Neck supple.  Cardiovascular: Normal rate and regular rhythm.   Pulmonary/Chest: Effort normal. No respiratory distress. He has no wheezes.  Abdominal: Soft. He exhibits no distension.  Musculoskeletal: Normal range of motion. He exhibits no edema or tenderness.  Neurological: He is alert and oriented to person, place, and time.  Skin: Skin is warm and dry. No rash noted. No erythema. No pallor.  Psychiatric: Judgment and thought  content normal.  Dean Carlson quite of time talking about his believes about his condition also upon questioning him about depressive symptoms does endorse depressive and is but does not want medications and does not want to be seen by counselor because he sees the word alcohol and drugs treatment on the cart of [place]. We spent a great deal of time talking about his spiritual beliefs          Assessment & Plan:   HIV: continue   TRIUMEQ and Tivicay for now. Consider changing to get ABC out of the regimen if it might be contributing to nausea   Prostate cancer: to followup with Dr. Barbaraann Faster. PSA up and he likely needs salvage chemotherapy is to have imaging he tells me he would refuse chemotherapy   Right arm pain: Possibly due to metastasis is going to see orthopedic surgery.  Grief and depression: continue supportive care. He refuses chemotherapy will need some planning to help him deal of potential consequences of the cancer continue to progress  I spent greater  than 40  minutes with the patient including greater than 50% of time in face to face counsel of the patient re his HIV, his prostate cancer his grief, , depression,  Nausea  and in coordination of his care.

## 2016-10-21 ENCOUNTER — Other Ambulatory Visit: Payer: Self-pay | Admitting: Oncology

## 2016-10-21 MED FILL — XTANDI 40 MG CAPSULE: 40 | 30 days supply | Qty: 120 | Fill #0

## 2016-10-21 MED FILL — TRIUMEQ 600-50-300 MG TABS: 600-50-300 | 30 days supply | Qty: 30 | Fill #8

## 2016-11-04 ENCOUNTER — Encounter (HOSPITAL_COMMUNITY): Payer: Medicare Other

## 2016-11-04 ENCOUNTER — Telehealth: Payer: Self-pay | Admitting: Oncology

## 2016-11-04 ENCOUNTER — Other Ambulatory Visit: Payer: Medicare Other

## 2016-11-04 ENCOUNTER — Ambulatory Visit (HOSPITAL_COMMUNITY): Payer: Medicare Other

## 2016-11-04 NOTE — Telephone Encounter (Signed)
Patient called and rescheduled 9/4 lab to 9/14 with scans and rescheduled 9/14 f/u with Dr. Alen Blew to 9/19. Patient has new dates/times.

## 2016-11-05 ENCOUNTER — Ambulatory Visit: Payer: Medicare Other | Admitting: Oncology

## 2016-11-14 ENCOUNTER — Ambulatory Visit: Payer: Medicare Other | Admitting: Oncology

## 2016-11-14 ENCOUNTER — Other Ambulatory Visit: Payer: Medicare Other

## 2016-11-14 ENCOUNTER — Encounter (HOSPITAL_COMMUNITY): Payer: Medicare Other

## 2016-11-14 ENCOUNTER — Ambulatory Visit (HOSPITAL_COMMUNITY): Payer: Medicare Other

## 2016-11-19 ENCOUNTER — Ambulatory Visit: Payer: Medicare Other | Admitting: Oncology

## 2016-11-24 ENCOUNTER — Ambulatory Visit: Payer: Medicare Other | Admitting: Infectious Disease

## 2016-11-25 ENCOUNTER — Other Ambulatory Visit: Payer: Self-pay | Admitting: Oncology

## 2016-11-25 MED FILL — XTANDI 40 MG CAPSULE: 40 | 30 days supply | Qty: 120 | Fill #0

## 2016-11-25 MED FILL — TRIUMEQ 600-50-300 MG TABS: 600-50-300 | 30 days supply | Qty: 30 | Fill #9

## 2016-12-04 ENCOUNTER — Other Ambulatory Visit: Payer: Self-pay | Admitting: *Deleted

## 2016-12-04 DIAGNOSIS — R972 Elevated prostate specific antigen [PSA]: Secondary | ICD-10-CM

## 2016-12-05 ENCOUNTER — Ambulatory Visit (HOSPITAL_COMMUNITY)
Admission: RE | Admit: 2016-12-05 | Discharge: 2016-12-05 | Disposition: A | Discharge status: Home / Self Care | Payer: Medicare Other | Source: Ambulatory Visit | Attending: Oncology | Admitting: Oncology

## 2016-12-05 ENCOUNTER — Encounter (HOSPITAL_COMMUNITY)
Admission: RE | Admit: 2016-12-05 | Discharge: 2016-12-05 | Disposition: A | Discharge status: Home / Self Care | Payer: Medicare Other | Source: Ambulatory Visit | Attending: Oncology | Admitting: Oncology

## 2016-12-05 ENCOUNTER — Encounter (HOSPITAL_COMMUNITY): Payer: Self-pay

## 2016-12-05 ENCOUNTER — Other Ambulatory Visit: Payer: Self-pay | Admitting: *Deleted

## 2016-12-05 ENCOUNTER — Other Ambulatory Visit (HOSPITAL_BASED_OUTPATIENT_CLINIC_OR_DEPARTMENT_OTHER): Payer: Medicare Other

## 2016-12-05 DIAGNOSIS — C61 Malignant neoplasm of prostate: Secondary | ICD-10-CM | POA: Insufficient documentation

## 2016-12-05 DIAGNOSIS — C7951 Secondary malignant neoplasm of bone: Secondary | ICD-10-CM | POA: Diagnosis not present

## 2016-12-05 DIAGNOSIS — R109 Unspecified abdominal pain: Secondary | ICD-10-CM | POA: Diagnosis not present

## 2016-12-05 DIAGNOSIS — R972 Elevated prostate specific antigen [PSA]: Secondary | ICD-10-CM

## 2016-12-05 LAB — CBC WITH DIFFERENTIAL/PLATELET
BASO%: 0.1 % (ref 0.0–2.0)
BASOS ABS: 0 10*3/uL (ref 0.0–0.1)
EOS ABS: 0.1 10*3/uL (ref 0.0–0.5)
EOS%: 1.1 % (ref 0.0–7.0)
HEMATOCRIT: 36.7 % — AB (ref 38.4–49.9)
HGB: 12.1 g/dL — ABNORMAL LOW (ref 13.0–17.1)
LYMPH#: 2 10*3/uL (ref 0.9–3.3)
LYMPH%: 25 % (ref 14.0–49.0)
MCH: 30.3 pg (ref 27.2–33.4)
MCHC: 33 g/dL (ref 32.0–36.0)
MCV: 91.8 fL (ref 79.3–98.0)
MONO#: 0.8 10*3/uL (ref 0.1–0.9)
MONO%: 9.6 % (ref 0.0–14.0)
NEUT#: 5.2 10*3/uL (ref 1.5–6.5)
NEUT%: 64.2 % (ref 39.0–75.0)
Platelets: 258 10*3/uL (ref 140–400)
RBC: 4 10*6/uL — ABNORMAL LOW (ref 4.20–5.82)
RDW: 14.1 % (ref 11.0–14.6)
WBC: 8.1 10*3/uL (ref 4.0–10.3)

## 2016-12-05 LAB — COMPREHENSIVE METABOLIC PANEL
ALT: 10 U/L (ref 0–55)
AST: 12 U/L (ref 5–34)
Albumin: 3.6 g/dL (ref 3.5–5.0)
Alkaline Phosphatase: 379 U/L — ABNORMAL HIGH (ref 40–150)
Anion Gap: 10 mEq/L (ref 3–11)
BILIRUBIN TOTAL: 0.28 mg/dL (ref 0.20–1.20)
BUN: 9.5 mg/dL (ref 7.0–26.0)
CHLORIDE: 102 meq/L (ref 98–109)
CO2: 30 mEq/L — ABNORMAL HIGH (ref 22–29)
CREATININE: 0.8 mg/dL (ref 0.7–1.3)
Calcium: 9.9 mg/dL (ref 8.4–10.4)
EGFR: >90 mL/min/{1.73_m2} (ref >90)
Glucose: 114 mg/dl (ref 70–140)
Potassium: 4 mEq/L (ref 3.5–5.1)
Sodium: 141 mEq/L (ref 136–145)
Total Protein: 8.3 g/dL (ref 6.4–8.3)

## 2016-12-05 MED ORDER — OXYCODONE-ACETAMINOPHEN 5-325 MG PO TABS: 1.0000 | ORAL_TABLET | Freq: Four times a day (QID) | ORAL | 0 refills | Status: DC | PRN

## 2016-12-05 MED ORDER — TECHNETIUM TC 99M MEDRONATE IV KIT
19.3000 | PACK | Freq: Once | INTRAVENOUS | Status: AC | PRN
  Administered 2016-12-05: 19.3 via INTRAVENOUS

## 2016-12-05 MED ORDER — IOPAMIDOL (ISOVUE-300) INJECTION 61%
INTRAVENOUS | Status: AC
  Administered 2016-12-05: 100 mL via INTRAVENOUS
  Filled 2016-12-05: qty 100

## 2016-12-05 MED ORDER — IOPAMIDOL (ISOVUE-300) INJECTION 61%
100.0000 mL | Freq: Once | INTRAVENOUS | Status: AC | PRN
  Administered 2016-12-05: 100 mL via INTRAVENOUS

## 2016-12-12 ENCOUNTER — Telehealth: Payer: Self-pay | Admitting: Oncology

## 2016-12-12 ENCOUNTER — Ambulatory Visit: Payer: Medicare Other | Admitting: Oncology

## 2016-12-12 NOTE — Telephone Encounter (Signed)
Patient called in to cancel 10/12 appt due to inclement weather - said he will call back and r/s

## 2016-12-17 ENCOUNTER — Ambulatory Visit (INDEPENDENT_AMBULATORY_CARE_PROVIDER_SITE_OTHER): Payer: Medicare Other | Admitting: Infectious Disease

## 2016-12-17 ENCOUNTER — Encounter: Payer: Self-pay | Admitting: Infectious Disease

## 2016-12-17 VITALS — BP 120/79 | HR 94 | Temp 99.1°F | Wt 218.0 lb

## 2016-12-17 DIAGNOSIS — C7951 Secondary malignant neoplasm of bone: Secondary | ICD-10-CM

## 2016-12-17 DIAGNOSIS — Z23 Encounter for immunization: Secondary | ICD-10-CM | POA: Diagnosis not present

## 2016-12-17 DIAGNOSIS — R110 Nausea: Secondary | ICD-10-CM | POA: Diagnosis not present

## 2016-12-17 DIAGNOSIS — C61 Malignant neoplasm of prostate: Secondary | ICD-10-CM | POA: Diagnosis not present

## 2016-12-17 DIAGNOSIS — F4323 Adjustment disorder with mixed anxiety and depressed mood: Secondary | ICD-10-CM

## 2016-12-17 DIAGNOSIS — F331 Major depressive disorder, recurrent, moderate: Secondary | ICD-10-CM

## 2016-12-17 DIAGNOSIS — B20 Human immunodeficiency virus [HIV] disease: Secondary | ICD-10-CM | POA: Diagnosis not present

## 2016-12-17 MED ORDER — ONDANSETRON HCL 4 MG PO TABS: 4.0000 mg | ORAL_TABLET | Freq: Three times a day (TID) | ORAL | 5 refills | Status: DC | PRN

## 2016-12-17 MED ORDER — DOLUTEGRAVIR SODIUM 50 MG PO TABS: 50.0000 mg | ORAL_TABLET | Freq: Two times a day (BID) | ORAL | 11 refills | Status: DC

## 2016-12-17 MED ORDER — EMTRICITABINE-TENOFOVIR DF 200-300 MG PO TABS: 1.0000 | ORAL_TABLET | Freq: Every day | ORAL | 11 refills | Status: DC

## 2016-12-17 NOTE — Progress Notes (Signed)
Chief complaint:  Nausea related to Dean Carlson  As well as pain related to his metastatic prostate cancer  Subjective:    Patient ID: Dean Carlson, male    DOB: 06-19-1960, 56 y.o.   MRN: [id number]  HPI  Dean Carlson is 56 year old man with HIV, prostate cancer that was diagnosed while he was on testosterone. He has been followed closely by urology and now by Oncology with Dr. Barbaraann Faster who has   He is currently on Xtandi but PSA is rising. Dr. Barbaraann Faster is considering a salvage chemotherapy regimen.  PSA is rising again and PET scan shows multiple lytic lesions in bone that have progressed.   Dean Carlson is obviously depressed. He says he is not afraid of death, because he knows "where I am going" but he is afraid of continuing to suffer and has trouble with pelvic pain saying it is difficult to open his legs even when he sleeps with a pillow between them.  I had wanted Dean Carlson to meet with him but she could not today. He did meet with Dean Carlson and we tried to construct a regimen that might cause less nausea with Tivicay + Truvada in am and then Tivicay in the pm. Note if he goes off of Xtandi that would open up more options for treating him though I would like him to have chemo therapeutic options on the table.  I believe he very soon will need home hospice given the degree of pain and prognosis appearing poor though I will defer to Dr. Barbaraann Faster re his therapeutic options.   Lab Results  Component Value Date   HIV1RNAQUANT <20 NOT DETECTED 10/06/2016   HIV1RNAQUANT <20 10/05/2015   HIV1RNAQUANT <20 04/09/2015    Lab Results  Component Value Date   CD4TABS 490 10/06/2016   CD4TABS 310 (L) 04/30/2016   CD4TABS 580 10/05/2015       Past Medical History:  Diagnosis Date  . Adjustment disorder with mixed anxiety and depressed mood 05/09/2015  . Asthma    as a child  . Dizziness 05/09/2015  . Grief 05/09/2015  . HIV (human immunodeficiency virus infection) ([place])   . Insomnia 05/09/2015  . Memory deficit  09/05/2016  . Nausea without vomiting 10/20/2016  . Prostate cancer ([place])   . Weight gain 05/09/2015    Past Surgical History:  Procedure Laterality Date  . COLONOSCOPY  05/13/2011   Procedure: COLONOSCOPY;  Surgeon: Jerene Bears, MD;  Location: [facility];  Service: Gastroenterology;  Laterality: N/A;  . PROSTATE BIOPSY  08/16/2012    Family History  Problem Relation Age of Onset  . Diabetes Unknown   . Cancer Neg Hx       Social History   Social History  . Marital status: Legally Separated    Spouse name: N/A  . Number of children: N/A  . Years of education: N/A   Occupational History  . permanant disability    Social History Main Topics  . Smoking status: Former Smoker    Packs/day: 1.00    Types: Cigarettes    Quit date: 03/15/2011  . Smokeless tobacco: Never Used  . Alcohol use No     Comment: occ  . Drug use: No  . Sexual activity: Not Currently   Other Topics Concern  . None   Social History Narrative  . None    Allergies  Allergen Reactions  . Alka-Seltzer [Aspirin Effervescent] Other (See Comments)    asthma  . Excedrin Extra Strength [Aspirin-Acetaminophen-Caffeine] Other (See Comments)  REACTION: asthma     Current Outpatient Prescriptions:  .  cyclobenzaprine (FLEXERIL) 10 MG tablet, Take 1 tablet (10 mg total) by mouth 2 (two) times daily as needed for muscle spasms., Disp: 30 tablet, Rfl: 0 .  cyclobenzaprine (FLEXERIL) 10 MG tablet, Take 1 tablet (10 mg total) by mouth 2 (two) times daily as needed for muscle spasms., Disp: 40 tablet, Rfl: 0 .  dolutegravir (TIVICAY) 50 MG tablet, Take 1 tablet (50 mg total) by mouth 2 (two) times daily., Disp: 60 tablet, Rfl: 11 .  leuprolide (LUPRON) 30 MG injection, Inject 30 mg into the muscle every 4 (four) months., Disp: , Rfl:  .  oxyCODONE-acetaminophen (PERCOCET/ROXICET) 5-325 MG tablet, Take 1-2 tablets by mouth every 6 (six) hours as needed for severe pain., Disp: 60 tablet, Rfl: 0 .  XTANDI 40 MG  capsule, TAKE 4 CAPSULES BY MOUTH DAILY, Disp: 120 capsule, Rfl: 0 .  CALCIUM PO, Take 1 tablet by mouth daily., Disp: , Rfl:  .  celecoxib (CELEBREX) 100 MG capsule, Take 1 capsule (100 mg total) by mouth 2 (two) times daily. (Patient not taking: Reported on 10/20/2016), Disp: 15 capsule, Rfl: 0 .  celecoxib (CELEBREX) 100 MG capsule, Take 1 capsule (100 mg total) by mouth 2 (two) times daily. (Patient not taking: Reported on 10/20/2016), Disp: 15 capsule, Rfl: 0 .  emtricitabine-tenofovir (TRUVADA) 200-300 MG tablet, Take 1 tablet by mouth daily., Disp: 30 tablet, Rfl: 11 .  ondansetron (ZOFRAN) 4 MG tablet, Take 1 tablet (4 mg total) by mouth every 8 (eight) hours as needed for nausea or vomiting., Disp: 90 tablet, Rfl: 5    Review of Systems  Constitutional: Negative for activity change, appetite change, chills, diaphoresis, fatigue, fever and unexpected weight change.  HENT: Negative for congestion, rhinorrhea, sinus pressure, sneezing, sore throat and trouble swallowing.   Eyes: Negative for photophobia and visual disturbance.  Respiratory: Negative for cough, chest tightness, shortness of breath, wheezing and stridor.   Cardiovascular: Negative for chest pain, palpitations and leg swelling.  Gastrointestinal: Positive for nausea. Negative for abdominal distention, abdominal pain, anal bleeding, blood in stool, constipation, diarrhea and vomiting.  Genitourinary: Negative for difficulty urinating, dysuria, flank pain and hematuria.  Musculoskeletal: Positive for arthralgias, back pain, myalgias and neck stiffness. Negative for gait problem and joint swelling.  Skin: Negative for color change, pallor, rash and wound.  Neurological: Negative for dizziness, tremors, weakness and light-headedness.  Hematological: Negative for adenopathy. Does not bruise/bleed easily.  Psychiatric/Behavioral: Positive for decreased concentration and dysphoric mood. Negative for agitation, behavioral problems,  confusion, self-injury and sleep disturbance. The patient is nervous/anxious.        Objective:   Physical Exam  Constitutional: He is oriented to person, place, and time. He appears well-developed and well-nourished.  HENT:  Head: Normocephalic and atraumatic.  Eyes: Conjunctivae and EOM are normal.  Neck: Normal range of motion. Neck supple.  Cardiovascular: Normal rate and regular rhythm.   Pulmonary/Chest: Effort normal. No respiratory distress. He has no wheezes.  Abdominal: Soft. He exhibits no distension.  Musculoskeletal: Normal range of motion. He exhibits no edema or tenderness.  Neurological: He is alert and oriented to person, place, and time.  Skin: Skin is warm and dry. No rash noted. No erythema. No pallor.  Psychiatric: Judgment and thought content normal. He is agitated. Cognition and memory are normal. He exhibits a depressed mood.          Assessment & Plan:   HIV: get ABC out  of the regimen and switch him to Tivicay w Truvada in am and Tivicay in the pm and check labs again in one month  Prostate cancer: to followup with Dr. Barbaraann Faster. It seems he could benefit from more aggressive pain management and supportive psychotherapy. WOuld he be candidate for hospice and are there options to treat him? Dean Carlson seems averse to thew word "chemotherapy"   Grief and depression: continue supportive care. See above  I spent greater than 40 minutes with the patient including greater than 50% of time in face to face counsel of Dean Carlson re his prostate cancer, his multiple joint pains, his anxiety about pain, his depression, his nausea, his prior ARV and new ARV regimen  and in coordination of his care.

## 2016-12-17 NOTE — Patient Instructions (Signed)
Dean Carlson your new regimen will be;  TIVICAY and TRUVADA int he morning (in place of [place]) and then TIVICAY again at night  I want to bring you back in one month to recheck labs on this regimen  I have also sent in zofran that you can take for nausea  If you find that certain meds cause you nausea try taking the zofran one hour before you take those meds

## 2016-12-18 ENCOUNTER — Other Ambulatory Visit: Payer: Self-pay | Admitting: Pharmacist

## 2016-12-18 ENCOUNTER — Other Ambulatory Visit: Payer: Self-pay | Admitting: Oncology

## 2016-12-18 MED FILL — TIVICAY 50 MG TABLET: 50 | 30 days supply | Qty: 60 | Fill #0

## 2016-12-18 MED FILL — TRUVADA 200-300 MG TABS: 200-300 | 30 days supply | Qty: 30 | Fill #0

## 2016-12-24 MED FILL — XTANDI 40 MG CAPSULE: 40 | 30 days supply | Qty: 120 | Fill #0

## 2016-12-25 ENCOUNTER — Telehealth: Payer: Self-pay | Admitting: Oncology

## 2016-12-25 ENCOUNTER — Telehealth: Payer: Self-pay | Admitting: Pharmacist

## 2016-12-25 NOTE — Telephone Encounter (Signed)
Oral Chemotherapy Pharmacist Encounter  Follow-Up Form  Spoke with patient today to follow up regarding patient's oral chemotherapy medication: Xtandi (enzalutamide) for the treatment of metastatic, castration-resistant prostate cancer in conjunction with Lupron, planned duration until disease progression or unacceptable toxicity.  Original Start date of oral chemotherapy: 04/08/16  Pt is doing well today  Pt reports 1 dose of Xtandi 40mg  capsules, 4 capsules (160mg ) by mouth once daily missed in the last month. Patient takes his Xtandi with breakfast every day  Missed dose(s) attributed to: extenuating circumstances today made him being too busy this morning to eat and now day has gotten away from him.  Patient reports nausea if he does not take his Xtandi with food and would like to skip today's dose and restart with breakfast tomorrow 12/26/16  Pertinent labs reviewed: OK for treatment.  Patient knows to call the office with questions or concerns. [facility] will continue to follow.  Thank you,  Johny Drilling, PharmD, BCPS, BCOP 12/25/2016 12:18 PM [facility] [phone]

## 2016-12-25 NOTE — Telephone Encounter (Signed)
Patient called in to reschedule his appointment that was canceled.

## 2016-12-31 ENCOUNTER — Telehealth: Payer: Self-pay | Admitting: *Deleted

## 2016-12-31 NOTE — Telephone Encounter (Signed)
Patient called, wondering what appointment he had scheduled here on 11/5. He states he already has another appointment that day and does not have enough energy for 2 appointments in the same day. He is tearful throughout the conversation, does not feel like he is ready yet for hospice or their counseling services. He does not feel he will get out of his home for counseling on a regular basis at the cancer center. After conversation, patient is interested in long term counseling at [facility], stating "I've got more than just cancer - I also have HIV." He accepted an appointment 11/8 2:00 with Marcie Bal to establish for long term counseling at our office. Landis Gandy, RN

## 2016-12-31 NOTE — Telephone Encounter (Signed)
Dean Carlson is going to need a lot of help right now. I am really very upset to see him in current state with painful metastases

## 2017-01-05 ENCOUNTER — Ambulatory Visit (HOSPITAL_BASED_OUTPATIENT_CLINIC_OR_DEPARTMENT_OTHER): Payer: Medicare Other | Admitting: Oncology

## 2017-01-05 ENCOUNTER — Telehealth: Payer: Self-pay | Admitting: Oncology

## 2017-01-05 ENCOUNTER — Institutional Professional Consult (permissible substitution): Payer: Medicare Other | Admitting: Licensed Clinical Social Worker

## 2017-01-05 DIAGNOSIS — B20 Human immunodeficiency virus [HIV] disease: Secondary | ICD-10-CM

## 2017-01-05 DIAGNOSIS — C61 Malignant neoplasm of prostate: Secondary | ICD-10-CM | POA: Diagnosis not present

## 2017-01-05 DIAGNOSIS — C775 Secondary and unspecified malignant neoplasm of intrapelvic lymph nodes: Secondary | ICD-10-CM | POA: Diagnosis not present

## 2017-01-05 MED ORDER — CYCLOBENZAPRINE HCL 10 MG PO TABS: 10.0000 mg | ORAL_TABLET | Freq: Two times a day (BID) | ORAL | 0 refills | Status: DC | PRN

## 2017-01-05 MED ORDER — OXYCODONE-ACETAMINOPHEN 5-325 MG PO TABS: 1.0000 | ORAL_TABLET | Freq: Four times a day (QID) | ORAL | 0 refills | Status: DC | PRN

## 2017-01-05 NOTE — Telephone Encounter (Signed)
Gave avs and calendar for December  °

## 2017-01-05 NOTE — Progress Notes (Signed)
Hematology and Oncology Follow Up Visit  Dean Carlson [id number] 01-27-1961 56 y.o. 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 He was diagnosed in 2014 where he had a Gleason score 4+5 = 9 and PSA of 230. He had disease including and to the periaortic lymph nodes measuring 3 cm.   Prior Therapy: He was treated with Lupron therapy at the time of diagnosis and had an excellent response with PSA down to 0.97 back in August 2015.  His PSA did rise to 18.76 in April 2016 and most recently up to 30.85 on 10/19/2014. At that time he was started on Xtandi and tolerated it poorly. He reported muscle cramps and took it for one week only.  He had been off any treatment other than Lupron since that time. His most recent bone scan was done in May 2016 showed a questionable L2 sclerotic lesion. Zytiga for a total dose of 1000 mg daily started on 02/15/2015. His dose was changed to 500 mg starting on 08/30/2015. Therapy discontinued in January 2018 because of progression of disease. Palliative radiation therapy to the right hip currently ongoing under the care of Dr. Tammi Klippel. Therapy concluded in February 2018 after receiving total of 30 gray to the right hip.  Current therapy:  Xtandi 160 mg daily started in January 2018.  Interim History: [name] presents today for a follow-up visit. Since his last visit, he continues to have a rather slow decline.  He is experiencing right-sided hip pain despite receiving radiation therapy to the hip area.  He does take Percocet as well as Flexeril which have helped his symptoms.  He is ambulating short distances with the help of a cane and he is getting more help from his sister at this time.  He reports his appetite is been fluctuating and of lost about 3 pounds in the last month.   He continues to take  Xtandi without any major complications. He does not report any excessive fatigue or tiredness. His quality of life showed gradual decline.  He does not report any falls or syncope.  He does not report any GI symptoms or abdominal discomfort.  He does not report any other bone pain or pathological fractures.  He does not report any headaches, blurry vision, syncope or seizures. Does not report any fevers, chills, sweats or weight loss. Does not report any chest pain, palpitation orthopnea or leg edema. Does not report any cough, hemoptysis or hematemesis. He does not report any early satiety. Does not report any skeletal complaints of arthralgias or myalgias. Remaining review of systems unremarkable.   Medications: No changes in his medication noted by my review. Current Outpatient Medications  Medication Sig Dispense Refill  . CALCIUM PO Take 1 tablet by mouth daily.    . celecoxib (CELEBREX) 100 MG capsule Take 1 capsule (100 mg total) by mouth 2 (two) times daily. (Patient not taking: Reported on 10/20/2016) 15 capsule 0  . celecoxib (CELEBREX) 100 MG capsule Take 1 capsule (100 mg total) by mouth 2 (two) times daily. (Patient not taking: Reported on 10/20/2016) 15 capsule 0  . cyclobenzaprine (FLEXERIL) 10 MG tablet Take 1 tablet (10 mg total) by mouth 2 (two) times daily as needed for muscle spasms. 40 tablet 0  . cyclobenzaprine (FLEXERIL) 10 MG tablet Take 1 tablet (10 mg total) 2 (two) times daily as needed by mouth  for muscle spasms. 30 tablet 0  . dolutegravir (TIVICAY) 50 MG tablet Take 1 tablet (50 mg total) by mouth 2 (two) times daily. 60 tablet 11  . emtricitabine-tenofovir (TRUVADA) 200-300 MG tablet Take 1 tablet by mouth daily. 30 tablet 11  . leuprolide (LUPRON) 30 MG injection Inject 30 mg into the muscle every 4 (four) months.    . ondansetron (ZOFRAN) 4 MG tablet Take 1 tablet (4 mg total) by mouth every 8 (eight) hours as needed for nausea or vomiting. 90 tablet 5  .  oxyCODONE-acetaminophen (PERCOCET/ROXICET) 5-325 MG tablet Take 1-2 tablets every 6 (six) hours as needed by mouth for severe pain. 60 tablet 0  . XTANDI 40 MG capsule TAKE 4 CAPSULES BY MOUTH DAILY 120 capsule 0   No current facility-administered medications for this visit.      Allergies:  Allergies  Allergen Reactions  . Alka-Seltzer [Aspirin Effervescent] Other (See Comments)    asthma  . Excedrin Extra Strength [Aspirin-Acetaminophen-Caffeine] Other (See Comments)    REACTION: asthma    Past Medical History, Surgical history, Social history, and Family History were reviewed and updated.   Physical Exam: Blood pressure (!) 157/87, pulse 80, temperature 98.4 F (36.9 C), temperature source Oral, resp. rate 20, height 5\' 11"  (1.803 m), weight 214 lb 12.8 oz (97.4 kg), SpO2 100 %. ECOG: 1 General appearance: Alert, awake gentleman appeared without distress. Head: Normocephalic, without obvious abnormality no oral ulcers or lesions. Neck: no adenopathy no thyroid masses. Lymph nodes: Cervical, supraclavicular, and axillary nodes normal. Heart:regular rate and rhythm, S1, S2 normal, no murmur, click, rub or gallop Lung:chest clear, no wheezing, rales, normal symmetric air entry Abdomin: soft, non-tender, without masses or organomegaly no rebound or guarding. EXT: Right elbow showed swelling and tenderness to touch. No shifting dullness or ascites.   Lab Results: Lab Results  Component Value Date   WBC 8.1 12/05/2016   HGB 12.1 (L) 12/05/2016   HCT 36.7 (L) 12/05/2016   MCV 91.8 12/05/2016   PLT 258 12/05/2016     Chemistry      Component Value Date/Time   NA 141 12/05/2016 1025   K 4.0 12/05/2016 1025   CL 100 10/06/2016 1135   CO2 30 (H) 12/05/2016 1025   BUN 9.5 12/05/2016 1025   CREATININE 0.8 12/05/2016 1025      Component Value Date/Time   CALCIUM 9.9 12/05/2016 1025   ALKPHOS 379 (H) 12/05/2016 1025   AST 12 12/05/2016 1025   ALT 10 12/05/2016 1025    BILITOT 0.28 12/05/2016 1025      Results for Dean, Carlson (MRN [id number]) as of 01/05/2017 09:28  Ref. Range 10/10/2016 11:02  Prostate Specific Ag, Serum Latest Ref Range: 0.0 - 4.0 ng/mL 81.9 (H)   EXAM: CT ABDOMEN AND PELVIS WITH CONTRAST  TECHNIQUE: Multidetector CT imaging of the abdomen and pelvis was performed using the standard protocol following bolus administration of intravenous contrast.  CONTRAST:  100 mL of Isovue-300  COMPARISON:  CT the abdomen and pelvis 03/20/2015.  FINDINGS: Lower chest: Mild linear scarring in the left lower lobe is unchanged.  Hepatobiliary: No cystic or solid hepatic lesions. No intra or extrahepatic biliary ductal dilatation. Gallbladder is nearly completely decompressed, but otherwise unremarkable in appearance.  Pancreas: No pancreatic mass. No pancreatic ductal dilatation. No pancreatic or peripancreatic fluid or inflammatory changes.  Spleen: Unremarkable.  Adrenals/Urinary Tract: Bilateral kidneys and bilateral adrenal glands are normal in appearance. No hydroureteronephrosis. Urinary bladder is normal in appearance.  Stomach/Bowel: The appearance of the stomach is normal. There is no pathologic dilatation of small bowel or colon. Normal appendix.  Vascular/Lymphatic: Atherosclerosis in the pelvic vasculature. No evidence of aneurysm or dissection in the abdominal or pelvic vasculature. Borderline and mildly enlarged pelvic lymph nodes are noted measuring up to 1 cm in short axis in the right obturator nodal station (axial image 68 of series 2), increased compared to the prior examination. Previously noted right-sided retroperitoneal lymphadenopathy has regressed slightly compared to the prior study, now with only multiple prominent but nonenlarged lymph nodes in these regions.  Reproductive: Fiducial markers in the prostate gland. Seminal vesicles are unremarkable in appearance.  Other: No significant  volume of ascites.  No pneumoperitoneum.  Musculoskeletal: Increasing sclerosis open in the right inferior pubic ramus extending to the right ischium, highly concerning for osseous metastasis.  IMPRESSION: 1. Increasing sclerosis in the right hemipelvis involving the right inferior pubic ramus and ischium, highly concerning for osseous metastatic disease. 2. Enlarging right operator lymph node, concerning for metastatic lymphadenopathy. However, previously noted enlarged retroperitoneal lymph nodes have decreased in size, suggesting a mixed response to therapy. 3. Atherosclerosis.  EXAM: CT ABDOMEN AND PELVIS WITH CONTRAST  TECHNIQUE: Multidetector CT imaging of the abdomen and pelvis was performed using the standard protocol following bolus administration of intravenous contrast.  CONTRAST:  173mL OMNIPAQUE IOHEXOL 300 MG/ML  SOLN  COMPARISON:  03/20/2011.  FINDINGS: Lower chest:  Unremarkable.  Hepatobiliary: No focal abnormality within the liver parenchyma. There is no evidence for gallstones, gallbladder wall thickening, or pericholecystic fluid. No intrahepatic or extrahepatic biliary dilation.  Pancreas: No focal mass lesion. No dilatation of the main duct. No intraparenchymal cyst. No peripancreatic edema.  Spleen: No splenomegaly. No focal mass lesion.  Adrenals/Urinary Tract: No adrenal nodule or mass. Kidneys are normal in appearance bilaterally. No evidence for hydroureter. Bladder wall appears irregular and slightly thickened despite the degree of underdistention.  Stomach/Bowel: Small hiatal hernia noted. Stomach otherwise unremarkable. Duodenum is normally positioned as is the ligament of Treitz. No small bowel wall thickening. No small bowel dilatation. The terminal ileum is normal. The appendix is normal. No gross colonic mass. No colonic wall thickening. No substantial diverticular change.  Vascular/Lymphatic: No abdominal aortic  aneurysm. No abdominal atherosclerotic calcification. 1.7 cm short axis retrocaval lymph node is seen on image 26 series 2. 9 mm short axis left para-aortic lymph node is visible on image 27. No evidence for pelvic sidewall lymphadenopathy. 9 mm short axis right external iliac lymph node is seen on image 65. 1.8 cm short axis right inguinal lymph node is seen on image 85.  Reproductive: Prostate gland has somewhat ill-defined margins. There is some minimal stranding in the soft tissues of the pelvic floor.  Other: No intraperitoneal free fluid.  Musculoskeletal: 1.9 cm sclerotic lesion is identified in the L2 vertebral body. This lesion was not present on the previous study.  IMPRESSION: 1. Enlarged retrocaval lymph node, suspicious for metastatic disease in this patient with a history of prostate cancer. 2. New 1.9 cm sclerotic lesion in the L2 vertebral body. Bony metastatic involvement is a concern at this level. 3. Circumferential bladder wall thickening with ill-defined margins of the bladder wall. These changes may be treatment related, but infection could produce this CT appearance.  Impression and Plan:   56 year old gentleman with the following issues:  1. 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 His initial diagnosis back in June 2014 where he presented with  a Gleason score 4+5 = 9 and a PSA of 230. After a period of androgen deprivation and a PSA nadir of less than 1, he started developing a rapid rise in his PSA and have been intolerant to [place].  CT scan on January 2017 was reviewed which showed pelvic adenopathy and potential 1.9 cm sclerotic lesion at the L2 vertebral body.  He is status post Zytiga therapy that was discontinued because of progression of disease.  He is currently on Xtandi and have tolerated it well so far.   His most recent PSA in August 2018 has increased to 81.9.  CT scan and bone scan in  October 2018 were personally reviewed today and discussed with the patient and his sister.  The natural course of this disease was also reviewed in detail as well as treatment options.  He understands that he has an incurable malignancy and our best treatment at this time would be palliative.  The goal of any future therapy will be to control his disease for a period of time and offer him palliation for his bone pain.  The only option that I see that is feasible for him to accomplish his goal would be Taxotere chemotherapy.  Risks and benefits of Taxotere chemotherapy was discussed today in detail with him and his sister.  Complications that include nausea, vomiting, myelosuppression, neutropenia, neutropenic sepsis as well as peripheral neuropathy among others were reviewed.  The goals would be to improve his pain and improve his overall survival.  On average, man that received this chemotherapy has improvement in their pain and increase in their overall survival of at least 3 months.  The alternative to systemic chemotherapy would be supportive care only and possible hospice enrollment.  Both those options were outlined today and discussed in detail.  He will consider this option and let me know in the immediate future.     2. Androgen depravation: This to be continued indefinitely.  3. HIV: Currently under the care of Dr. Tommy Carlson.  This regimen might need to be changed if he is to proceed with Taxotere chemotherapy.  4. LFT monitoring: His liver function tests in 10/10/2016 are normal.  5. Right hip pain: After minimal improvement with radiation therapy, his pain has increased.  Given prescription for Percocet and Flexeril which have helped his symptoms.  6.  IV access: Risks and benefits of Port-A-Cath insertion was also reviewed today.  He will need this to be placed prior to chemotherapy if he decides to proceed that route.  7.  Goals of care: This was reviewed today as well as prognosis.   He has an incurable malignancy but his performance status remains adequate and his disease can be palliated at least for a period of time.  His life expectancy would be in the months rather than years.  With treatment I anticipate close to 12-15 months of life expectancy.  This will be less if he does not respond to chemotherapy or elects against it.   8. Follow-up: Will be in 4 weeks.  This will be sooner if he elects to proceed with chemotherapy.   Zola Button, MD 11/5/20189:47 AM

## 2017-01-08 ENCOUNTER — Ambulatory Visit: Payer: Medicare Other

## 2017-01-09 DIAGNOSIS — C61 Malignant neoplasm of prostate: Secondary | ICD-10-CM | POA: Diagnosis not present

## 2017-01-12 ENCOUNTER — Telehealth: Payer: Self-pay | Admitting: *Deleted

## 2017-01-12 NOTE — Telephone Encounter (Signed)
Patient called and left a voice mail message stating,"please tell Dr. Alen Blew I would like to start chemotherapy. Please set it up after December 5th. I need to prepare myself and get some things done before I start. Return number is [phone]."

## 2017-01-13 ENCOUNTER — Other Ambulatory Visit: Payer: Self-pay | Admitting: Oncology

## 2017-01-13 ENCOUNTER — Telehealth: Payer: Self-pay | Admitting: Oncology

## 2017-01-13 ENCOUNTER — Other Ambulatory Visit: Payer: Self-pay | Admitting: *Deleted

## 2017-01-13 DIAGNOSIS — Z7189 Other specified counseling: Secondary | ICD-10-CM | POA: Insufficient documentation

## 2017-01-13 DIAGNOSIS — C7951 Secondary malignant neoplasm of bone: Principal | ICD-10-CM

## 2017-01-13 DIAGNOSIS — C61 Malignant neoplasm of prostate: Secondary | ICD-10-CM

## 2017-01-13 MED ORDER — LIDOCAINE-PRILOCAINE 2.5-2.5 % EX CREA: TOPICAL_CREAM | CUTANEOUS | 1 refills | Status: AC

## 2017-01-13 MED ORDER — PROCHLORPERAZINE MALEATE 10 MG PO TABS: 10.0000 mg | ORAL_TABLET | Freq: Four times a day (QID) | ORAL | 0 refills | Status: DC | PRN

## 2017-01-13 NOTE — Telephone Encounter (Signed)
Attempted to schedule appt per 11/13 sch msg - however, patient does not want to start treatment until after 12/5. I am going to talk to Dr.Shadad's nurse regarding what to do.

## 2017-01-13 NOTE — Telephone Encounter (Signed)
Please let him know orders are in and he will be contacted by schedulers.  Please call in compazine and Emla.

## 2017-01-13 NOTE — Progress Notes (Signed)
START ON PATHWAY REGIMEN - Prostate     A cycle is every 21 days:     Docetaxel      Prednisone   **Always confirm dose/schedule in your pharmacy ordering system**    Patient Characteristics: Adenocarcinoma, Metastatic, Castration Resistant, Symptomatic, Docetaxel Eligible Current radiographic evidence of distant metastasis<= Yes Histology: Adenocarcinoma AJCC T Category: Staged < 8th Ed. Gleason Primary: Staged < 8th Ed. AJCC N Category: Staged < 8th Ed. Gleason Secondary: Staged < 8th Ed. AJCC M Category: Staged < 8th Ed. Gleason Score: Staged < 8th Ed. AJCC 8 Stage Grouping: Staged < 8th Ed. PSA Values (ng/mL): Staged < 8th Ed. Would you be surprised if this patient died  in the next year<= I would be surprised if this patient died in the next year Intent of Therapy: Non-Curative / Palliative Intent, Discussed with Patient

## 2017-01-13 NOTE — Telephone Encounter (Signed)
As noted below by Dr. Alen Blew, I informed patient that his orders are in and he will be contacted by scheduling to arrange date and time of appointment. Prescriptions to be sent to [facility] on [address] (Compazine and Emla). Patient verbalized understanding.

## 2017-01-14 ENCOUNTER — Telehealth: Payer: Self-pay | Admitting: Oncology

## 2017-01-14 NOTE — Telephone Encounter (Signed)
Spoke with patient regarding appts that were added per 11/13 sch msg - moved the start date to 12/7 - OK per Dr. Alen Blew. Sending patient a confirmation letter as well.

## 2017-01-15 ENCOUNTER — Ambulatory Visit (INDEPENDENT_AMBULATORY_CARE_PROVIDER_SITE_OTHER): Payer: Medicare Other | Admitting: Infectious Diseases

## 2017-01-15 ENCOUNTER — Other Ambulatory Visit: Payer: Self-pay | Admitting: Oncology

## 2017-01-15 ENCOUNTER — Other Ambulatory Visit: Payer: Self-pay | Admitting: *Deleted

## 2017-01-15 ENCOUNTER — Ambulatory Visit: Payer: Medicare Other

## 2017-01-15 DIAGNOSIS — R52 Pain, unspecified: Secondary | ICD-10-CM | POA: Diagnosis not present

## 2017-01-15 DIAGNOSIS — M62838 Other muscle spasm: Secondary | ICD-10-CM | POA: Diagnosis not present

## 2017-01-15 MED ORDER — KETOROLAC TROMETHAMINE 30 MG/ML IJ SOLN
60.0000 mg | Freq: Once | INTRAMUSCULAR | Status: AC
  Administered 2017-01-15: 60 mg via INTRAMUSCULAR

## 2017-01-15 MED FILL — TRUVADA 200-300 MG TABS: 200-300 | 30 days supply | Qty: 30 | Fill #1

## 2017-01-15 MED FILL — TIVICAY 50 MG TABLET: 50 | 30 days supply | Qty: 60 | Fill #1

## 2017-01-15 NOTE — Progress Notes (Signed)
Patient in with Marcie Bal for counseling. Has a history of metastatic cancer d/t prostate primary. In 10/10 pain today and back pain. Marcie Bal asked me to evaluate.   Kidney function reviewed. Allergies reviewed and medications reviewed. He forgot to bring his pain medications with him today.    Will give Toradol IM 60 mg x 1. Advised he can continue to take other scheduled pain medications at home as they are safe to combine. Would ask him to hold celebrex dose however since the toradol will replace this.    Did well with injection. Unable to stay after inj as he needed to make scat transportation. Precautions given to call back if any concern over side effects or reaction    Janene Madeira, MSN, NP-C [facility] for Infectious [place] Pager: [phone]  01/15/17 5:01 PM

## 2017-01-16 ENCOUNTER — Ambulatory Visit: Payer: Medicare Other

## 2017-01-16 ENCOUNTER — Other Ambulatory Visit: Payer: Medicare Other

## 2017-01-18 ENCOUNTER — Other Ambulatory Visit: Payer: Self-pay | Admitting: Oncology

## 2017-01-19 ENCOUNTER — Encounter: Payer: Self-pay | Admitting: Oncology

## 2017-01-19 ENCOUNTER — Encounter: Payer: Self-pay | Admitting: Infectious Disease

## 2017-01-19 ENCOUNTER — Telehealth: Payer: Self-pay | Admitting: *Deleted

## 2017-01-19 ENCOUNTER — Ambulatory Visit (INDEPENDENT_AMBULATORY_CARE_PROVIDER_SITE_OTHER): Payer: Medicare Other | Admitting: Infectious Disease

## 2017-01-19 VITALS — BP 121/88 | HR 99 | Temp 98.3°F | Wt 208.0 lb

## 2017-01-19 DIAGNOSIS — B20 Human immunodeficiency virus [HIV] disease: Secondary | ICD-10-CM | POA: Diagnosis not present

## 2017-01-19 DIAGNOSIS — C61 Malignant neoplasm of prostate: Secondary | ICD-10-CM | POA: Diagnosis not present

## 2017-01-19 DIAGNOSIS — C7951 Secondary malignant neoplasm of bone: Secondary | ICD-10-CM

## 2017-01-19 LAB — BASIC METABOLIC PANEL WITH GFR
BUN: 8 mg/dL (ref 7–25)
CALCIUM: 9.1 mg/dL (ref 8.6–10.3)
CHLORIDE: 100 mmol/L (ref 98–110)
CO2: 31 mmol/L (ref 20–32)
Creat: 0.78 mg/dL (ref 0.70–1.33)
GFR, Est African American: 115 mL/min/{1.73_m2} (ref >=60)
GFR, Est Non African American: 99 mL/min/{1.73_m2} (ref >=60)
Glucose, Bld: 103 mg/dL — ABNORMAL HIGH (ref 65–99)
POTASSIUM: 3.9 mmol/L (ref 3.5–5.3)
Sodium: 139 mmol/L (ref 135–146)

## 2017-01-19 NOTE — Telephone Encounter (Signed)
Patient states pharmacy needs prior auth for emla cream. This RN called [facility] on [address] twice, was on hold for 10 minutes total.  No answer. Instructed patient I will have emla cream here at the desk for him from the [facility].

## 2017-01-19 NOTE — Progress Notes (Signed)
PA approved for Lidocaine-Prilocaine cream via Cover My Meds.    Dean Carlson - PA Case ID: [id number] Need help? Call us at [phone]  Outcome  Approvedtoday  Your request has been approved  DrugLidocaine-Prilocaine 2.5-2.5% EX CREA  [employer] Medicare Electronic [place]  Called [facility](Peter) to advise of approval. He states it still wasn't going through and placed me on hold for the pharmacist. After holding several minutes, I hung up and faxed the approval sent via [facility] [employer] to [employer].

## 2017-01-19 NOTE — Progress Notes (Signed)
Chief complaint:  Pain related to mets  Subjective:    Patient ID: Dean Carlson, male    DOB: 02/12/1961, 56 y.o.   MRN: [id number]  HPI  Dean Carlson is 56 year old man with HIV, prostate cancer that was diagnosed while he was on testosterone. He has been followed closely by urology and now by Oncology with Dr. Barbaraann Faster   PSA is rising again and PET scan shows multiple lytic lesions in bone that have progressed.   He has decided to [place] and inttiate therapy with Taxotere with Dr. Barbaraann Faster.  In the interim his nausea has improved since we got him off of ABC and changed him to Tivicay with Truvada in the am and Tivicay in the pm   Lab Results  Component Value Date   HIV1RNAQUANT <20 NOT DETECTED 10/06/2016   HIV1RNAQUANT <20 10/05/2015   HIV1RNAQUANT <20 04/09/2015    Lab Results  Component Value Date   CD4TABS 490 10/06/2016   CD4TABS 310 (L) 04/30/2016   CD4TABS 580 10/05/2015    He had bad muscle and back spasms last week where even an alarm clock going off could precipitate spasms. He had toradol injection in our clinic.   He is coping with gravity of his diagnosis. He did not want to engage in counseling today.   Past Medical History:  Diagnosis Date  . Adjustment disorder with mixed anxiety and depressed mood 05/09/2015  . Asthma    as a child  . Dizziness 05/09/2015  . Grief 05/09/2015  . HIV (human immunodeficiency virus infection) ([place])   . Insomnia 05/09/2015  . Memory deficit 09/05/2016  . Nausea without vomiting 10/20/2016  . Prostate cancer ([place])   . Weight gain 05/09/2015    Past Surgical History:  Procedure Laterality Date  . COLONOSCOPY N/A 05/13/2011   Performed by Jerene Bears, MD at [facility]  . PROSTATE BIOPSY  08/16/2012    Family History  Problem Relation Age of Onset  . Diabetes Unknown   . Cancer Neg Hx       Social History   Socioeconomic History  . Marital status: Legally Separated    Spouse name: None  . Number of children: None  .  Years of education: None  . Highest education level: None  Social Needs  . Financial resource strain: None  . Food insecurity - worry: None  . Food insecurity - inability: None  . Transportation needs - medical: None  . Transportation needs - non-medical: None  Occupational History  . Occupation: permanant disability  Tobacco Use  . Smoking status: Former Smoker    Packs/day: 1.00    Types: Cigarettes    Last attempt to quit: 03/15/2011    Years since quitting: 5.8  . Smokeless tobacco: Never Used  Substance and Sexual Activity  . Alcohol use: No    Alcohol/week: 0.0 oz    Comment: occ  . Drug use: No  . Sexual activity: Not Currently  Other Topics Concern  . None  Social History Narrative  . None    Allergies  Allergen Reactions  . Alka-Seltzer [Aspirin Effervescent] Other (See Comments)    asthma  . Excedrin Extra Strength [Aspirin-Acetaminophen-Caffeine] Other (See Comments)    REACTION: asthma     Current Outpatient Medications:  .  cyclobenzaprine (FLEXERIL) 10 MG tablet, Take 1 tablet (10 mg total) 2 (two) times daily as needed by mouth for muscle spasms., Disp: 30 tablet, Rfl: 0 .  dolutegravir (TIVICAY) 50 MG  tablet, Take 1 tablet (50 mg total) by mouth 2 (two) times daily., Disp: 60 tablet, Rfl: 11 .  emtricitabine-tenofovir (TRUVADA) 200-300 MG tablet, Take 1 tablet by mouth daily., Disp: 30 tablet, Rfl: 11 .  oxyCODONE-acetaminophen (PERCOCET/ROXICET) 5-325 MG tablet, Take 1-2 tablets every 6 (six) hours as needed by mouth for severe pain., Disp: 60 tablet, Rfl: 0 .  CALCIUM PO, Take 1 tablet by mouth daily., Disp: , Rfl:  .  celecoxib (CELEBREX) 100 MG capsule, Take 1 capsule (100 mg total) by mouth 2 (two) times daily. (Patient not taking: Reported on 10/20/2016), Disp: 15 capsule, Rfl: 0 .  leuprolide (LUPRON) 30 MG injection, Inject 30 mg into the muscle every 4 (four) months., Disp: , Rfl:  .  lidocaine-prilocaine (EMLA) cream, Apply a dime size to  port-a-cath 1-2 hours prior to access. Do not rub in. Cover with [employer]. (Patient not taking: Reported on 01/19/2017), Disp: 30 g, Rfl: 1 .  prochlorperazine (COMPAZINE) 10 MG tablet, TAKE 1 TABLET(10 MG) BY MOUTH EVERY 6 HOURS AS NEEDED FOR NAUSEA OR VOMITING (Patient not taking: Reported on 01/19/2017), Disp: 30 tablet, Rfl: 0    Review of Systems  Constitutional: Negative for activity change, appetite change, chills, diaphoresis, fatigue, fever and unexpected weight change.  HENT: Negative for congestion, rhinorrhea, sinus pressure, sneezing, sore throat and trouble swallowing.   Eyes: Negative for photophobia and visual disturbance.  Respiratory: Negative for cough, chest tightness, shortness of breath, wheezing and stridor.   Cardiovascular: Negative for chest pain, palpitations and leg swelling.  Gastrointestinal: Negative for abdominal distention, abdominal pain, anal bleeding, blood in stool, constipation, diarrhea, nausea and vomiting.  Genitourinary: Negative for difficulty urinating, dysuria, flank pain and hematuria.  Musculoskeletal: Positive for arthralgias, back pain, myalgias and neck stiffness. Negative for gait problem and joint swelling.  Skin: Negative for color change, pallor, rash and wound.  Neurological: Negative for dizziness, tremors, weakness and light-headedness.  Hematological: Negative for adenopathy. Does not bruise/bleed easily.  Psychiatric/Behavioral: Positive for dysphoric mood. Negative for agitation, behavioral problems, confusion, self-injury and sleep disturbance. The patient is nervous/anxious.        Objective:   Physical Exam  Constitutional: He is oriented to person, place, and time. He appears well-developed and well-nourished.  HENT:  Head: Normocephalic and atraumatic.  Eyes: Conjunctivae and EOM are normal.  Neck: Normal range of motion. Neck supple.  Cardiovascular: Normal rate and regular rhythm.  Pulmonary/Chest: Effort normal. No  respiratory distress. He has no wheezes.  Abdominal: Soft. He exhibits no distension.  Musculoskeletal: Normal range of motion. He exhibits no edema or tenderness.  Neurological: He is alert and oriented to person, place, and time.  Skin: Skin is warm and dry. No rash noted. No erythema. No pallor.  Psychiatric: Judgment and thought content normal. Cognition and memory are normal. He exhibits a depressed mood.          Assessment & Plan:   HIV: Continue Tivicay w Truvada in am and Tivicay in the pm and check labs today  Prostate cancer: to followup with Dr. Barbaraann Faster. He will go onto Taxotere therapy  Grief and depression: continue supportive care.   Painful bony mets: on opiates to help with this.

## 2017-01-19 NOTE — Progress Notes (Signed)
Received PA request for Lidocaine-Prilocaine cream.  Submitted via Cover My Meds:  Deretha Emory (Key: [id number])   Your information has been submitted to [place] [employer] Part D. [employer] will review the request and will issue a decision, typically within 1-3 days from your submission. You can check the updated outcome later by reopening this request. If [employer] has not responded in 1-3 days or if you have any questions about your ePA request, please contact [place] [employer] Part D at [phone]. If you think there may be a problem with your PA request, use our live chat feature at the bottom right.

## 2017-01-20 ENCOUNTER — Other Ambulatory Visit: Payer: Self-pay | Admitting: Radiology

## 2017-01-20 LAB — T-HELPER CELL (CD4) - (RCID CLINIC ONLY)
CD4 % Helper T Cell: 24 % — ABNORMAL LOW (ref 33–55)
CD4 T Cell Abs: 450 /uL (ref 400–2700)

## 2017-01-21 ENCOUNTER — Ambulatory Visit (HOSPITAL_COMMUNITY)
Admission: RE | Admit: 2017-01-21 | Discharge: 2017-01-21 | Disposition: A | Discharge status: Home / Self Care | Payer: Medicare Other | Source: Ambulatory Visit | Attending: Oncology | Admitting: Oncology

## 2017-01-21 ENCOUNTER — Other Ambulatory Visit: Payer: Self-pay | Admitting: Oncology

## 2017-01-21 ENCOUNTER — Encounter (HOSPITAL_COMMUNITY): Payer: Self-pay

## 2017-01-21 DIAGNOSIS — Z8546 Personal history of malignant neoplasm of prostate: Secondary | ICD-10-CM | POA: Diagnosis not present

## 2017-01-21 DIAGNOSIS — Z87891 Personal history of nicotine dependence: Secondary | ICD-10-CM | POA: Insufficient documentation

## 2017-01-21 DIAGNOSIS — C7951 Secondary malignant neoplasm of bone: Principal | ICD-10-CM

## 2017-01-21 DIAGNOSIS — C61 Malignant neoplasm of prostate: Secondary | ICD-10-CM

## 2017-01-21 DIAGNOSIS — G4700 Insomnia, unspecified: Secondary | ICD-10-CM | POA: Diagnosis not present

## 2017-01-21 DIAGNOSIS — Z5111 Encounter for antineoplastic chemotherapy: Secondary | ICD-10-CM | POA: Diagnosis not present

## 2017-01-21 DIAGNOSIS — Z21 Asymptomatic human immunodeficiency virus [HIV] infection status: Secondary | ICD-10-CM | POA: Insufficient documentation

## 2017-01-21 DIAGNOSIS — F4323 Adjustment disorder with mixed anxiety and depressed mood: Secondary | ICD-10-CM | POA: Insufficient documentation

## 2017-01-21 HISTORY — PX: IR FLUORO GUIDE PORT INSERTION RIGHT: IMG5741

## 2017-01-21 HISTORY — PX: IR US GUIDE VASC ACCESS RIGHT: IMG2390

## 2017-01-21 LAB — CBC WITH DIFFERENTIAL/PLATELET
BASOS ABS: 0 10*3/uL (ref 0.0–0.1)
Basophils Relative: 0 %
EOS PCT: 1 %
Eosinophils Absolute: 0.1 10*3/uL (ref 0.0–0.7)
HCT: 37 % — ABNORMAL LOW (ref 39.0–52.0)
Hemoglobin: 12.5 g/dL — ABNORMAL LOW (ref 13.0–17.0)
LYMPHS ABS: 1.8 10*3/uL (ref 0.7–4.0)
Lymphocytes Relative: 41 %
MCH: 30 pg (ref 26.0–34.0)
MCHC: 33.8 g/dL (ref 30.0–36.0)
MCV: 88.7 fL (ref 78.0–100.0)
Monocytes Absolute: 0.6 10*3/uL (ref 0.1–1.0)
Monocytes Relative: 14 %
NEUTROS ABS: 2 10*3/uL (ref 1.7–7.7)
NEUTROS PCT: 44 %
PLATELETS: 319 10*3/uL (ref 150–400)
RBC: 4.17 MIL/uL — AB (ref 4.22–5.81)
RDW: 13.5 % (ref 11.5–15.5)
WBC: 4.5 10*3/uL (ref 4.0–10.5)

## 2017-01-21 LAB — PROTIME-INR
INR: 1
Prothrombin Time: 13.1 seconds (ref 11.4–15.2)

## 2017-01-21 LAB — HIV-1 RNA QUANT-NO REFLEX-BLD
HIV 1 RNA Quant: <20 copies/mL
HIV-1 RNA Quant, Log: <1.3 Log copies/mL

## 2017-01-21 MED ORDER — CEFAZOLIN SODIUM-DEXTROSE 2-4 GM/100ML-% IV SOLN
2.0000 g | INTRAVENOUS | Status: AC
  Administered 2017-01-21: 2 g via INTRAVENOUS

## 2017-01-21 MED ORDER — FENTANYL CITRATE (PF) 100 MCG/2ML IJ SOLN
INTRAMUSCULAR | Status: AC
  Filled 2017-01-21: qty 2

## 2017-01-21 MED ORDER — SODIUM CHLORIDE 0.9 % IV SOLN
INTRAVENOUS | Status: DC
  Administered 2017-01-21: 13:00:00 via INTRAVENOUS

## 2017-01-21 MED ORDER — MIDAZOLAM HCL 2 MG/2ML IJ SOLN
INTRAMUSCULAR | Status: AC | PRN
  Administered 2017-01-21 (×3): 1 mg via INTRAVENOUS

## 2017-01-21 MED ORDER — MIDAZOLAM HCL 2 MG/2ML IJ SOLN
INTRAMUSCULAR | Status: AC
  Filled 2017-01-21: qty 4

## 2017-01-21 MED ORDER — FENTANYL CITRATE (PF) 100 MCG/2ML IJ SOLN
INTRAMUSCULAR | Status: AC | PRN
  Administered 2017-01-21 (×2): 50 ug via INTRAVENOUS

## 2017-01-21 MED ORDER — HEPARIN SOD (PORK) LOCK FLUSH 100 UNIT/ML IV SOLN
INTRAVENOUS | Status: AC
  Filled 2017-01-21: qty 5

## 2017-01-21 MED ORDER — LIDOCAINE-EPINEPHRINE (PF) 2 %-1:200000 IJ SOLN
INTRAMUSCULAR | Status: AC
  Filled 2017-01-21: qty 20

## 2017-01-21 MED ORDER — LIDOCAINE-EPINEPHRINE (PF) 1 %-1:200000 IJ SOLN
INTRAMUSCULAR | Status: AC | PRN
  Administered 2017-01-21: 20 mL

## 2017-01-21 MED ORDER — CEFAZOLIN SODIUM-DEXTROSE 2-4 GM/100ML-% IV SOLN
INTRAVENOUS | Status: AC
  Administered 2017-01-21: 2 g via INTRAVENOUS
  Filled 2017-01-21: qty 100

## 2017-01-21 NOTE — Discharge Instructions (Signed)
Implanted Port Insertion, Care After °This sheet gives you information about how to care for yourself after your procedure. Your health care provider may also give you more specific instructions. If you have problems or questions, contact your health care provider. °What can I expect after the procedure? °After your procedure, it is common to have: °Discomfort at the port insertion site. °Bruising on the skin over the port. This should improve over 3-4 days. ° °Follow these instructions at home: °Port care °After your port is placed, you will get a manufacturer's information card. The card has information about your port. Keep this card with you at all times. °Take care of the port as told by your health care provider. Ask your health care provider if you or a family member can get training for taking care of the port at home. A home health care nurse may also take care of the port. °Make sure to remember what type of port you have. °Incision care °Follow instructions from your health care provider about how to take care of your port insertion site. Make sure you: °Wash your hands with soap and water before you change your bandage (dressing). If soap and water are not available, use hand sanitizer. °Change your dressing as told by your health care provider. °Leave stitches (sutures), skin glue, or adhesive strips in place. These skin closures may need to stay in place for 2 weeks or longer. If adhesive strip edges start to loosen and curl up, you may trim the loose edges. Do not remove adhesive strips completely unless your health care provider tells you to do that. °Check your port insertion site every day for signs of infection. Check for: °More redness, swelling, or pain. °More fluid or blood. °Warmth. °Pus or a bad smell. °General instructions °Do not take baths, swim, or use a hot tub until your health care provider approves. °Do not lift anything that is heavier than 10 lb (4.5 kg) for a week, or as told by  your health care provider. °Ask your health care provider when it is okay to: °Return to work or school. °Resume usual physical activities or sports. °Do not drive for 24 hours if you were given a medicine to help you relax (sedative). °Take over-the-counter and prescription medicines only as told by your health care provider. °Wear a medical alert bracelet in case of an emergency. This will tell any health care providers that you have a port. °Keep all follow-up visits as told by your health care provider. This is important. °Contact a health care provider if: °You cannot flush your port with saline as directed, or you cannot draw blood from the port. °You have a fever or chills. °You have more redness, swelling, or pain around your port insertion site. °You have more fluid or blood coming from your port insertion site. °Your port insertion site feels warm to the touch. °You have pus or a bad smell coming from the port insertion site. °Get help right away if: °You have chest pain or shortness of breath. °You have bleeding from your port that you cannot control. °Summary °Take care of the port as told by your health care provider. °Change your dressing as told by your health care provider. °Keep all follow-up visits as told by your health care provider. °This information is not intended to replace advice given to you by your health care provider. Make sure you discuss any questions you have with your health care provider. °Document Released: 12/08/2012 Document Revised:   01/09/2016 Document Reviewed: 01/09/2016 °Elsevier Interactive Patient Education © 2017 Elsevier Inc[employer] °Moderate Conscious Sedation, Adult, Care After °These instructions provide you with information about caring for yourself after your procedure. Your health care provider may also give you more specific instructions. Your treatment has been planned according to current medical practices, but problems sometimes occur. Call your health care provider if  you have any problems or questions after your procedure. °What can I expect after the procedure? °After your procedure, it is common: °· To feel sleepy for several hours. °· To feel clumsy and have poor balance for several hours. °· To have poor judgment for several hours. °· To vomit if you eat too soon. ° °Follow these instructions at home: °For at least 24 hours after the procedure: ° °· Do not: °? Participate in activities where you could fall or become injured. °? Drive. °? Use heavy machinery. °? Drink alcohol. °? Take sleeping pills or medicines that cause drowsiness. °? Make important decisions or sign legal documents. °? Take care of children on your own. °· Rest. °Eating and drinking °· Follow the diet recommended by your health care provider. °· If you vomit: °? Drink water, juice, or soup when you can drink without vomiting. °? Make sure you have little or no nausea before eating solid foods. °General instructions °· Have a responsible adult stay with you until you are awake and alert. °· Take over-the-counter and prescription medicines only as told by your health care provider. °· If you smoke, do not smoke without supervision. °· Keep all follow-up visits as told by your health care provider. This is important. °Contact a health care provider if: °· You keep feeling nauseous or you keep vomiting. °· You feel light-headed. °· You develop a rash. °· You have a fever. °Get help right away if: °· You have trouble breathing. °This information is not intended to replace advice given to you by your health care provider. Make sure you discuss any questions you have with your health care provider. °Document Released: 12/08/2012 Document Revised: 07/23/2015 Document Reviewed: 06/09/2015 °Elsevier Interactive Patient Education © 2018 Elsevier Inc. °Implanted Port Home Guide °An implanted port is a type of central line that is placed under the skin. Central lines are used to provide IV access when treatment or  nutrition needs to be given through a person’s veins. Implanted ports are used for long-term IV access. An implanted port may be placed because: °· You need IV medicine that would be irritating to the small veins in your hands or arms. °· You need long-term IV medicines, such as antibiotics. °· You need IV nutrition for a long period. °· You need frequent blood draws for lab tests. °· You need dialysis. ° °Implanted ports are usually placed in the chest area, but they can also be placed in the upper arm, the abdomen, or the leg. An implanted port has two main parts: °· Reservoir. The reservoir is round and will appear as a small, raised area under your skin. The reservoir is the part where a needle is inserted to give medicines or draw blood. °· Catheter. The catheter is a thin, flexible tube that extends from the reservoir. The catheter is placed into a large vein. Medicine that is inserted into the reservoir goes into the catheter and then into the vein. ° °How will I care for my incision site? °Do not get the incision site wet. Bathe or shower as directed by your health care provider. °How is   my port accessed? °Special steps must be taken to access the port: °· Before the port is accessed, a numbing cream can be placed on the skin. This helps numb the skin over the port site. °· Your health care provider uses a sterile technique to access the port. °? Your health care provider must put on a mask and sterile gloves. °? The skin over your port is cleaned carefully with an antiseptic and allowed to dry. °? The port is gently pinched between sterile gloves, and a needle is inserted into the port. °· Only "non-coring" port needles should be used to access the port. Once the port is accessed, a blood return should be checked. This helps ensure that the port is in the vein and is not clogged. °· If your port needs to remain accessed for a constant infusion, a clear (transparent) bandage will be placed over the needle  site. The bandage and needle will need to be changed every week, or as directed by your health care provider. °· Keep the bandage covering the needle clean and dry. Do not get it wet. Follow your health care provider’s instructions on how to take a shower or bath while the port is accessed. °· If your port does not need to stay accessed, no bandage is needed over the port. ° °What is flushing? °Flushing helps keep the port from getting clogged. Follow your health care provider’s instructions on how and when to flush the port. Ports are usually flushed with saline solution or a medicine called heparin. The need for flushing will depend on how the port is used. °· If the port is used for intermittent medicines or blood draws, the port will need to be flushed: °? After medicines have been given. °? After blood has been drawn. °? As part of routine maintenance. °· If a constant infusion is running, the port may not need to be flushed. ° °How long will my port stay implanted? °The port can stay in for as long as your health care provider thinks it is needed. When it is time for the port to come out, surgery will be done to remove it. The procedure is similar to the one performed when the port was put in. °When should I seek immediate medical care? °When you have an implanted port, you should seek immediate medical care if: °· You notice a bad smell coming from the incision site. °· You have swelling, redness, or drainage at the incision site. °· You have more swelling or pain at the port site or the surrounding area. °· You have a fever that is not controlled with medicine. ° °This information is not intended to replace advice given to you by your health care provider. Make sure you discuss any questions you have with your health care provider. °Document Released: 02/17/2005 Document Revised: 07/26/2015 Document Reviewed: 10/25/2012 °Elsevier Interactive Patient Education © 2017 Elsevier Inc. ° °

## 2017-01-21 NOTE — Procedures (Signed)
Pre Procedure Dx: Prostate Cancer Post Procedural Dx: Same  Successful placement of right IJ approach port-a-cath with tip at the superior caval atrial junction. The catheter is ready for immediate use.  Estimated Blood Loss: Minimal  Complications: None immediate.  Jay Deniese Oberry, MD Pager #: [phone]   

## 2017-01-21 NOTE — Consult Note (Signed)
Chief Complaint: Patient was seen in consultation today for Port-A-Cath placement Referring Physician(s): Wyatt Portela  Supervising Physician: Sandi Mariscal  Patient Status: [place]  History of Present Illness: Dean Carlson is a 56 y.o. male with history of HIV and castrate resistant metastatic prostate carcinoma, initially diagnosed in 2014, status post Lupron and radiation therapy.  Recent bone scan has revealed widespread progressive osseous metastatic disease.  He presents today for Port-A-Cath placement for palliative chemotherapy.  Past Medical History:  Diagnosis Date  . Adjustment disorder with mixed anxiety and depressed mood 05/09/2015  . Asthma    as a child  . Dizziness 05/09/2015  . Grief 05/09/2015  . HIV (human immunodeficiency virus infection) ([place])   . Insomnia 05/09/2015  . Memory deficit 09/05/2016  . Nausea without vomiting 10/20/2016  . Prostate cancer ([place])   . Weight gain 05/09/2015    Past Surgical History:  Procedure Laterality Date  . COLONOSCOPY  05/13/2011   Procedure: COLONOSCOPY;  Surgeon: Jerene Bears, MD;  Location: [facility];  Service: Gastroenterology;  Laterality: N/A;  . PROSTATE BIOPSY  08/16/2012    Allergies: Alka-seltzer [aspirin effervescent] and Excedrin extra strength [aspirin-acetaminophen-caffeine]  Medications: Prior to Admission medications   Medication Sig Start Date End Date Taking? Authorizing Provider  dolutegravir (TIVICAY) 50 MG tablet Take 1 tablet (50 mg total) by mouth 2 (two) times daily. 12/17/16  Yes [name], [name], MD  emtricitabine-tenofovir (TRUVADA) 200-300 MG tablet Take 1 tablet by mouth daily. 12/17/16  Yes [name], [name], MD  leuprolide (LUPRON) 30 MG injection Inject 30 mg into the muscle every 4 (four) months.   Yes [provider]  prochlorperazine (COMPAZINE) 10 MG tablet TAKE 1 TABLET(10 MG) BY MOUTH EVERY 6 HOURS AS NEEDED FOR NAUSEA OR VOMITING 01/19/17  Yes Shadad, Mathis Dad, MD    CALCIUM PO Take 1 tablet by mouth daily.    [provider]  celecoxib (CELEBREX) 100 MG capsule Take 1 capsule (100 mg total) by mouth 2 (two) times daily. Patient not taking: Reported on 10/20/2016 08/11/16   Janne Napoleon, NP  cyclobenzaprine (FLEXERIL) 10 MG tablet Take 1 tablet (10 mg total) 2 (two) times daily as needed by mouth for muscle spasms. 01/05/17   Wyatt Portela, MD  lidocaine-prilocaine (EMLA) cream Apply a dime size to port-a-cath 1-2 hours prior to access. Do not rub in. Cover with [employer]. Patient not taking: Reported on 01/19/2017 01/13/17   Wyatt Portela, MD  oxyCODONE-acetaminophen (PERCOCET/ROXICET) 5-325 MG tablet Take 1-2 tablets every 6 (six) hours as needed by mouth for severe pain. 01/05/17   Wyatt Portela, MD     Family History  Problem Relation Age of Onset  . Diabetes Unknown   . Cancer Neg Hx     Social History   Socioeconomic History  . Marital status: Legally Separated    Spouse name: None  . Number of children: None  . Years of education: None  . Highest education level: None  Social Needs  . Financial resource strain: None  . Food insecurity - worry: None  . Food insecurity - inability: None  . Transportation needs - medical: None  . Transportation needs - non-medical: None  Occupational History  . Occupation: permanant disability  Tobacco Use  . Smoking status: Former Smoker    Packs/day: 1.00    Types: Cigarettes    Last attempt to quit: 03/15/2011    Years since quitting: 5.8  . Smokeless  tobacco: Never Used  Substance and Sexual Activity  . Alcohol use: No    Alcohol/week: 0.0 oz    Comment: occ  . Drug use: No  . Sexual activity: Not Currently  Other Topics Concern  . None  Social History Narrative  . None      Review of Systems currently denies fever, headache, chest pain, dyspnea, cough, abdominal/back pain, nausea, vomiting or bleeding.  Does have lower extremity bony pain.  Vital Signs: BP (!) 147/95 (BP  Location: Right Arm)   Pulse 88   Temp 98.8 F (37.1 C) (Oral)   Resp 18   Ht 5\' 11"  (1.803 m)   Wt 208 lb (94.3 kg)   SpO2 100%   BMI 29.01 kg/m   Physical Exam awake, alert.  Chest clear to auscultation bilaterally.  Heart with regular rate and rhythm.  Abdomen soft, positive bowel sounds, nontender.  No lower extremity edema. Imaging: No results found.  Labs:  CBC: Recent Labs    10/06/16 1135 10/10/16 1102 12/05/16 1025 01/21/17 1239  WBC 5.8 6.2 8.1 4.5  HGB 11.7* 12.2* 12.1* 12.5*  HCT 35.8* 36.9* 36.7* 37.0*  PLT 283 216 258 319    COAGS: Recent Labs    01/21/17 1239  INR 1.00    BMP: Recent Labs    04/30/16 1055  10/06/16 1135 10/10/16 1102 12/05/16 1025 01/19/17 1121  NA 140   < > 142 141 141 139  K 4.1   < > 3.8 4.0 4.0 3.9  CL 102  --  100  --   --  100  CO2 27   < > 30 29 30* 31  GLUCOSE 135*   < > 123* 124 114 103*  BUN 11   < > 7 13.2 9.5 8  CALCIUM 9.7   < > 9.3 9.7 9.9 9.1  CREATININE 0.83   < > 0.67* 0.8 0.8 0.78  GFRNONAA >89  --  >89  --   --  99  GFRAA >89  --  >89  --   --  115   < > = values in this interval not displayed.    LIVER FUNCTION TESTS: Recent Labs    08/15/16 1402 10/06/16 1135 10/10/16 1102 12/05/16 1025  BILITOT 0.22 0.2 0.31 0.28  AST 15 12 12 12   ALT 12 10 10 10   ALKPHOS 230* 280* 293* 379*  PROT 7.7 6.7 8.0 8.3  ALBUMIN 3.5 3.8 3.4* 3.6    TUMOR MARKERS: No results for input(s): AFPTM, CEA, CA199, CHROMGRNA in the last 8760 hours.  Assessment and Plan: 56 y.o. male with history of HIV and castrate resistant metastatic prostate carcinoma, initially diagnosed in 2014, status post Lupron and radiation therapy.  Recent bone scan has revealed widespread progressive osseous metastatic disease.  He presents today for Port-A-Cath placement for palliative chemotherapy.Risks and benefits discussed with the patient/sister including, but not limited to bleeding, infection, pneumothorax, or fibrin sheath  development and need for additional procedures.All of the patient's questions were answered, patient is agreeable to proceed.Consent signed and in chart.    Thank you for this interesting consult.  I greatly enjoyed meeting Dean Carlson and look forward to participating in their care.  A copy of this report was sent to the requesting provider on this date.  Electronically Signed: D. Rowe Robert, PA-C 01/21/2017, 1:29 PM   I spent a total of  25 minutes face to face in consultation with patient, greater than 50% of which was  counseling/coordinating care for port a cath placement

## 2017-02-02 ENCOUNTER — Other Ambulatory Visit: Payer: Medicare Other

## 2017-02-06 ENCOUNTER — Other Ambulatory Visit: Payer: Self-pay | Admitting: *Deleted

## 2017-02-06 ENCOUNTER — Ambulatory Visit (HOSPITAL_BASED_OUTPATIENT_CLINIC_OR_DEPARTMENT_OTHER): Payer: Medicare Other

## 2017-02-06 ENCOUNTER — Other Ambulatory Visit (HOSPITAL_BASED_OUTPATIENT_CLINIC_OR_DEPARTMENT_OTHER): Payer: Medicare Other

## 2017-02-06 ENCOUNTER — Ambulatory Visit (HOSPITAL_BASED_OUTPATIENT_CLINIC_OR_DEPARTMENT_OTHER): Payer: Medicare Other | Admitting: Oncology

## 2017-02-06 ENCOUNTER — Telehealth: Payer: Self-pay | Admitting: Oncology

## 2017-02-06 ENCOUNTER — Ambulatory Visit: Payer: Medicare Other

## 2017-02-06 VITALS — BP 148/82 | HR 98 | Temp 98.6°F | Resp 18 | Ht 71.0 in | Wt 209.8 lb

## 2017-02-06 VITALS — BP 135/75 | HR 103 | Temp 98.9°F | Resp 17

## 2017-02-06 DIAGNOSIS — C775 Secondary and unspecified malignant neoplasm of intrapelvic lymph nodes: Secondary | ICD-10-CM | POA: Diagnosis not present

## 2017-02-06 DIAGNOSIS — C61 Malignant neoplasm of prostate: Secondary | ICD-10-CM

## 2017-02-06 DIAGNOSIS — C7951 Secondary malignant neoplasm of bone: Secondary | ICD-10-CM | POA: Diagnosis not present

## 2017-02-06 DIAGNOSIS — B20 Human immunodeficiency virus [HIV] disease: Secondary | ICD-10-CM | POA: Diagnosis not present

## 2017-02-06 DIAGNOSIS — Z5189 Encounter for other specified aftercare: Secondary | ICD-10-CM | POA: Diagnosis not present

## 2017-02-06 DIAGNOSIS — Z95828 Presence of other vascular implants and grafts: Secondary | ICD-10-CM

## 2017-02-06 DIAGNOSIS — Z5111 Encounter for antineoplastic chemotherapy: Secondary | ICD-10-CM

## 2017-02-06 DIAGNOSIS — Z7189 Other specified counseling: Secondary | ICD-10-CM

## 2017-02-06 LAB — CBC WITH DIFFERENTIAL/PLATELET
BASO%: 0 % (ref 0.0–2.0)
Basophils Absolute: 0 10*3/uL (ref 0.0–0.1)
EOS%: 1.3 % (ref 0.0–7.0)
Eosinophils Absolute: 0.1 10*3/uL (ref 0.0–0.5)
HEMATOCRIT: 35.2 % — AB (ref 38.4–49.9)
HGB: 11.2 g/dL — ABNORMAL LOW (ref 13.0–17.1)
LYMPH#: 1.6 10*3/uL (ref 0.9–3.3)
LYMPH%: 26.3 % (ref 14.0–49.0)
MCH: 28.9 pg (ref 27.2–33.4)
MCHC: 31.8 g/dL — AB (ref 32.0–36.0)
MCV: 90.7 fL (ref 79.3–98.0)
MONO#: 0.5 10*3/uL (ref 0.1–0.9)
MONO%: 8.8 % (ref 0.0–14.0)
NEUT#: 3.9 10*3/uL (ref 1.5–6.5)
NEUT%: 63.6 % (ref 39.0–75.0)
Platelets: 259 10*3/uL (ref 140–400)
RBC: 3.88 10*6/uL — AB (ref 4.20–5.82)
RDW: 14 % (ref 11.0–14.6)
WBC: 6.2 10*3/uL (ref 4.0–10.3)

## 2017-02-06 LAB — COMPREHENSIVE METABOLIC PANEL
ALBUMIN: 3.6 g/dL (ref 3.5–5.0)
ALK PHOS: 498 U/L — AB (ref 40–150)
ALT: 8 U/L (ref 0–55)
AST: 13 U/L (ref 5–34)
Anion Gap: 10 mEq/L (ref 3–11)
BUN: 8 mg/dL (ref 7.0–26.0)
CALCIUM: 9 mg/dL (ref 8.4–10.4)
CO2: 28 mEq/L (ref 22–29)
Chloride: 102 mEq/L (ref 98–109)
Creatinine: 0.8 mg/dL (ref 0.7–1.3)
GLUCOSE: 94 mg/dL (ref 70–140)
Potassium: 4 mEq/L (ref 3.5–5.1)
SODIUM: 140 meq/L (ref 136–145)
TOTAL PROTEIN: 7.8 g/dL (ref 6.4–8.3)

## 2017-02-06 MED ORDER — DEXAMETHASONE SODIUM PHOSPHATE 10 MG/ML IJ SOLN
10.0000 mg | Freq: Once | INTRAMUSCULAR | Status: AC
Start: 2017-02-06 — End: 2017-02-06
  Administered 2017-02-06: 10 mg via INTRAVENOUS

## 2017-02-06 MED ORDER — SODIUM CHLORIDE 0.9% FLUSH
10.0000 mL | INTRAVENOUS | Status: DC | PRN
  Administered 2017-02-06: 10 mL via INTRAVENOUS
  Filled 2017-02-06: qty 10

## 2017-02-06 MED ORDER — SODIUM CHLORIDE 0.9 % IV SOLN
Freq: Once | INTRAVENOUS | Status: AC
  Administered 2017-02-06: 15:00:00 via INTRAVENOUS

## 2017-02-06 MED ORDER — SODIUM CHLORIDE 0.9 % IV SOLN
60.0000 mg/m2 | Freq: Once | INTRAVENOUS | Status: AC
  Administered 2017-02-06: 130 mg via INTRAVENOUS
  Filled 2017-02-06: qty 13

## 2017-02-06 MED ORDER — OXYCODONE-ACETAMINOPHEN 5-325 MG PO TABS: 1.0000 | ORAL_TABLET | Freq: Four times a day (QID) | ORAL | 0 refills | Status: DC | PRN

## 2017-02-06 MED ORDER — DEXAMETHASONE SODIUM PHOSPHATE 10 MG/ML IJ SOLN
INTRAMUSCULAR | Status: AC
  Filled 2017-02-06: qty 1

## 2017-02-06 MED ORDER — PROCHLORPERAZINE MALEATE 10 MG PO TABS: 10.0000 mg | ORAL_TABLET | Freq: Four times a day (QID) | ORAL | 0 refills | Status: AC | PRN

## 2017-02-06 MED ORDER — SODIUM CHLORIDE 0.9% FLUSH
10.0000 mL | INTRAVENOUS | Status: DC | PRN
  Administered 2017-02-06: 10 mL
  Filled 2017-02-06: qty 10

## 2017-02-06 MED ORDER — PEGFILGRASTIM 6 MG/0.6ML ~~LOC~~ PSKT
PREFILLED_SYRINGE | SUBCUTANEOUS | Status: AC
  Filled 2017-02-06: qty 0.6

## 2017-02-06 MED ORDER — HEPARIN SOD (PORK) LOCK FLUSH 100 UNIT/ML IV SOLN
500.0000 [IU] | Freq: Once | INTRAVENOUS | Status: AC | PRN
  Administered 2017-02-06: 500 [IU]
  Filled 2017-02-06: qty 5

## 2017-02-06 MED ORDER — PEGFILGRASTIM 6 MG/0.6ML ~~LOC~~ PSKT
6.0000 mg | PREFILLED_SYRINGE | Freq: Once | SUBCUTANEOUS | Status: AC
  Administered 2017-02-06: 6 mg via SUBCUTANEOUS

## 2017-02-06 NOTE — Patient Instructions (Signed)

## 2017-02-06 NOTE — Telephone Encounter (Signed)
Gave ave and calendar for January 2019 °

## 2017-02-06 NOTE — Patient Instructions (Signed)
[facility] Discharge Instructions for Patients Receiving Chemotherapy  Today you received the following chemotherapy agents:  Taxotere (docetaxel)  To help prevent nausea and vomiting after your treatment, we encourage you to take your nausea medication as prescribed.   If you develop nausea and vomiting that is not controlled by your nausea medication, call the clinic.   BELOW ARE SYMPTOMS THAT SHOULD BE REPORTED IMMEDIATELY:  *FEVER GREATER THAN 100.5 F  *CHILLS WITH OR WITHOUT FEVER  NAUSEA AND VOMITING THAT IS NOT CONTROLLED WITH YOUR NAUSEA MEDICATION  *UNUSUAL SHORTNESS OF BREATH  *UNUSUAL BRUISING OR BLEEDING  TENDERNESS IN MOUTH AND THROAT WITH OR WITHOUT PRESENCE OF ULCERS  *URINARY PROBLEMS  *BOWEL PROBLEMS  UNUSUAL RASH Items with * indicate a potential emergency and should be followed up as soon as possible.  Feel free to call the clinic should you have any questions or concerns. The clinic phone number is [phone].  Please show the [facility] at check-in to the Emergency Department and triage nurse.     Docetaxel injection What is this medicine? DOCETAXEL (doe se TAX el) is a chemotherapy drug. It targets fast dividing cells, like cancer cells, and causes these cells to die. This medicine is used to treat many types of cancers like breast cancer, certain stomach cancers, head and neck cancer, lung cancer, and prostate cancer. This medicine may be used for other purposes; ask your health care provider or pharmacist if you have questions. COMMON BRAND NAME(S): Docefrez, Taxotere What should I tell my health care provider before I take this medicine? They need to know if you have any of these conditions: -infection (especially a virus infection such as chickenpox, cold sores, or herpes) -liver disease -low blood counts, like low white cell, platelet, or red cell counts -an unusual or allergic reaction to docetaxel, polysorbate 80,  other chemotherapy agents, other medicines, foods, dyes, or preservatives -pregnant or trying to get pregnant -breast-feeding How should I use this medicine? This drug is given as an infusion into a vein. It is administered in a hospital or clinic by a specially trained health care professional. Talk to your pediatrician regarding the use of this medicine in children. Special care may be needed. Overdosage: If you think you have taken too much of this medicine contact a poison control center or emergency room at once. NOTE: This medicine is only for you. Do not share this medicine with others. What if I miss a dose? It is important not to miss your dose. Call your doctor or health care professional if you are unable to keep an appointment. What may interact with this medicine? -cyclosporine -erythromycin -ketoconazole -medicines to increase blood counts like filgrastim, pegfilgrastim, sargramostim -vaccines Talk to your doctor or health care professional before taking any of these medicines: -acetaminophen -aspirin -ibuprofen -ketoprofen -naproxen This list may not describe all possible interactions. Give your health care provider a list of all the medicines, herbs, non-prescription drugs, or dietary supplements you use. Also tell them if you smoke, drink alcohol, or use illegal drugs. Some items may interact with your medicine. What should I watch for while using this medicine? Your condition will be monitored carefully while you are receiving this medicine. You will need important blood work done while you are taking this medicine. This drug may make you feel generally unwell. This is not uncommon, as chemotherapy can affect healthy cells as well as cancer cells. Report any side effects. Continue your course of treatment even  though you feel ill unless your doctor tells you to stop. In some cases, you may be given additional medicines to help with side effects. Follow all directions for  their use. Call your doctor or health care professional for advice if you get a fever, chills or sore throat, or other symptoms of a cold or flu. Do not treat yourself. This drug decreases your body's ability to fight infections. Try to avoid being around people who are sick. This medicine may increase your risk to bruise or bleed. Call your doctor or health care professional if you notice any unusual bleeding. This medicine may contain alcohol in the product. You may get drowsy or dizzy. Do not drive, use machinery, or do anything that needs mental alertness until you know how this medicine affects you. Do not stand or sit up quickly, especially if you are an older patient. This reduces the risk of dizzy or fainting spells. Avoid alcoholic drinks. Do not become pregnant while taking this medicine. Women should inform their doctor if they wish to become pregnant or think they might be pregnant. There is a potential for serious side effects to an unborn child. Talk to your health care professional or pharmacist for more information. Do not breast-feed an infant while taking this medicine. What side effects may I notice from receiving this medicine? Side effects that you should report to your doctor or health care professional as soon as possible: -allergic reactions like skin rash, itching or hives, swelling of the face, lips, or tongue -low blood counts - This drug may decrease the number of white blood cells, red blood cells and platelets. You may be at increased risk for infections and bleeding. -signs of infection - fever or chills, cough, sore throat, pain or difficulty passing urine -signs of decreased platelets or bleeding - bruising, pinpoint red spots on the skin, black, tarry stools, nosebleeds -signs of decreased red blood cells - unusually weak or tired, fainting spells, lightheadedness -breathing problems -fast or irregular heartbeat -low blood pressure -mouth sores -nausea and  vomiting -pain, swelling, redness or irritation at the injection site -pain, tingling, numbness in the hands or feet -swelling of the ankle, feet, hands -weight gain Side effects that usually do not require medical attention (report to your doctor or health care professional if they continue or are bothersome): -bone pain -complete hair loss including hair on your head, underarms, pubic hair, eyebrows, and eyelashes -diarrhea -excessive tearing -changes in the color of fingernails -loosening of the fingernails -nausea -muscle pain -red flush to skin -sweating -weak or tired This list may not describe all possible side effects. Call your doctor for medical advice about side effects. You may report side effects to FDA at 1-800-FDA-1088. Where should I keep my medicine? This drug is given in a hospital or clinic and will not be stored at home. NOTE: This sheet is a summary. It may not cover all possible information. If you have questions about this medicine, talk to your doctor, pharmacist, or health care provider.  2018 Elsevier/Gold Standard (2015-03-22 12:32:56)

## 2017-02-06 NOTE — Progress Notes (Signed)
Hematology and Oncology Follow Up Visit  Mays Paino [id number] 1961-01-22 56 y.o. 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 He was diagnosed in 2014 where he had a Gleason score 4+5 = 9 and PSA of 230. He had disease including and to the periaortic lymph nodes measuring 3 cm.   Prior Therapy: He was treated with Lupron therapy at the time of diagnosis and had an excellent response with PSA down to 0.97 back in August 2015.  His PSA did rise to 18.76 in April 2016 and most recently up to 30.85 on 10/19/2014. At that time he was started on Xtandi and tolerated it poorly. He reported muscle cramps and took it for one week only.  He had been off any treatment other than Lupron since that time. His most recent bone scan was done in May 2016 showed a questionable L2 sclerotic lesion. Zytiga for a total dose of 1000 mg daily started on 02/15/2015. His dose was changed to 500 mg starting on 08/30/2015. Therapy discontinued in January 2018 because of progression of disease. Palliative radiation therapy to the right hip currently ongoing under the care of Dr. Tammi Klippel. Therapy concluded in February 2018 after receiving total of 30 gray to the right hip. Xtandi 160 mg daily started in January 2018.  Therapy discontinued in November 2018.  Current therapy:  Taxotere chemotherapy 60 mg/m2 every 3 weeks started on 02/06/2017.  Interim History: Mr. Bellanca presents today for a follow-up visit. Since his last visit, he underwent a Port-A-Cath insertion and ready to proceed with chemotherapy.  He continues to experience generalized pain predominantly on the right hip.  He does take Percocet as well as Flexeril which have helped his symptoms.  He is ambulating short distances with the help of a cane although he did have an episode of fall without any  fractures.  His appetite remains reasonable although his quality of life is declining.   He does not report any headaches, blurry vision, syncope or seizures. Does not report any fevers, chills, sweats or weight loss. Does not report any chest pain, palpitation orthopnea or leg edema. Does not report any cough, hemoptysis or hematemesis. He does not report any early satiety. Does not report any skeletal complaints of arthralgias or myalgias. Remaining review of systems unremarkable.   Medications: No changes in his medication noted by my review. Current Outpatient Medications  Medication Sig Dispense Refill  . CALCIUM PO Take 1 tablet by mouth daily.    . celecoxib (CELEBREX) 100 MG capsule Take 1 capsule (100 mg total) by mouth 2 (two) times daily. (Patient not taking: Reported on 10/20/2016) 15 capsule 0  . cyclobenzaprine (FLEXERIL) 10 MG tablet Take 1 tablet (10 mg total) 2 (two) times daily as needed by mouth for muscle spasms. 30 tablet 0  . dolutegravir (TIVICAY) 50 MG tablet Take 1 tablet (50 mg total) by mouth 2 (two) times daily. 60 tablet 11  . emtricitabine-tenofovir (TRUVADA) 200-300 MG tablet Take 1 tablet by mouth daily. 30 tablet 11  . leuprolide (LUPRON) 30 MG injection Inject 30 mg into the muscle every 4 (four) months.    . lidocaine-prilocaine (EMLA) cream Apply a dime size to port-a-cath 1-2 hours prior to access. Do not rub in. Cover with [employer]. (Patient not taking: Reported on 01/19/2017) 30 g 1  . oxyCODONE-acetaminophen (PERCOCET/ROXICET) 5-325 MG tablet Take  1-2 tablets by mouth every 6 (six) hours as needed for severe pain. 60 tablet 0  . prochlorperazine (COMPAZINE) 10 MG tablet Take 1 tablet (10 mg total) by mouth every 6 (six) hours as needed for nausea or vomiting. 30 tablet 0   No current facility-administered medications for this visit.      Allergies:  Allergies  Allergen Reactions  . Alka-Seltzer [Aspirin Effervescent] Other (See Comments)    asthma   . Excedrin Extra Strength [Aspirin-Acetaminophen-Caffeine] Other (See Comments)    REACTION: asthma    Past Medical History, Surgical history, Social history, and Family History were reviewed and updated.   Physical Exam: Blood pressure (!) 148/82, pulse 98, temperature 98.6 F (37 C), temperature source Oral, resp. rate 18, height 5\' 11"  (1.803 m), weight 209 lb 12.8 oz (95.2 kg), SpO2 100 %. ECOG: 1 General appearance: Chronically ill-appearing gentleman appeared without distress today Head: Normocephalic, without obvious abnormality no oral thrush or ulcers. Neck: no adenopathy no thyroid masses. Lymph nodes: Cervical, supraclavicular, and axillary nodes normal. Heart:regular rate and rhythm, S1, S2 normal, no murmur, click, rub or gallop Lung:chest clear, no wheezing, rales, normal symmetric air entry Abdomin: soft, non-tender, without masses or organomegaly no shifting dullness or ascites. EXT: No edema.   Lab Results: Lab Results  Component Value Date   WBC 6.2 02/06/2017   HGB 11.2 (L) 02/06/2017   HCT 35.2 (L) 02/06/2017   MCV 90.7 02/06/2017   PLT 259 02/06/2017     Chemistry      Component Value Date/Time   NA 139 01/19/2017 1121   NA 141 12/05/2016 1025   K 3.9 01/19/2017 1121   K 4.0 12/05/2016 1025   CL 100 01/19/2017 1121   CO2 31 01/19/2017 1121   CO2 30 (H) 12/05/2016 1025   BUN 8 01/19/2017 1121   BUN 9.5 12/05/2016 1025   CREATININE 0.78 01/19/2017 1121   CREATININE 0.8 12/05/2016 1025      Component Value Date/Time   CALCIUM 9.1 01/19/2017 1121   CALCIUM 9.9 12/05/2016 1025   ALKPHOS 379 (H) 12/05/2016 1025   AST 12 12/05/2016 1025   ALT 10 12/05/2016 1025   BILITOT 0.28 12/05/2016 1025      Results for JASYAH, THEURER (MRN [id number]) as of 01/05/2017 09:28  Ref. Range 10/10/2016 11:02  Prostate Specific Ag, Serum Latest Ref Range: 0.0 - 4.0 ng/mL 81.9 (H)     IMPRESSION: 1. Enlarged retrocaval lymph node, suspicious for metastatic  disease in this patient with a history of prostate cancer. 2. New 1.9 cm sclerotic lesion in the L2 vertebral body. Bony metastatic involvement is a concern at this level. 3. Circumferential bladder wall thickening with ill-defined margins of the bladder wall. These changes may be treatment related, but infection could produce this CT appearance.  Impression and Plan:   56 year old gentleman with the following issues:  1. 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 His initial diagnosis back in June 2014 where he presented with a Gleason score 4+5 = 9 and a PSA of 230. After a period of androgen deprivation and a PSA nadir of less than 1, he started developing a rapid rise in his PSA and have been intolerant to [place].  CT scan on January 2017 was reviewed which showed pelvic adenopathy and potential 1.9 cm sclerotic lesion at the L2 vertebral body.  He is status post Zytiga therapy that was discontinued because of progression of disease.  He progressed on [place]  based on imaging studies in October 2018.  He was given the option of supportive care only versus systemic chemotherapy and opted to proceed with palliative chemotherapy.  Risks and benefits of Taxotere chemotherapy would reviewed again.  Complications that include arthralgias, myalgias, neuropathy and neutropenia were reviewed.  The benefit would be improving and his pain and potentially quality of life overall.  He is ready to proceed with cycle 1 today and repeated every 3 weeks.   2. Androgen depravation: This to be continued indefinitely.  3. HIV: Currently under the care of Dr. [name].  No changes in his regimen while he is receiving Taxotere chemotherapy.  4.  Neutropenia prophylaxis: He will receive Neulasta Onpro for convenience.  5. Right hip pain: After minimal improvement with radiation therapy, his pain has increased.  Given prescription for Percocet and Flexeril which  have helped his symptoms.  6.  IV access: Port-A-Cath inserted without complications.  7.  Goals of care: Therapy is palliative at this time.  His performance status is adequate to proceed with aggressive therapy at this time.  We will continue to address that with him moving forward.  8.  Antiemetics: Prescription for Compazine was made available to the patient.  9. Follow-up: Will be in 3 weeks for the next cycle of chemotherapy.   Zola Button, MD 12/7/20181:13 PM

## 2017-02-07 LAB — PSA: Prostate Specific Ag, Serum: 211.8 ng/mL — ABNORMAL HIGH (ref 0.0–4.0)

## 2017-02-09 ENCOUNTER — Other Ambulatory Visit: Payer: Medicare Other

## 2017-02-09 ENCOUNTER — Ambulatory Visit: Payer: Medicare Other | Admitting: Oncology

## 2017-02-09 MED FILL — TRUVADA 200-300 MG TABS: 200-300 | 30 days supply | Qty: 30 | Fill #2

## 2017-02-09 MED FILL — TIVICAY 50 MG TABLET: 50 | 30 days supply | Qty: 60 | Fill #2

## 2017-02-13 ENCOUNTER — Ambulatory Visit: Payer: Medicare Other

## 2017-02-13 ENCOUNTER — Other Ambulatory Visit: Payer: Medicare Other

## 2017-02-13 ENCOUNTER — Ambulatory Visit: Payer: Medicare Other | Admitting: Oncology

## 2017-02-17 ENCOUNTER — Encounter: Payer: Self-pay | Admitting: *Deleted

## 2017-02-17 ENCOUNTER — Encounter: Payer: Self-pay | Admitting: Oncology

## 2017-02-17 ENCOUNTER — Telehealth: Payer: Self-pay | Admitting: *Deleted

## 2017-02-17 DIAGNOSIS — C61 Malignant neoplasm of prostate: Secondary | ICD-10-CM

## 2017-02-17 NOTE — Progress Notes (Signed)
Called patient to introduce myself as Arboriculturist and to advise of two in-house grants that he may apply for and what they cover. Patient states he is interested in applying. Asked if he could bring his proof of income on 02/27/17, based on his verbal he qualifies. He states he can. Will see patient on 02/27/17.

## 2017-02-17 NOTE — Telephone Encounter (Signed)
Patient calling to reqyest a rolling wheelchair with a seat. DME order sent for above, in epic. Patient notified.

## 2017-02-26 ENCOUNTER — Telehealth: Payer: Self-pay | Admitting: *Deleted

## 2017-02-26 ENCOUNTER — Other Ambulatory Visit: Payer: Self-pay

## 2017-02-26 NOTE — Telephone Encounter (Signed)
Called patient and informed him that I faxed the order for a rolling walker to [facility]. He verbalized understanding.

## 2017-02-27 ENCOUNTER — Encounter: Payer: Self-pay | Admitting: Oncology

## 2017-02-27 ENCOUNTER — Ambulatory Visit: Payer: Medicare Other

## 2017-02-27 ENCOUNTER — Telehealth: Payer: Self-pay | Admitting: Oncology

## 2017-02-27 ENCOUNTER — Ambulatory Visit (HOSPITAL_BASED_OUTPATIENT_CLINIC_OR_DEPARTMENT_OTHER): Payer: Medicare Other

## 2017-02-27 ENCOUNTER — Ambulatory Visit (HOSPITAL_BASED_OUTPATIENT_CLINIC_OR_DEPARTMENT_OTHER): Payer: Medicare Other | Admitting: Oncology

## 2017-02-27 ENCOUNTER — Other Ambulatory Visit (HOSPITAL_BASED_OUTPATIENT_CLINIC_OR_DEPARTMENT_OTHER): Payer: Medicare Other

## 2017-02-27 ENCOUNTER — Telehealth: Payer: Self-pay | Admitting: *Deleted

## 2017-02-27 VITALS — BP 129/77 | HR 90 | Temp 98.7°F | Resp 18 | Ht 71.0 in | Wt 205.6 lb

## 2017-02-27 DIAGNOSIS — C7951 Secondary malignant neoplasm of bone: Principal | ICD-10-CM

## 2017-02-27 DIAGNOSIS — C61 Malignant neoplasm of prostate: Secondary | ICD-10-CM

## 2017-02-27 DIAGNOSIS — Z5111 Encounter for antineoplastic chemotherapy: Secondary | ICD-10-CM

## 2017-02-27 DIAGNOSIS — Z7189 Other specified counseling: Secondary | ICD-10-CM

## 2017-02-27 DIAGNOSIS — M8588 Other specified disorders of bone density and structure, other site: Secondary | ICD-10-CM | POA: Diagnosis not present

## 2017-02-27 DIAGNOSIS — C775 Secondary and unspecified malignant neoplasm of intrapelvic lymph nodes: Secondary | ICD-10-CM | POA: Diagnosis not present

## 2017-02-27 LAB — COMPREHENSIVE METABOLIC PANEL
ALK PHOS: 497 U/L — AB (ref 40–150)
ALT: 12 U/L (ref 0–55)
AST: 13 U/L (ref 5–34)
Albumin: 3.5 g/dL (ref 3.5–5.0)
Anion Gap: 7 mEq/L (ref 3–11)
BILIRUBIN TOTAL: 0.28 mg/dL (ref 0.20–1.20)
BUN: 9.1 mg/dL (ref 7.0–26.0)
CO2: 28 mEq/L (ref 22–29)
CREATININE: 0.8 mg/dL (ref 0.7–1.3)
Calcium: 8.8 mg/dL (ref 8.4–10.4)
Chloride: 105 mEq/L (ref 98–109)
EGFR: >60 mL/min/{1.73_m2} (ref >60)
GLUCOSE: 83 mg/dL (ref 70–140)
POTASSIUM: 3.4 meq/L — AB (ref 3.5–5.1)
Sodium: 140 mEq/L (ref 136–145)
Total Protein: 7.3 g/dL (ref 6.4–8.3)

## 2017-02-27 LAB — CBC WITH DIFFERENTIAL/PLATELET
BASO%: 0.3 % (ref 0.0–2.0)
Basophils Absolute: 0 10*3/uL (ref 0.0–0.1)
EOS%: 0.4 % (ref 0.0–7.0)
Eosinophils Absolute: 0 10*3/uL (ref 0.0–0.5)
HCT: 33 % — ABNORMAL LOW (ref 38.4–49.9)
HGB: 10.7 g/dL — ABNORMAL LOW (ref 13.0–17.1)
LYMPH%: 34.6 % (ref 14.0–49.0)
MCH: 29.1 pg (ref 27.2–33.4)
MCHC: 32.5 g/dL (ref 32.0–36.0)
MCV: 89.7 fL (ref 79.3–98.0)
MONO#: 0.7 10*3/uL (ref 0.1–0.9)
MONO%: 14.6 % — AB (ref 0.0–14.0)
NEUT%: 50.1 % (ref 39.0–75.0)
NEUTROS ABS: 2.4 10*3/uL (ref 1.5–6.5)
PLATELETS: 299 10*3/uL (ref 140–400)
RBC: 3.68 10*6/uL — AB (ref 4.20–5.82)
RDW: 16.3 % — ABNORMAL HIGH (ref 11.0–14.6)
WBC: 4.8 10*3/uL (ref 4.0–10.3)
lymph#: 1.7 10*3/uL (ref 0.9–3.3)

## 2017-02-27 MED ORDER — SODIUM CHLORIDE 0.9 % IV SOLN
60.0000 mg/m2 | Freq: Once | INTRAVENOUS | Status: AC
  Administered 2017-02-27: 130 mg via INTRAVENOUS
  Filled 2017-02-27: qty 13

## 2017-02-27 MED ORDER — SODIUM CHLORIDE 0.9% FLUSH
10.0000 mL | INTRAVENOUS | Status: DC | PRN
  Administered 2017-02-27: 10 mL
  Filled 2017-02-27: qty 10

## 2017-02-27 MED ORDER — PEGFILGRASTIM 6 MG/0.6ML ~~LOC~~ PSKT
PREFILLED_SYRINGE | SUBCUTANEOUS | Status: AC
  Filled 2017-02-27: qty 0.6

## 2017-02-27 MED ORDER — HEPARIN SOD (PORK) LOCK FLUSH 100 UNIT/ML IV SOLN
500.0000 [IU] | Freq: Once | INTRAVENOUS | Status: AC | PRN
  Administered 2017-02-27: 500 [IU]
  Filled 2017-02-27: qty 5

## 2017-02-27 MED ORDER — DEXAMETHASONE SODIUM PHOSPHATE 10 MG/ML IJ SOLN
INTRAMUSCULAR | Status: AC
  Filled 2017-02-27: qty 1

## 2017-02-27 MED ORDER — SODIUM CHLORIDE 0.9 % IV SOLN
Freq: Once | INTRAVENOUS | Status: AC
  Administered 2017-02-27: 13:00:00 via INTRAVENOUS

## 2017-02-27 MED ORDER — PEGFILGRASTIM 6 MG/0.6ML ~~LOC~~ PSKT
6.0000 mg | PREFILLED_SYRINGE | Freq: Once | SUBCUTANEOUS | Status: AC
  Administered 2017-02-27: 6 mg via SUBCUTANEOUS

## 2017-02-27 MED ORDER — DEXAMETHASONE SODIUM PHOSPHATE 10 MG/ML IJ SOLN
10.0000 mg | Freq: Once | INTRAMUSCULAR | Status: AC
  Administered 2017-02-27: 10 mg via INTRAVENOUS

## 2017-02-27 NOTE — Telephone Encounter (Signed)
Scheduled appt per 12/28 los - Gave patient AVS and calender per los.

## 2017-02-27 NOTE — Patient Instructions (Signed)
[facility] Discharge Instructions for Patients Receiving Chemotherapy  Today you received the following chemotherapy agents: Taxotere  To help prevent nausea and vomiting after your treatment, we encourage you to take your nausea medication as directed.    If you develop nausea and vomiting that is not controlled by your nausea medication, call the clinic.   BELOW ARE SYMPTOMS THAT SHOULD BE REPORTED IMMEDIATELY:  *FEVER GREATER THAN 100.5 F  *CHILLS WITH OR WITHOUT FEVER  NAUSEA AND VOMITING THAT IS NOT CONTROLLED WITH YOUR NAUSEA MEDICATION  *UNUSUAL SHORTNESS OF BREATH  *UNUSUAL BRUISING OR BLEEDING  TENDERNESS IN MOUTH AND THROAT WITH OR WITHOUT PRESENCE OF ULCERS  *URINARY PROBLEMS  *BOWEL PROBLEMS  UNUSUAL RASH Items with * indicate a potential emergency and should be followed up as soon as possible.  Feel free to call the clinic should you have any questions or concerns. The clinic phone number is [phone].  Please show the CHEMO ALERT CARD at check-in to the Emergency Department and triage nurse.   

## 2017-02-27 NOTE — Progress Notes (Signed)
Hematology and Oncology Follow Up Visit  Dean Carlson [id number] [birth year] 56 y.o. 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 He was diagnosed in 2014 where he had a Gleason score 4+5 = 9 and PSA of 230. He had disease including and to the periaortic lymph nodes measuring 3 cm.   Prior Therapy: He was treated with Lupron therapy at the time of diagnosis and had an excellent response with PSA down to 0.97 back in August 2015.  His PSA did rise to 18.76 in April 2016 and most recently up to 30.85 on 10/19/2014. At that time he was started on Xtandi and tolerated it poorly. He reported muscle cramps and took it for one week only.  He had been off any treatment other than Lupron since that time. His most recent bone scan was done in May 2016 showed a questionable L2 sclerotic lesion. Zytiga for a total dose of 1000 mg daily started on 02/15/2015. His dose was changed to 500 mg starting on 08/30/2015. Therapy discontinued in January 2018 because of progression of disease. Palliative radiation therapy to the right hip currently ongoing under the care of Dr. Tammi Klippel. Therapy concluded in February 2018 after receiving total of 30 gray to the right hip. Xtandi 160 mg daily started in January 2018.  Therapy discontinued in November 2018.  Current therapy:  Taxotere chemotherapy 60 mg/m2 every 3 weeks started on 02/06/2017.  He is here for cycle 2 of therapy.  Interim History: Dean Carlson presents today for a follow-up visit. Since his last visit, he received the first cycle of chemotherapy without complications.  He did report some arthralgias and myalgias associated with Neulasta and subsequently these symptoms have resolved.  He does not report any nausea, vomiting or neuropathy.  He is quality of life has not dramatically changed.  He did  report improvement in his overall bone pain.  His hip pain is still noticeable but has decreased dramatically.  He is able to ambulate much better without any falls or syncope.  His appetite has been affected slightly with alteration of the taste.   He does not report any headaches, blurry vision, syncope or seizures. Does not report any fevers, chills, sweats or weight loss. Does not report any chest pain, palpitation orthopnea or leg edema. Does not report any cough, hemoptysis or hematemesis. He does not report any early satiety. Does not report any skeletal complaints of arthralgias or myalgias. Remaining review of systems unremarkable.   Medications: No changes in his medication noted by my review. Current Outpatient Medications  Medication Sig Dispense Refill  . CALCIUM PO Take 1 tablet by mouth daily.    . celecoxib (CELEBREX) 100 MG capsule Take 1 capsule (100 mg total) by mouth 2 (two) times daily. 15 capsule 0  . cyclobenzaprine (FLEXERIL) 10 MG tablet Take 1 tablet (10 mg total) 2 (two) times daily as needed by mouth for muscle spasms. 30 tablet 0  . dolutegravir (TIVICAY) 50 MG tablet Take 1 tablet (50 mg total) by mouth 2 (two) times daily. 60 tablet 11  . emtricitabine-tenofovir (TRUVADA) 200-300 MG tablet Take 1 tablet by mouth daily. 30 tablet 11  . leuprolide (LUPRON) 30 MG injection Inject 30 mg into the muscle every 4 (four) months.    . lidocaine-prilocaine (EMLA) cream Apply a dime size to port-a-cath 1-2 hours prior to access. Do not  rub in. Cover with [employer]. 30 g 1  . oxyCODONE-acetaminophen (PERCOCET/ROXICET) 5-325 MG tablet Take 1-2 tablets by mouth every 6 (six) hours as needed for severe pain. 60 tablet 0  . prochlorperazine (COMPAZINE) 10 MG tablet Take 1 tablet (10 mg total) by mouth every 6 (six) hours as needed for nausea or vomiting. 30 tablet 0   No current facility-administered medications for this visit.      Allergies:  Allergies  Allergen Reactions   . Alka-Seltzer [Aspirin Effervescent] Other (See Comments)    asthma  . Excedrin Extra Strength [Aspirin-Acetaminophen-Caffeine] Other (See Comments)    REACTION: asthma    Past Medical History, Surgical history, Social history, and Family History were reviewed and updated.   Physical Exam: Blood pressure 129/77, pulse 90, temperature 98.7 F (37.1 C), temperature source Oral, resp. rate 18, height 5\' 11"  (1.803 m), weight 205 lb 9.6 oz (93.3 kg), SpO2 100 %. ECOG: 1 General appearance: Alert, awake gentleman without distress. Head: Normocephalic, without obvious abnormality no oral ulcers or thrush. Neck: no adenopathy no thyroid masses. Lymph nodes: Cervical, supraclavicular, and axillary nodes normal. Heart:regular rate and rhythm, S1, S2 normal, no murmur, click, rub or gallop Lung:chest clear, no wheezing, rales, normal symmetric air entry Abdomin: soft, non-tender, without masses or organomegaly no rebound or guarding. EXT: No edema.   Lab Results: Lab Results  Component Value Date   WBC 4.8 02/27/2017   HGB 10.7 (L) 02/27/2017   HCT 33.0 (L) 02/27/2017   MCV 89.7 02/27/2017   PLT 299 02/27/2017     Chemistry      Component Value Date/Time   NA 140 02/06/2017 1147   K 4.0 02/06/2017 1147   CL 100 01/19/2017 1121   CO2 28 02/06/2017 1147   BUN 8.0 02/06/2017 1147   CREATININE 0.8 02/06/2017 1147      Component Value Date/Time   CALCIUM 9.0 02/06/2017 1147   ALKPHOS 498 (H) 02/06/2017 1147   AST 13 02/06/2017 1147   ALT 8 02/06/2017 1147   BILITOT <0.22 02/06/2017 1147      Results for Dean Carlson (MRN [id number]) as of 02/27/2017 12:33  Ref. Range 10/10/2016 11:02 02/06/2017 11:47  Prostate Specific Ag, Serum Latest Ref Range: 0.0 - 4.0 ng/mL 81.9 (H) 211.8 (H)     Impression and Plan:   56 year old gentleman with the following issues:  1. 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 His initial diagnosis  back in June 2014 where he presented with a Gleason score 4+5 = 9 and a PSA of 230. After a period of androgen deprivation and a PSA nadir of less than 1, he started developing a rapid rise in his PSA and have been intolerant to [place].  CT scan on January 2017 was reviewed which showed pelvic adenopathy and potential 1.9 cm sclerotic lesion at the L2 vertebral body.  He is status post Zytiga therapy that was discontinued because of progression of disease.  He progressed on Xtandi based on imaging studies in October 2018.    He is currently receiving Taxotere chemotherapy without major complications.  He received the first cycle with reasonable clinical benefit and overall improvement of his bone pain.  Risks and benefits of continuing this treatment was discussed today he is agreeable to continue.  Plan is to treat him with 10 cycles and he is ready to proceed with cycle 2.   2. Androgen depravation: This to be continued indefinitely.  3. HIV: Currently under  the care of Dr. Tommy Carlson.  No changes in his regimen while he is receiving Taxotere chemotherapy.  4.  Neutropenia prophylaxis: He will receive Neulasta Onpro for convenience.  5. Right hip pain: Improved after starting systemic chemotherapy.  6.  IV access: Port-A-Cath inserted without complications.  Continue be in use without issues.  7.  Goals of care: Therapy is palliative at this time.  His performance status is adequate to proceed with aggressive therapy at this time.    8.  Antiemetics: Prescription for Compazine was made available to the patient.  9. Follow-up: Will be in 3 weeks for the next cycle of chemotherapy.   Zola Button, MD 12/28/20181:00 PM

## 2017-02-27 NOTE — Telephone Encounter (Signed)
Patient called and stated,"I had chemo today and they put this thing on my arm after treatment. When I got home and took my shirt off, the thing came off. I don't have any funds left this month to come back up there. What do I do?" Told him that it was his Neulasta Onpro that came off. Per Dr. Alen Blew, if he can't come back here Saturday or Monday, don't worry about it this time. I explained this to patient and he verbalized understanding. Instructed him to stay away from crowds, maintain good handwashing, and if he started having temperatures/fevers please call this office. He verbalized understanding.

## 2017-02-27 NOTE — Progress Notes (Signed)
Met with patient whom brought proof of income for in-house grants. Patient states he worked with someone downstairs whom helped him with a grant in the past that helped with medication. Reviewed sharepoint and patient has [employer] already active. Approved patient for the one-time $200 Prostate grant which helps with transportation and medication only. Gave patient a copy of the approval letter.   Patient mentioned needing assistance with rent. Advised patient to make arrangements to get me a copy of his lease agreement via fax. Patient verbalized understanding and has my card with my contact information as well as email.

## 2017-02-28 LAB — PSA: PROSTATE SPECIFIC AG, SERUM: 199.5 ng/mL — AB (ref 0.0–4.0)

## 2017-03-06 ENCOUNTER — Other Ambulatory Visit: Payer: Medicare Other

## 2017-03-06 ENCOUNTER — Ambulatory Visit: Payer: Medicare Other | Admitting: Oncology

## 2017-03-06 ENCOUNTER — Ambulatory Visit: Payer: Medicare Other

## 2017-03-06 MED FILL — TRUVADA 200-300 MG TABS: 200-300 | 30 days supply | Qty: 30 | Fill #3

## 2017-03-12 ENCOUNTER — Other Ambulatory Visit: Payer: Self-pay | Admitting: *Deleted

## 2017-03-17 ENCOUNTER — Telehealth: Payer: Self-pay | Admitting: Pharmacist Clinician (PhC)/ Clinical Pharmacy Specialist

## 2017-03-17 MED ORDER — BICTEGRAVIR-EMTRICITAB-TENOFOV 50-200-25 MG PO TABS: 1.0000 | ORAL_TABLET | Freq: Every day | ORAL | 6 refills | Status: DC

## 2017-03-17 NOTE — Telephone Encounter (Signed)
Dean Carlson called to ask of why he was switched to the current regimen of DTG BID + TRV. Dr. [name] put him on that due to the drug interactions with his previous chemo of Xtandi. He is no longer on Xtandi but Taxotere. Interaction should not be any issue now that he is on the Taxotere. Offered him to switch to [employer] instead. He agreed to it.

## 2017-03-20 ENCOUNTER — Inpatient Hospital Stay: Payer: Medicare Other

## 2017-03-20 ENCOUNTER — Telehealth: Payer: Self-pay | Admitting: Oncology

## 2017-03-20 ENCOUNTER — Other Ambulatory Visit: Payer: Self-pay | Admitting: *Deleted

## 2017-03-20 ENCOUNTER — Inpatient Hospital Stay: Payer: Medicare Other | Attending: Oncology | Admitting: Oncology

## 2017-03-20 DIAGNOSIS — C61 Malignant neoplasm of prostate: Secondary | ICD-10-CM

## 2017-03-20 DIAGNOSIS — C7951 Secondary malignant neoplasm of bone: Principal | ICD-10-CM

## 2017-03-20 DIAGNOSIS — Z923 Personal history of irradiation: Secondary | ICD-10-CM

## 2017-03-20 DIAGNOSIS — Z95828 Presence of other vascular implants and grafts: Secondary | ICD-10-CM | POA: Insufficient documentation

## 2017-03-20 DIAGNOSIS — C775 Secondary and unspecified malignant neoplasm of intrapelvic lymph nodes: Secondary | ICD-10-CM | POA: Diagnosis not present

## 2017-03-20 DIAGNOSIS — Z5111 Encounter for antineoplastic chemotherapy: Secondary | ICD-10-CM | POA: Insufficient documentation

## 2017-03-20 DIAGNOSIS — M25551 Pain in right hip: Secondary | ICD-10-CM

## 2017-03-20 DIAGNOSIS — Z7189 Other specified counseling: Secondary | ICD-10-CM

## 2017-03-20 LAB — COMPREHENSIVE METABOLIC PANEL
ALBUMIN: 3.9 g/dL (ref 3.5–5.0)
ALK PHOS: 542 U/L — AB (ref 40–150)
ALT: 13 U/L (ref 0–55)
ANION GAP: 9 (ref 3–11)
AST: 14 U/L (ref 5–34)
BUN: 11 mg/dL (ref 7–26)
CO2: 26 mmol/L (ref 22–29)
CREATININE: 0.8 mg/dL (ref 0.70–1.30)
Calcium: 9.3 mg/dL (ref 8.4–10.4)
Chloride: 105 mmol/L (ref 98–109)
GFR calc Af Amer: >60 mL/min (ref >60)
GFR calc non Af Amer: >60 mL/min (ref >60)
GLUCOSE: 91 mg/dL (ref 70–140)
Potassium: 3.4 mmol/L — ABNORMAL LOW (ref 3.5–5.1)
SODIUM: 140 mmol/L (ref 136–145)
Total Bilirubin: 0.3 mg/dL (ref 0.2–1.2)
Total Protein: 7.7 g/dL (ref 6.4–8.3)

## 2017-03-20 LAB — CBC WITH DIFFERENTIAL/PLATELET
Basophils Absolute: 0 10*3/uL (ref 0.0–0.1)
Basophils Relative: 0 %
Eosinophils Absolute: 0 10*3/uL (ref 0.0–0.5)
Eosinophils Relative: 1 %
HEMATOCRIT: 35.1 % — AB (ref 38.4–49.9)
Hemoglobin: 11.4 g/dL — ABNORMAL LOW (ref 13.0–17.1)
LYMPHS ABS: 1.9 10*3/uL (ref 0.9–3.3)
Lymphocytes Relative: 34 %
MCH: 28.6 pg (ref 27.2–33.4)
MCHC: 32.5 g/dL (ref 32.0–36.0)
MCV: 88 fL (ref 79.3–98.0)
MONO ABS: 0.8 10*3/uL (ref 0.1–0.9)
MONOS PCT: 13 %
NEUTROS ABS: 3 10*3/uL (ref 1.5–6.5)
Neutrophils Relative %: 52 %
Platelets: 295 10*3/uL (ref 140–400)
RBC: 3.99 MIL/uL — ABNORMAL LOW (ref 4.20–5.82)
RDW: 15.6 % (ref 11.0–15.6)
WBC: 5.7 10*3/uL (ref 4.0–10.3)

## 2017-03-20 MED ORDER — SODIUM CHLORIDE 0.9 % IV SOLN
Freq: Once | INTRAVENOUS | Status: AC
  Administered 2017-03-20: 14:00:00 via INTRAVENOUS

## 2017-03-20 MED ORDER — HEPARIN SOD (PORK) LOCK FLUSH 100 UNIT/ML IV SOLN
500.0000 [IU] | Freq: Once | INTRAVENOUS | Status: AC | PRN
  Administered 2017-03-20: 500 [IU]
  Filled 2017-03-20: qty 5

## 2017-03-20 MED ORDER — SODIUM CHLORIDE 0.9% FLUSH
10.0000 mL | INTRAVENOUS | Status: DC | PRN
  Administered 2017-03-20: 10 mL via INTRAVENOUS
  Filled 2017-03-20: qty 10

## 2017-03-20 MED ORDER — OXYCODONE-ACETAMINOPHEN 5-325 MG PO TABS: 1.0000 | ORAL_TABLET | Freq: Four times a day (QID) | ORAL | 0 refills | Status: DC | PRN

## 2017-03-20 MED ORDER — PEGFILGRASTIM 6 MG/0.6ML ~~LOC~~ PSKT
PREFILLED_SYRINGE | SUBCUTANEOUS | Status: AC
  Filled 2017-03-20: qty 0.6

## 2017-03-20 MED ORDER — PEGFILGRASTIM 6 MG/0.6ML ~~LOC~~ PSKT
6.0000 mg | PREFILLED_SYRINGE | Freq: Once | SUBCUTANEOUS | Status: AC
  Administered 2017-03-20: 6 mg via SUBCUTANEOUS

## 2017-03-20 MED ORDER — DEXAMETHASONE SODIUM PHOSPHATE 10 MG/ML IJ SOLN
INTRAMUSCULAR | Status: AC
  Filled 2017-03-20: qty 1

## 2017-03-20 MED ORDER — DEXAMETHASONE SODIUM PHOSPHATE 10 MG/ML IJ SOLN
10.0000 mg | Freq: Once | INTRAMUSCULAR | Status: AC
  Administered 2017-03-20: 10 mg via INTRAVENOUS

## 2017-03-20 MED ORDER — SODIUM CHLORIDE 0.9 % IV SOLN
60.0000 mg/m2 | Freq: Once | INTRAVENOUS | Status: AC
  Administered 2017-03-20: 130 mg via INTRAVENOUS
  Filled 2017-03-20: qty 13

## 2017-03-20 MED ORDER — CYCLOBENZAPRINE HCL 10 MG PO TABS: 10.0000 mg | ORAL_TABLET | Freq: Two times a day (BID) | ORAL | 0 refills | Status: AC | PRN

## 2017-03-20 MED ORDER — SODIUM CHLORIDE 0.9% FLUSH
10.0000 mL | INTRAVENOUS | Status: DC | PRN
  Administered 2017-03-20: 10 mL
  Filled 2017-03-20: qty 10

## 2017-03-20 NOTE — Telephone Encounter (Signed)
Gave patient avs and calendar with appts per 11/8 los.  °

## 2017-03-20 NOTE — Progress Notes (Signed)
Hematology and Oncology Follow Up Visit  Dean Carlson [id number] 06/30/1960 57 y.o. 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 His Gleason score 4+5 = 9 and PSA at the time of diagnosis 230.    Prior Therapy: He was treated with Lupron therapy at the time of diagnosis and had an excellent response with PSA down to 0.97 back in August 2015.   His PSA did rise to 18.76 in April 2016, and started on [place] and tolerated it poorly. He reported muscle cramps and took it for one week only.    Zytiga for a total dose of 1000 mg daily started on 02/15/2015. His dose was changed to 500 mg starting on 08/30/2015. Therapy discontinued in January 2018 because of progression of disease.  Palliative radiation therapy to the right hip under the care of Dr. Tammi Klippel. Therapy concluded in February 2018 after receiving total of 30 gray to the right hip.  Xtandi 160 mg daily started in January 2018.  Therapy discontinued in November 2018 because of progression of disease.  Current therapy:  Taxotere chemotherapy 60 mg/m2 every 3 weeks started on 02/06/2017.  He is here for cycle 3 of therapy.  Interim History: [name] is by himself for a follow-up visit.  He received the last chemotherapy without any delayed complications.  He does report some mild fatigue and nausea that is manageable with antiemetics.  He reports his pain continues to improve in his hip although he does require Percocet at times.  His pain is predominantly with mobility but no pain at rest.  He is experiencing provement in his quality of life after the start of chemotherapy.  He denies any new pathological fractures or new pain.  He denies any change in his bowel habits.  He did report an episode of constipation and has resolved at this time.  He denies any  worsening neuropathy or infusion related complications.  His appetite has been reasonable although his stasis altered because of chemotherapy.  He is able to maintain his weight but has to find the appropriate meals to eat because of the taste alteration.   He does not report any headaches, blurry vision, syncope or seizures. Does not report any fevers, chills, sweats or weight loss. Does not report any chest pain, palpitation orthopnea or leg edema. Does not report any cough, hemoptysis or hematemesis. He does not report any early satiety.  Does not report any hematochezia or melena.  He does not report any frequency urgency or hesitancy.  Does not report any skeletal complaints of arthralgias or myalgias.  Is not report any heat or cold intolerance.  He does not report any skin rashes or lesions.  He does not report any lymphadenopathy or petechiae.  He does not report any easy bruising.  Remaining review of systems is negative.  Medications: No changes in his medication noted by my review. Current Outpatient Medications  Medication Sig Dispense Refill  . bictegravir-emtricitabine-tenofovir AF (BIKTARVY) 50-200-25 MG TABS tablet Take 1 tablet by mouth daily. 30 tablet 6  . CALCIUM PO Take 1 tablet by mouth daily.    Mar[place] Kitchen leuprolide (LUPRON) 30 MG injection Inject 30 mg into the muscle every 4 (four) months.    . lidocaine-prilocaine (EMLA) cream Apply a dime size to port-a-cath 1-2 hours prior to access. Do not rub in. Cover with [employer].  30 g 1  . prochlorperazine (COMPAZINE) 10 MG tablet Take 1 tablet (10 mg total) by mouth every 6 (six) hours as needed for nausea or vomiting. 30 tablet 0  . cyclobenzaprine (FLEXERIL) 10 MG tablet Take 1 tablet (10 mg total) by mouth 2 (two) times daily as needed for muscle spasms. 30 tablet 0  . oxyCODONE-acetaminophen (PERCOCET/ROXICET) 5-325 MG tablet Take 1-2 tablets by mouth every 6 (six) hours as needed for severe pain. 60 tablet 0   No current  facility-administered medications for this visit.      Allergies:  Allergies  Allergen Reactions  . Alka-Seltzer [Aspirin Effervescent] Other (See Comments)    asthma  . Excedrin Extra Strength [Aspirin-Acetaminophen-Caffeine] Other (See Comments)    REACTION: asthma    Past Medical History, Surgical history, Social history, and Family History were reviewed and updated.   Physical Exam: His blood pressure is 126/75, pulse is 81, respiration 20, temperature is 98.9.  His weight is 204.7 pounds.  ECOG: 1 General appearance: Comfortable appearing gentleman appeared without distress.. Head: Normocephalic, without obvious abnormality  Mouth: No oral ulcers or thrush.  No dental abnormalities noted Eyes: No scleral icterus.  Pupils are equal and round and reactive to light. Lymph nodes: Cervical, supraclavicular, and axillary nodes normal. Heart: No murmurs or gallops.  Regular rate and rhythm. Lung: clear, no wheezing, rales, normal symmetric air entry Abdomin: Good bowel sounds auscultated in all 4 quadrants.  No rebound or guarding.  Soft without tenderness. Musculoskeletal: No joint effusion or deformity.  Good range of motion noted. Skin: No rashes or lesions. Neurological: No motor, sensory deficits.  He is ambulating without difficulties.  Lab Results: Lab Results  Component Value Date   WBC 5.7 03/20/2017   HGB 11.4 (L) 03/20/2017   HCT 35.1 (L) 03/20/2017   MCV 88.0 03/20/2017   PLT 295 03/20/2017     Chemistry      Component Value Date/Time   NA 140 02/27/2017 1104   K 3.4 (L) 02/27/2017 1104   CL 100 01/19/2017 1121   CO2 28 02/27/2017 1104   BUN 9.1 02/27/2017 1104   CREATININE 0.8 02/27/2017 1104      Component Value Date/Time   CALCIUM 8.8 02/27/2017 1104   ALKPHOS 497 (H) 02/27/2017 1104   AST 13 02/27/2017 1104   ALT 12 02/27/2017 1104   BILITOT 0.28 02/27/2017 1104     Results for Dean, Carlson (MRN [id number]) as of 03/20/2017 12:36  Ref. Range  02/06/2017 11:47 02/27/2017 11:04  Prostate Specific Ag, Serum Latest Ref Range: 0.0 - 4.0 ng/mL 211.8 (H) 199.5 (H)     Impression and Plan:   58 year old gentleman with the following issues:  1. Castration resistant metastatic prostate cancer with disease to the bone as well as lymph nodes.  He is status post androgen deprivation therapy in addition to second line therapy with [place].  He is currently receiving Taxotere chemotherapy without complications after 2 cycles of therapy.  He is clinical status is continuing to improve with improvement in his pain especially at rest.  His PSA is showing slight decline currently down to 199 from 211.   2. Androgen depravation: We will measure testosterone and treat with androgen deprivation therapy if his testosterone is not castrate.  3. HIV: Followed by Dr. [name].  No changes in his medication while he is receiving Taxotere chemotherapy.  4.  Neutropenia prophylaxis: He will receive Neulasta Onpro after each treatment.  His white cell count  is normal today and has not fevers or infections.  5. Right hip pain: Improved after starting systemic chemotherapy.  He still using Percocet as needed and his usage has decreased after the start of therapy.  6.  IV access: Port-A-Cath used without complications.  7.  Goals of care: Therapy is palliative at this time.  His performance status remains maintained and it is reasonable to continue with aggressive therapy at this time.    8.  Antiemetics: Prescription for Compazine was given to the patient with instruction how to use it.  9. Follow-up: Will be in 3 weeks for the next cycle of chemotherapy.   25 minutes was spent with the patient face-to-face today.  More than 50% of time was dedicated to patient counseling, education and coordination of his multifaceted care.    Zola Button, MD 1/18/201912:57 PM

## 2017-03-21 LAB — PROSTATE-SPECIFIC AG, SERUM (LABCORP): PROSTATE SPECIFIC AG, SERUM: 185.8 ng/mL — AB (ref 0.0–4.0)

## 2017-03-23 ENCOUNTER — Ambulatory Visit (INDEPENDENT_AMBULATORY_CARE_PROVIDER_SITE_OTHER): Payer: Medicare Other | Admitting: Infectious Disease

## 2017-03-23 ENCOUNTER — Encounter: Payer: Self-pay | Admitting: Infectious Disease

## 2017-03-23 VITALS — BP 132/83 | HR 109 | Temp 98.3°F | Wt 204.0 lb

## 2017-03-23 DIAGNOSIS — G4700 Insomnia, unspecified: Secondary | ICD-10-CM | POA: Diagnosis not present

## 2017-03-23 DIAGNOSIS — F4323 Adjustment disorder with mixed anxiety and depressed mood: Secondary | ICD-10-CM

## 2017-03-23 DIAGNOSIS — B20 Human immunodeficiency virus [HIV] disease: Secondary | ICD-10-CM

## 2017-03-23 DIAGNOSIS — C7951 Secondary malignant neoplasm of bone: Secondary | ICD-10-CM | POA: Diagnosis not present

## 2017-03-23 DIAGNOSIS — C61 Malignant neoplasm of prostate: Secondary | ICD-10-CM

## 2017-03-23 NOTE — Progress Notes (Signed)
Chief complaint: Follow-up for his HIV disease on medications  Subjective:    Patient ID: Dean Carlson, male    DOB: 1960-12-22, 57 y.o.   MRN: [id number]  HPI  Dean Carlson is 57 year old man with HIV, prostate cancer that was diagnosed while he was on testosterone. He has been followed closely by urology and now by Oncology with Dr. Barbaraann Faster   PSA is rising again and PET scan shows multiple lytic lesions in bone that have progressed.   He  On palliative chemotherapy with  Taxotere with Dr. Barbaraann Faster and has had improvement in his symptoms of bone pain with reduction in size of his tumor burden  We have changed his regimen once again now back to [facility].  We have also initiated trazodone for insomnia.  We will plan on rechecking his labs in 1 month's time after he has been on the [facility] for at least a month.  He is also on Neulasta to help stimulate his neutrophils have some anxiety that may be his CD4 cell might be dropping to in the context of his chemotherapy will therefore check labs in 1 month we will recheck his viral load.   Lab Results  Component Value Date   HIV1RNAQUANT <20 NOT DETECTED 01/19/2017   HIV1RNAQUANT <20 NOT DETECTED 10/06/2016   HIV1RNAQUANT <20 10/05/2015    Lab Results  Component Value Date   CD4TABS 450 01/19/2017   CD4TABS 490 10/06/2016   CD4TABS 310 (L) 04/30/2016     Past Medical History:  Diagnosis Date  . Adjustment disorder with mixed anxiety and depressed mood 05/09/2015  . Asthma    as a child  . Dizziness 05/09/2015  . Grief 05/09/2015  . HIV (human immunodeficiency virus infection) ([place])   . Insomnia 05/09/2015  . Memory deficit 09/05/2016  . Nausea without vomiting 10/20/2016  . Prostate cancer ([place])   . Weight gain 05/09/2015    Past Surgical History:  Procedure Laterality Date  . COLONOSCOPY  05/13/2011   Procedure: COLONOSCOPY;  Surgeon: Jerene Bears, MD;  Location: [facility]OS[facility];  Service: Gastroenterology;  Laterality: N/A;  . IR FLUORO  GUIDE PORT INSERTION RIGHT  01/21/2017  . IR US GUIDE VASC ACCESS RIGHT  01/21/2017  . PROSTATE BIOPSY  08/16/2012    Family History  Problem Relation Age of Onset  . Diabetes Unknown   . Cancer Neg Hx       Social History   Socioeconomic History  . Marital status: Legally Separated    Spouse name: None  . Number of children: None  . Years of education: None  . Highest education level: None  Social Needs  . Financial resource strain: None  . Food insecurity - worry: None  . Food insecurity - inability: None  . Transportation needs - medical: None  . Transportation needs - non-medical: None  Occupational History  . Occupation: permanant disability  Tobacco Use  . Smoking status: Former Smoker    Packs/day: 1.00    Types: Cigarettes    Last attempt to quit: 03/15/2011    Years since quitting: 6.0  . Smokeless tobacco: Never Used  Substance and Sexual Activity  . Alcohol use: No    Alcohol/week: 0.0 oz    Comment: occ  . Drug use: No  . Sexual activity: Not Currently  Other Topics Concern  . None  Social History Narrative  . None    Allergies  Allergen Reactions  . Alka-Seltzer [Aspirin Effervescent] Other (See Comments)  asthma  . Excedrin Extra Strength [Aspirin-Acetaminophen-Caffeine] Other (See Comments)    REACTION: asthma     Current Outpatient Medications:  .  bictegravir-emtricitabine-tenofovir AF (BIKTARVY) 50-200-25 MG TABS tablet, Take 1 tablet by mouth daily., Disp: 30 tablet, Rfl: 6 .  CALCIUM PO, Take 1 tablet by mouth daily., Disp: , Rfl:  .  cyclobenzaprine (FLEXERIL) 10 MG tablet, Take 1 tablet (10 mg total) by mouth 2 (two) times daily as needed for muscle spasms., Disp: 30 tablet, Rfl: 0 .  leuprolide (LUPRON) 30 MG injection, Inject 30 mg into the muscle every 4 (four) months., Disp: , Rfl:  .  lidocaine-prilocaine (EMLA) cream, Apply a dime size to port-a-cath 1-2 hours prior to access. Do not rub in. Cover with [employer]., Disp: 30 g,  Rfl: 1 .  oxyCODONE-acetaminophen (PERCOCET/ROXICET) 5-325 MG tablet, Take 1-2 tablets by mouth every 6 (six) hours as needed for severe pain., Disp: 60 tablet, Rfl: 0 .  prochlorperazine (COMPAZINE) 10 MG tablet, Take 1 tablet (10 mg total) by mouth every 6 (six) hours as needed for nausea or vomiting., Disp: 30 tablet, Rfl: 0    Review of Systems  Constitutional: Negative for activity change, appetite change, chills, diaphoresis, fatigue, fever and unexpected weight change.  HENT: Negative for congestion, rhinorrhea, sinus pressure, sneezing, sore throat and trouble swallowing.   Eyes: Negative for photophobia and visual disturbance.  Respiratory: Negative for cough, chest tightness, shortness of breath, wheezing and stridor.   Cardiovascular: Negative for chest pain, palpitations and leg swelling.  Gastrointestinal: Negative for abdominal distention, abdominal pain, anal bleeding, blood in stool, constipation, diarrhea, nausea and vomiting.  Genitourinary: Negative for difficulty urinating, dysuria, flank pain and hematuria.  Musculoskeletal: Positive for arthralgias, back pain, myalgias and neck stiffness. Negative for gait problem and joint swelling.  Skin: Negative for color change, pallor, rash and wound.  Neurological: Negative for dizziness, tremors, weakness and light-headedness.  Hematological: Negative for adenopathy. Does not bruise/bleed easily.  Psychiatric/Behavioral: Positive for dysphoric mood. Negative for agitation, behavioral problems, confusion, self-injury and sleep disturbance. The patient is not nervous/anxious.        Objective:   Physical Exam  Constitutional: He is oriented to person, place, and time. He appears well-developed and well-nourished.  HENT:  Head: Normocephalic and atraumatic.  Eyes: Conjunctivae and EOM are normal.  Neck: Normal range of motion. Neck supple.  Cardiovascular: Normal rate and regular rhythm.  Pulmonary/Chest: Effort normal. No  respiratory distress. He has no wheezes.  Abdominal: Soft. He exhibits no distension.  Musculoskeletal: Normal range of motion. He exhibits no edema or tenderness.  Neurological: He is alert and oriented to person, place, and time.  Skin: Skin is warm and dry. No rash noted. No erythema. No pallor.  Psychiatric: Judgment and thought content normal. Cognition and memory are normal. He exhibits a depressed mood.  Much more calm today          Assessment & Plan:   HIV: We have switched him to [facility] and we will check labs in 1 month's time his CD4 may drop in the context of his chemotherapy and I would then in that case institute PCP prophylaxis  Prostate cancer: Greatly appreciate the conference of care being provided by Dr. Barbaraann Faster was following him closely.  Grief and depression: continue supportive care.  He did not want to see Judeen Hammans today  Painful bony mets: on opiates to help with this, pain is improved with chemotherapy  I spent greater than 25 minutes with  the patient including greater than 50% of time in face to face counsel of the patient and his sister who accompanied him regarding his HIV his new antiretroviral his metastatic prostate cancer and the problems that occurred when we were replating his testosterone in years past and in coordination of his care.

## 2017-03-30 MED FILL — ONDANSETRON HCL 4 MG TABLET: 4 | 30 days supply | Qty: 90 | Fill #0

## 2017-04-03 ENCOUNTER — Other Ambulatory Visit: Payer: Self-pay | Admitting: Pharmacist

## 2017-04-10 ENCOUNTER — Inpatient Hospital Stay: Payer: Medicare Other

## 2017-04-10 ENCOUNTER — Ambulatory Visit: Payer: Medicare Other

## 2017-04-10 ENCOUNTER — Other Ambulatory Visit: Payer: Self-pay

## 2017-04-10 ENCOUNTER — Inpatient Hospital Stay: Payer: Medicare Other | Attending: Oncology | Admitting: Oncology

## 2017-04-10 ENCOUNTER — Encounter: Payer: Medicare Other | Admitting: Medical Oncology

## 2017-04-10 ENCOUNTER — Telehealth: Payer: Self-pay | Admitting: Oncology

## 2017-04-10 VITALS — BP 142/77 | HR 90 | Temp 98.7°F | Resp 17 | Ht 71.0 in | Wt 216.3 lb

## 2017-04-10 DIAGNOSIS — C7951 Secondary malignant neoplasm of bone: Secondary | ICD-10-CM

## 2017-04-10 DIAGNOSIS — G893 Neoplasm related pain (acute) (chronic): Secondary | ICD-10-CM | POA: Diagnosis not present

## 2017-04-10 DIAGNOSIS — Z7189 Other specified counseling: Secondary | ICD-10-CM

## 2017-04-10 DIAGNOSIS — Z5111 Encounter for antineoplastic chemotherapy: Secondary | ICD-10-CM | POA: Insufficient documentation

## 2017-04-10 DIAGNOSIS — C61 Malignant neoplasm of prostate: Secondary | ICD-10-CM

## 2017-04-10 DIAGNOSIS — Z923 Personal history of irradiation: Secondary | ICD-10-CM | POA: Insufficient documentation

## 2017-04-10 DIAGNOSIS — E291 Testicular hypofunction: Secondary | ICD-10-CM | POA: Diagnosis not present

## 2017-04-10 DIAGNOSIS — B20 Human immunodeficiency virus [HIV] disease: Secondary | ICD-10-CM | POA: Diagnosis not present

## 2017-04-10 DIAGNOSIS — Z95828 Presence of other vascular implants and grafts: Secondary | ICD-10-CM

## 2017-04-10 LAB — CBC WITH DIFFERENTIAL/PLATELET
BASOS ABS: 0.1 10*3/uL (ref 0.0–0.1)
BASOS PCT: 1 %
Eosinophils Absolute: 0 10*3/uL (ref 0.0–0.5)
Eosinophils Relative: 0 %
HCT: 31 % — ABNORMAL LOW (ref 38.4–49.9)
Hemoglobin: 10.1 g/dL — ABNORMAL LOW (ref 13.0–17.1)
LYMPHS PCT: 26 %
Lymphs Abs: 1.5 10*3/uL (ref 0.9–3.3)
MCH: 29.2 pg (ref 27.2–33.4)
MCHC: 32.7 g/dL (ref 32.0–36.0)
MCV: 89.2 fL (ref 79.3–98.0)
Monocytes Absolute: 1 10*3/uL — ABNORMAL HIGH (ref 0.1–0.9)
Monocytes Relative: 17 %
NEUTROS ABS: 3.2 10*3/uL (ref 1.5–6.5)
Neutrophils Relative %: 56 %
PLATELETS: 329 10*3/uL (ref 140–400)
RBC: 3.48 MIL/uL — AB (ref 4.20–5.82)
RDW: 18.5 % — ABNORMAL HIGH (ref 11.0–14.6)
WBC: 5.8 10*3/uL (ref 4.0–10.3)

## 2017-04-10 LAB — COMPREHENSIVE METABOLIC PANEL
ALT: 9 U/L (ref 0–55)
ANION GAP: 6 (ref 3–11)
AST: 13 U/L (ref 5–34)
Albumin: 3.4 g/dL — ABNORMAL LOW (ref 3.5–5.0)
Alkaline Phosphatase: 477 U/L — ABNORMAL HIGH (ref 40–150)
BILIRUBIN TOTAL: 0.2 mg/dL (ref 0.2–1.2)
BUN: 5 mg/dL — ABNORMAL LOW (ref 7–26)
CO2: 31 mmol/L — ABNORMAL HIGH (ref 22–29)
Calcium: 8.7 mg/dL (ref 8.4–10.4)
Chloride: 102 mmol/L (ref 98–109)
Creatinine, Ser: 0.68 mg/dL — ABNORMAL LOW (ref 0.70–1.30)
GFR calc Af Amer: >60 mL/min (ref >60)
Glucose, Bld: 82 mg/dL (ref 70–140)
POTASSIUM: 3.9 mmol/L (ref 3.5–5.1)
Sodium: 139 mmol/L (ref 136–145)
TOTAL PROTEIN: 6.9 g/dL (ref 6.4–8.3)

## 2017-04-10 MED ORDER — HEPARIN SOD (PORK) LOCK FLUSH 100 UNIT/ML IV SOLN
500.0000 [IU] | Freq: Once | INTRAVENOUS | Status: AC | PRN
  Administered 2017-04-10: 500 [IU]
  Filled 2017-04-10: qty 5

## 2017-04-10 MED ORDER — SODIUM CHLORIDE 0.9% FLUSH
10.0000 mL | INTRAVENOUS | Status: DC | PRN
  Administered 2017-04-10: 10 mL via INTRAVENOUS
  Filled 2017-04-10: qty 10

## 2017-04-10 MED ORDER — SODIUM CHLORIDE 0.9 % IV SOLN
60.0000 mg/m2 | Freq: Once | INTRAVENOUS | Status: AC
  Administered 2017-04-10: 130 mg via INTRAVENOUS
  Filled 2017-04-10: qty 13

## 2017-04-10 MED ORDER — PEGFILGRASTIM 6 MG/0.6ML ~~LOC~~ PSKT
PREFILLED_SYRINGE | SUBCUTANEOUS | Status: AC
  Filled 2017-04-10: qty 0.6

## 2017-04-10 MED ORDER — DEXAMETHASONE SODIUM PHOSPHATE 10 MG/ML IJ SOLN
10.0000 mg | Freq: Once | INTRAMUSCULAR | Status: AC
  Administered 2017-04-10: 10 mg via INTRAVENOUS

## 2017-04-10 MED ORDER — DEXAMETHASONE SODIUM PHOSPHATE 10 MG/ML IJ SOLN
INTRAMUSCULAR | Status: AC
  Filled 2017-04-10: qty 1

## 2017-04-10 MED ORDER — SODIUM CHLORIDE 0.9% FLUSH
10.0000 mL | INTRAVENOUS | Status: DC | PRN
  Administered 2017-04-10: 10 mL
  Filled 2017-04-10: qty 10

## 2017-04-10 MED ORDER — SODIUM CHLORIDE 0.9 % IV SOLN
Freq: Once | INTRAVENOUS | Status: AC
  Administered 2017-04-10: 13:00:00 via INTRAVENOUS

## 2017-04-10 MED ORDER — PEGFILGRASTIM 6 MG/0.6ML ~~LOC~~ PSKT
6.0000 mg | PREFILLED_SYRINGE | Freq: Once | SUBCUTANEOUS | Status: AC
  Administered 2017-04-10: 6 mg via SUBCUTANEOUS

## 2017-04-10 MED FILL — BIKTARVY 50-200-25 MG TABS: 50-200-25 | 30 days supply | Qty: 30 | Fill #0

## 2017-04-10 NOTE — Telephone Encounter (Signed)
Gave avs and calendar for march and april °

## 2017-04-10 NOTE — Progress Notes (Signed)
Hematology and Oncology Follow Up Visit  Dean Carlson [id number] 01-20-1961 57 y.o. 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  His initial diagnosis was in 2014 with Gleason score 4+5 = 9 and PSA at the time of diagnosis 230.    Prior Therapy: He was treated with Lupron therapy at the time of diagnosis and had an excellent response with PSA down to 0.97 back in August 2015.   He developed castration resistant disease and PSA did rise to 18.76 in April 2016. Dean Carlson was started and discontinued because of poor tolerance.     Zytiga for a total dose of 1000 mg daily started on 02/15/2015. His dose was changed to 500 mg starting on 08/30/2015. Therapy discontinued in January 2018 because of progression of disease.  Palliative radiation therapy to the right hip under the care of Dr. Tammi Klippel. Therapy concluded in February 2018 after receiving total of 30 gray to the right hip.  Xtandi 160 mg daily started in January 2018.  Therapy discontinued in November 2018 because of progression of disease.  Current therapy:  Taxotere chemotherapy 60 mg/m2 every 3 weeks started on 02/06/2017.  He is here for cycle 4 of therapy.  Interim History: Dean Carlson presents today for a follow-up visit.  He reports feeling reasonably well since the last visit.  He continues to show improvement in his overall clinical status since the start of chemotherapy.  He reports his hip pain is improved although still limiting his full mobility.  He denies any other complications related to chemotherapy.  He does report grade 1 fatigue but no nausea, vomiting or worsening neuropathy.  His hip pain continues to be graded somewhere between 2-5 out of 10 in response to hydrocodone.  He denies any falls or syncope.  He denies any bone pain or pathological fractures.   He denies any joint swelling or effusions.  He denies any arthralgias or myalgias.  His appetite has been better and have gained more weight.  His quality of life appears to have improved since the start of chemotherapy.  He denied any recent hospitalizations or illnesses.  He does not report any headaches, blurry vision, syncope or seizures. Does not report any fevers, chills, sweats or weight loss. Does not report any chest pain, palpitation orthopnea or leg edema. Does not report any cough, hemoptysis or hematemesis. He does not report any early satiety.  Does not report any hematochezia or melena.  He does not report any frequency urgency or hesitancy.  Is not report any heat or cold intolerance.  He does not report any skin rashes or lesions.  He does not report any lymphadenopathy or petechiae.  He does not report any easy bruising.  He denies any anxiety or depression.  Remaining review of systems is negative.  Medications: No changes in his medication are unchanged by my review. Current Outpatient Medications  Medication Sig Dispense Refill  . bictegravir-emtricitabine-tenofovir AF (BIKTARVY) 50-200-25 MG TABS tablet Take 1 tablet by mouth daily. 30 tablet 6  . CALCIUM PO Take 1 tablet by mouth daily.    . cyclobenzaprine (FLEXERIL) 10 MG tablet Take 1 tablet (10 mg total) by mouth 2 (two) times daily as needed for muscle spasms. 30 tablet 0  . leuprolide (LUPRON) 30 MG injection Inject 30 mg into the muscle every 4 (four) months.    Marland Kitchen  lidocaine-prilocaine (EMLA) cream Apply a dime size to port-a-cath 1-2 hours prior to access. Do not rub in. Cover with [employer]. 30 g 1  . oxyCODONE-acetaminophen (PERCOCET/ROXICET) 5-325 MG tablet Take 1-2 tablets by mouth every 6 (six) hours as needed for severe pain. 60 tablet 0  . prochlorperazine (COMPAZINE) 10 MG tablet Take 1 tablet (10 mg total) by mouth every 6 (six) hours as needed for nausea or vomiting. 30 tablet 0   No current  facility-administered medications for this visit.      Allergies:  Allergies  Allergen Reactions  . Alka-Seltzer [Aspirin Effervescent] Other (See Comments)    asthma  . Excedrin Extra Strength [Aspirin-Acetaminophen-Caffeine] Other (See Comments)    REACTION: asthma    Past Medical History, Surgical history, Social history, and Family History reviewed again today and unchanged.   Physical Exam: Blood pressure (!) 142/77, pulse 90, temperature 98.7 F (37.1 C), temperature source Oral, resp. rate 17, height 5\' 11"  (1.803 m), weight 216 lb 4.8 oz (98.1 kg), SpO2 100 %.    ECOG: 1 General appearance: Well-appearing gentleman appeared without distress. Head: Normocephalic, without obvious abnormality atraumatic. Oral mucosa: Mucous membranes are moist and pink.  No thrush or ulcers. Eyes:   Pupils are equal and round and reactive to light.  Sclera is clear and not injected. Lymph nodes: Cervical, supraclavicular, and axillary nodes normal. Heart: Regular rate and rhythm without murmurs rubs or gallops. Lung: clear in all lung fields, no wheezing, rales, or dullness to percussion. Abdomin: Soft, nontender with good bowel sounds in all 4 quadrants.  No rebound or guarding. Musculoskeletal: Limited range of motion in his right hip.  No joint deformity or effusion. Skin: No rashes or lesions.  No ecchymosis or petechiae. Neurological: No motor, sensory deficits.  Deep tendon reflexes intact. Mood: Appropriate affect and behavior.  Lab Results: Lab Results  Component Value Date   WBC 5.8 04/10/2017   HGB 10.1 (L) 04/10/2017   HCT 31.0 (L) 04/10/2017   MCV 89.2 04/10/2017   PLT 329 04/10/2017     Chemistry      Component Value Date/Time   NA 139 04/10/2017 1126   NA 140 02/27/2017 1104   K 3.9 04/10/2017 1126   K 3.4 (L) 02/27/2017 1104   CL 102 04/10/2017 1126   CO2 31 (H) 04/10/2017 1126   CO2 28 02/27/2017 1104   BUN 5 (L) 04/10/2017 1126   BUN 9.1 02/27/2017 1104    CREATININE 0.68 (L) 04/10/2017 1126   CREATININE 0.8 02/27/2017 1104      Component Value Date/Time   CALCIUM 8.7 04/10/2017 1126   CALCIUM 8.8 02/27/2017 1104   ALKPHOS 477 (H) 04/10/2017 1126   ALKPHOS 497 (H) 02/27/2017 1104   AST 13 04/10/2017 1126   AST 13 02/27/2017 1104   ALT 9 04/10/2017 1126   ALT 12 02/27/2017 1104   BILITOT 0.2 04/10/2017 1126   BILITOT 0.28 02/27/2017 1104     Results for TYRELL, SEIFER (MRN [id number]) as of 04/10/2017 12:29  Ref. Range 02/27/2017 11:04 03/20/2017 11:44  Prostate Specific Ag, Serum Latest Ref Range: 0.0 - 4.0 ng/mL 199.5 (H) 185.8 (H)      Impression and Plan:   57 year old gentleman with the following issues:  1. Castration resistant metastatic prostate cancer with predominant bone disease that is symptomatic.  He has received multiple therapy outlined above and has progressed on hormone therapy and second line hormone manipulation.  He is currently receiving Taxotere chemotherapy with excellent  response so far.  His PSA continues to decline and currently at 185.8.  He is experiencing excellent clinical response with improvement in his quality of life, appetite and pain levels.  The rationale for continuing chemotherapy as well as risks and benefits were reviewed today.  Given his excellent benefit at this time and reasonable response I would recommend continuing this treatment to at least 10 cycles if he can tolerate it.  He is agreeable to continue at this time.  2. Androgen depravation: Testosterone level is pending from today and will reintroduce Lupron if his testosterone level is not castrate.  3. HIV: No changes in his medication noted at this time.  He continues to be followed by Dr. Tommy Carlson.    4.  Neutropenia prophylaxis: He is at risk of developing neutropenic fever and sepsis related to chemotherapy.  He will receive Neulasta Onpro after each treatment.    5. Right hip pain: Related to prostate cancer metastasis.  Improved  after starting systemic chemotherapy and pain is manageable with the current dose of hydrocodone.    6.  IV access: Port-A-Cath remained in place without complications since last visit.    7.  Goals of care: He has an incurable malignancy but a disease that can be palliated adequately with aggressive therapy.  His performance status is adequate and aggressive therapy continues to be warranted.  e.    8.  Antiemetics: No nausea or vomiting reported since last visit.  Prescription for Compazine is available to him to use as needed.  9. Follow-up: Will be in 3 to 4  weeks for the next cycle of chemotherapy.   25 minutes was spent with the patient face-to-face today.  More than 50% of time was dedicated to patient counseling, education and answering questions regarding diagnosis, prognosis and future coordination of his care.    Zola Button, MD 2/8/201912:38 PM

## 2017-04-10 NOTE — Patient Instructions (Signed)
[facility] Discharge Instructions for Patients Receiving Chemotherapy  Today you received the following chemotherapy agents: Taxotere  To help prevent nausea and vomiting after your treatment, we encourage you to take your nausea medication as directed.    If you develop nausea and vomiting that is not controlled by your nausea medication, call the clinic.   BELOW ARE SYMPTOMS THAT SHOULD BE REPORTED IMMEDIATELY:  *FEVER GREATER THAN 100.5 F  *CHILLS WITH OR WITHOUT FEVER  NAUSEA AND VOMITING THAT IS NOT CONTROLLED WITH YOUR NAUSEA MEDICATION  *UNUSUAL SHORTNESS OF BREATH  *UNUSUAL BRUISING OR BLEEDING  TENDERNESS IN MOUTH AND THROAT WITH OR WITHOUT PRESENCE OF ULCERS  *URINARY PROBLEMS  *BOWEL PROBLEMS  UNUSUAL RASH Items with * indicate a potential emergency and should be followed up as soon as possible.  Feel free to call the clinic should you have any questions or concerns. The clinic phone number is [phone].  Please show the CHEMO ALERT CARD at check-in to the Emergency Department and triage nurse.   

## 2017-04-10 NOTE — Patient Instructions (Signed)
Implanted Port Home Guide An implanted port is a type of central line that is placed under the skin. Central lines are used to provide IV access when treatment or nutrition needs to be given through a person's veins. Implanted ports are used for long-term IV access. An implanted port may be placed because:  You need IV medicine that would be irritating to the small veins in your hands or arms.  You need long-term IV medicines, such as antibiotics.  You need IV nutrition for a long period.  You need frequent blood draws for lab tests.  You need dialysis.  Implanted ports are usually placed in the chest area, but they can also be placed in the upper arm, the abdomen, or the leg. An implanted port has two main parts:  Reservoir. The reservoir is round and will appear as a small, raised area under your skin. The reservoir is the part where a needle is inserted to give medicines or draw blood.  Catheter. The catheter is a thin, flexible tube that extends from the reservoir. The catheter is placed into a large vein. Medicine that is inserted into the reservoir goes into the catheter and then into the vein.  How will I care for my incision site? Do not get the incision site wet. Bathe or shower as directed by your health care provider. How is my port accessed? Special steps must be taken to access the port:  Before the port is accessed, a numbing cream can be placed on the skin. This helps numb the skin over the port site.  Your health care provider uses a sterile technique to access the port. ? Your health care provider must put on a mask and sterile gloves. ? The skin over your port is cleaned carefully with an antiseptic and allowed to dry. ? The port is gently pinched between sterile gloves, and a needle is inserted into the port.  Only "non-coring" port needles should be used to access the port. Once the port is accessed, a blood return should be checked. This helps ensure that the port  is in the vein and is not clogged.  If your port needs to remain accessed for a constant infusion, a clear (transparent) bandage will be placed over the needle site. The bandage and needle will need to be changed every week, or as directed by your health care provider.  Keep the bandage covering the needle clean and dry. Do not get it wet. Follow your health care provider's instructions on how to take a shower or bath while the port is accessed.  If your port does not need to stay accessed, no bandage is needed over the port.  What is flushing? Flushing helps keep the port from getting clogged. Follow your health care provider's instructions on how and when to flush the port. Ports are usually flushed with saline solution or a medicine called heparin. The need for flushing will depend on how the port is used.  If the port is used for intermittent medicines or blood draws, the port will need to be flushed: ? After medicines have been given. ? After blood has been drawn. ? As part of routine maintenance.  If a constant infusion is running, the port may not need to be flushed.  How long will my port stay implanted? The port can stay in for as long as your health care provider thinks it is needed. When it is time for the port to come out, surgery will be   done to remove it. The procedure is similar to the one performed when the port was put in. When should I seek immediate medical care? When you have an implanted port, you should seek immediate medical care if:  You notice a bad smell coming from the incision site.  You have swelling, redness, or drainage at the incision site.  You have more swelling or pain at the port site or the surrounding area.  You have a fever that is not controlled with medicine.  This information is not intended to replace advice given to you by your health care provider. Make sure you discuss any questions you have with your health care provider. Document  Released: 02/17/2005 Document Revised: 07/26/2015 Document Reviewed: 10/25/2012 Elsevier Interactive Patient Education  2017 [employer]  

## 2017-04-11 LAB — PROSTATE-SPECIFIC AG, SERUM (LABCORP): Prostate Specific Ag, Serum: 198 ng/mL — ABNORMAL HIGH (ref 0.0–4.0)

## 2017-04-11 LAB — TESTOSTERONE: Testosterone: 23 ng/dL — ABNORMAL LOW (ref 264–916)

## 2017-04-21 ENCOUNTER — Other Ambulatory Visit (HOSPITAL_COMMUNITY): Payer: Self-pay | Admitting: Pharmacy Technician

## 2017-04-23 MED FILL — PROCHLORPERAZINE 10 MG TAB: 10 | 7 days supply | Qty: 30 | Fill #0

## 2017-04-24 ENCOUNTER — Other Ambulatory Visit: Payer: Self-pay | Admitting: Infectious Disease

## 2017-05-04 ENCOUNTER — Other Ambulatory Visit: Payer: Medicare Other

## 2017-05-04 DIAGNOSIS — B20 Human immunodeficiency virus [HIV] disease: Secondary | ICD-10-CM | POA: Diagnosis not present

## 2017-05-04 MED FILL — BIKTARVY 50-200-25 MG TABS: 50-200-25 | 30 days supply | Qty: 30 | Fill #1

## 2017-05-05 LAB — T-HELPER CELL (CD4) - (RCID CLINIC ONLY)
CD4 T CELL HELPER: 23 % — AB (ref 33–55)
CD4 T Cell Abs: 360 /uL — ABNORMAL LOW (ref 400–2700)

## 2017-05-06 LAB — HIV-1 RNA QUANT-NO REFLEX-BLD
HIV 1 RNA Quant: <20 copies/mL
HIV-1 RNA QUANT, LOG: NOT DETECTED {Log_copies}/mL

## 2017-05-08 ENCOUNTER — Inpatient Hospital Stay: Payer: Medicare Other

## 2017-05-08 ENCOUNTER — Inpatient Hospital Stay: Payer: Medicare Other | Attending: Oncology | Admitting: Oncology

## 2017-05-08 ENCOUNTER — Encounter: Payer: Self-pay | Admitting: Oncology

## 2017-05-08 VITALS — BP 128/71 | HR 87 | Temp 98.9°F | Resp 17 | Ht 71.0 in | Wt 204.3 lb

## 2017-05-08 DIAGNOSIS — E291 Testicular hypofunction: Secondary | ICD-10-CM | POA: Insufficient documentation

## 2017-05-08 DIAGNOSIS — Z5189 Encounter for other specified aftercare: Secondary | ICD-10-CM | POA: Diagnosis not present

## 2017-05-08 DIAGNOSIS — Z5111 Encounter for antineoplastic chemotherapy: Secondary | ICD-10-CM | POA: Diagnosis not present

## 2017-05-08 DIAGNOSIS — C61 Malignant neoplasm of prostate: Secondary | ICD-10-CM | POA: Diagnosis not present

## 2017-05-08 DIAGNOSIS — Z7189 Other specified counseling: Secondary | ICD-10-CM

## 2017-05-08 DIAGNOSIS — Z95828 Presence of other vascular implants and grafts: Secondary | ICD-10-CM

## 2017-05-08 DIAGNOSIS — G893 Neoplasm related pain (acute) (chronic): Secondary | ICD-10-CM | POA: Diagnosis not present

## 2017-05-08 DIAGNOSIS — B20 Human immunodeficiency virus [HIV] disease: Secondary | ICD-10-CM | POA: Diagnosis not present

## 2017-05-08 DIAGNOSIS — Z923 Personal history of irradiation: Secondary | ICD-10-CM | POA: Insufficient documentation

## 2017-05-08 DIAGNOSIS — C7951 Secondary malignant neoplasm of bone: Secondary | ICD-10-CM

## 2017-05-08 DIAGNOSIS — D701 Agranulocytosis secondary to cancer chemotherapy: Secondary | ICD-10-CM | POA: Diagnosis not present

## 2017-05-08 LAB — CBC WITH DIFFERENTIAL/PLATELET
BASOS ABS: 0 10*3/uL (ref 0.0–0.1)
BASOS PCT: 1 %
EOS ABS: 0.1 10*3/uL (ref 0.0–0.5)
EOS PCT: 1 %
HCT: 33.9 % — ABNORMAL LOW (ref 38.4–49.9)
Hemoglobin: 10.9 g/dL — ABNORMAL LOW (ref 13.0–17.1)
LYMPHS PCT: 20 %
Lymphs Abs: 1.1 10*3/uL (ref 0.9–3.3)
MCH: 29.3 pg (ref 27.2–33.4)
MCHC: 32.3 g/dL (ref 32.0–36.0)
MCV: 90.7 fL (ref 79.3–98.0)
MONO ABS: 0.6 10*3/uL (ref 0.1–0.9)
Monocytes Relative: 11 %
Neutro Abs: 3.8 10*3/uL (ref 1.5–6.5)
Neutrophils Relative %: 67 %
PLATELETS: 275 10*3/uL (ref 140–400)
RBC: 3.74 MIL/uL — AB (ref 4.20–5.82)
RDW: 18.5 % — AB (ref 11.0–14.6)
WBC: 5.6 10*3/uL (ref 4.0–10.3)

## 2017-05-08 LAB — COMPREHENSIVE METABOLIC PANEL
ALBUMIN: 3.7 g/dL (ref 3.5–5.0)
ALK PHOS: 387 U/L — AB (ref 40–150)
ALT: 10 U/L (ref 0–55)
AST: 12 U/L (ref 5–34)
Anion gap: 8 (ref 3–11)
BILIRUBIN TOTAL: 0.5 mg/dL (ref 0.2–1.2)
BUN: 8 mg/dL (ref 7–26)
CALCIUM: 9.6 mg/dL (ref 8.4–10.4)
CO2: 29 mmol/L (ref 22–29)
Chloride: 103 mmol/L (ref 98–109)
Creatinine, Ser: 0.7 mg/dL (ref 0.70–1.30)
GFR calc Af Amer: >60 mL/min (ref >60)
GLUCOSE: 97 mg/dL (ref 70–140)
POTASSIUM: 3.6 mmol/L (ref 3.5–5.1)
Sodium: 140 mmol/L (ref 136–145)
TOTAL PROTEIN: 8 g/dL (ref 6.4–8.3)

## 2017-05-08 MED ORDER — DEXAMETHASONE SODIUM PHOSPHATE 10 MG/ML IJ SOLN
10.0000 mg | Freq: Once | INTRAMUSCULAR | Status: AC
  Administered 2017-05-08: 10 mg via INTRAVENOUS

## 2017-05-08 MED ORDER — SODIUM CHLORIDE 0.9 % IV SOLN
60.0000 mg/m2 | Freq: Once | INTRAVENOUS | Status: AC
  Administered 2017-05-08: 130 mg via INTRAVENOUS
  Filled 2017-05-08: qty 13

## 2017-05-08 MED ORDER — SODIUM CHLORIDE 0.9% FLUSH
10.0000 mL | INTRAVENOUS | Status: DC | PRN
  Administered 2017-05-08: 10 mL via INTRAVENOUS
  Filled 2017-05-08: qty 10

## 2017-05-08 MED ORDER — HEPARIN SOD (PORK) LOCK FLUSH 100 UNIT/ML IV SOLN
500.0000 [IU] | Freq: Once | INTRAVENOUS | Status: AC | PRN
  Administered 2017-05-08: 500 [IU]
  Filled 2017-05-08: qty 5

## 2017-05-08 MED ORDER — DEXAMETHASONE SODIUM PHOSPHATE 10 MG/ML IJ SOLN
INTRAMUSCULAR | Status: AC
  Filled 2017-05-08: qty 1

## 2017-05-08 MED ORDER — SODIUM CHLORIDE 0.9% FLUSH
10.0000 mL | INTRAVENOUS | Status: DC | PRN
  Administered 2017-05-08: 10 mL
  Filled 2017-05-08: qty 10

## 2017-05-08 MED ORDER — OXYCODONE HCL 5 MG PO TABS: 5.0000 mg | ORAL_TABLET | ORAL | 0 refills | Status: DC | PRN

## 2017-05-08 MED ORDER — PEGFILGRASTIM 6 MG/0.6ML ~~LOC~~ PSKT
PREFILLED_SYRINGE | SUBCUTANEOUS | Status: AC
  Filled 2017-05-08: qty 0.6

## 2017-05-08 MED ORDER — SODIUM CHLORIDE 0.9 % IV SOLN
Freq: Once | INTRAVENOUS | Status: AC
  Administered 2017-05-08: 14:00:00 via INTRAVENOUS

## 2017-05-08 MED ORDER — PEGFILGRASTIM 6 MG/0.6ML ~~LOC~~ PSKT
6.0000 mg | PREFILLED_SYRINGE | Freq: Once | SUBCUTANEOUS | Status: AC
  Administered 2017-05-08: 6 mg via SUBCUTANEOUS

## 2017-05-08 NOTE — Patient Instructions (Signed)
[facility] Discharge Instructions for Patients Receiving Chemotherapy  Today you received the following chemotherapy agents:  Taxotere (docetaxel)  To help prevent nausea and vomiting after your treatment, we encourage you to take your nausea medication as prescribed.   If you develop nausea and vomiting that is not controlled by your nausea medication, call the clinic.   BELOW ARE SYMPTOMS THAT SHOULD BE REPORTED IMMEDIATELY:  *FEVER GREATER THAN 100.5 F  *CHILLS WITH OR WITHOUT FEVER  NAUSEA AND VOMITING THAT IS NOT CONTROLLED WITH YOUR NAUSEA MEDICATION  *UNUSUAL SHORTNESS OF BREATH  *UNUSUAL BRUISING OR BLEEDING  TENDERNESS IN MOUTH AND THROAT WITH OR WITHOUT PRESENCE OF ULCERS  *URINARY PROBLEMS  *BOWEL PROBLEMS  UNUSUAL RASH Items with * indicate a potential emergency and should be followed up as soon as possible.  Feel free to call the clinic should you have any questions or concerns. The clinic phone number is [phone].  Please show the [place] at check-in to the Emergency Department and triage nurse.

## 2017-05-08 NOTE — Patient Instructions (Signed)

## 2017-05-08 NOTE — Progress Notes (Signed)
[facility] OFFICE PROGRESS NOTE  Dean Carlson, [name], MD 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  His initial diagnosis was in 2014 with Gleason score 4+5 = 9 and PSA at the time of diagnosis 230.   PRIOR THERAPY:  He was treated with Lupron therapy at the time of diagnosis and had an excellent response with PSA down to 0.97 back in August 2015.   He developed castration resistant disease and PSA did rise to 18.76 in April 2016. [name] and discontinued because of poor tolerance.     Zytiga for a total dose of 1000 mg daily started on 02/15/2015. His dose was changed to 500 mg starting on 08/30/2015. Therapy discontinued in January 2018 because of progression of disease.  Palliative radiation therapy to the right hip under the care of Dr. Tammi Klippel. Therapy concluded in February 2018 after receiving total of 30 gray to the right hip.  Xtandi 160 mg daily started in January 2018.  Therapy discontinued in November 2018 because of progression of disease.  CURRENT THERAPY: Taxotere chemotherapy 60 mg/m2 every 3 weeks started on 02/06/2017.  He is here for cycle 5 of therapy.  INTERVAL HISTORY: Dean Carlson 57 y.o. male returns for routine follow-up visit by himself.  The patient reports that he has been having increased pain to his right forearm and to his right hip.  The right forearm hurts when he attempts to twist his wrist.  He has some difficulty moving his right leg when he is laying down.  He does not have any pain when he is sitting, but the pain worsens when he tries to ambulate.  He has not had any falls.  He has been using Percocet about 4 times a day with some relief.  Reports fatigue.  He denies fevers and chills.  Denies chest pain, shortness of breath, cough, hemoptysis.  Denies nausea, vomiting, constipation, diarrhea.    The patient's appetite is fair and he has lost a few pounds since last visit.  He denied any recent hospitalizations or illnesses.  Denies headaches, blurred vision, syncope, seizures.  Denies hematochezia and melena.  Denies urinary frequency, hematuria, hesitancy.  Denies skin rashes.  The remaining review of systems is negative.  Patient is here for evaluation prior to cycle 5 of his chemotherapy.  MEDICAL HISTORY: Past Medical History:  Diagnosis Date  . Adjustment disorder with mixed anxiety and depressed mood 05/09/2015  . Asthma    as a child  . Dizziness 05/09/2015  . Grief 05/09/2015  . HIV (human immunodeficiency virus infection) ([place])   . Insomnia 05/09/2015  . Memory deficit 09/05/2016  . Nausea without vomiting 10/20/2016  . Prostate cancer ([facility])   . Weight gain 05/09/2015    ALLERGIES:  is allergic to alka-seltzer [aspirin effervescent] and excedrin extra strength [aspirin-acetaminophen-caffeine].  MEDICATIONS:  Current Outpatient Medications  Medication Sig Dispense Refill  . bictegravir-emtricitabine-tenofovir AF (BIKTARVY) 50-200-25 MG TABS tablet Take 1 tablet by mouth daily. 30 tablet 6  . CALCIUM PO Take 1 tablet by mouth daily.    . cyclobenzaprine (FLEXERIL) 10 MG tablet Take 1 tablet (10 mg total) by mouth 2 (two) times daily as needed for muscle spasms. 30 tablet 0  . leuprolide (LUPRON) 30 MG injection Inject 30 mg into the muscle every 4 (four) months.    . lidocaine-prilocaine (EMLA) cream Apply a dime  size to port-a-cath 1-2 hours prior to access. Do not rub in. Cover with [employer]. 30 g 1  . prochlorperazine (COMPAZINE) 10 MG tablet Take 1 tablet (10 mg total) by mouth every 6 (six) hours as needed for nausea or vomiting. 30 tablet 0  . oxyCODONE (OXY IR/ROXICODONE) 5 MG immediate release tablet Take 1-2 tablets (5-10 mg total) by mouth every 4 (four) hours as needed for severe pain. 60 tablet 0   No current facility-administered medications for this visit.     Facility-Administered Medications Ordered in Other Visits  Medication Dose Route Frequency Provider Last Rate Last Dose  . DOCEtaxel (TAXOTERE) 130 mg in sodium chloride 0.9 % 250 mL chemo infusion  60 mg/m2 (Treatment Plan Recorded) Intravenous Once Wyatt Portela, MD      . heparin lock flush 100 unit/mL  500 Units Intracatheter Once PRN Wyatt Portela, MD      . pegfilgrastim (NEULASTA ONPRO KIT) injection 6 mg  6 mg Subcutaneous Once Shadad, Firas N, MD      . sodium chloride flush (NS) 0.9 % injection 10 mL  10 mL Intracatheter PRN Wyatt Portela, MD        SURGICAL HISTORY:  Past Surgical History:  Procedure Laterality Date  . COLONOSCOPY  05/13/2011   Procedure: COLONOSCOPY;  Surgeon: Jerene Bears, MD;  Location: [facility];  Service: Gastroenterology;  Laterality: N/A;  . IR FLUORO GUIDE PORT INSERTION RIGHT  01/21/2017  . IR US GUIDE VASC ACCESS RIGHT  01/21/2017  . PROSTATE BIOPSY  08/16/2012    REVIEW OF SYSTEMS:   Review of Systems  Constitutional: Negative for chills, fever. Positive for fatigue and weight loss. HENT:   Negative for mouth sores, nosebleeds, sore throat and trouble swallowing.   Eyes: Negative for eye problems and icterus.  Respiratory: Negative for cough, hemoptysis, shortness of breath and wheezing.   Cardiovascular: Negative for chest pain and leg swelling.  Gastrointestinal: Negative for abdominal pain, constipation, diarrhea, nausea and vomiting.  Genitourinary: Negative for bladder incontinence, difficulty urinating, dysuria, frequency and hematuria.   Musculoskeletal: Negative for back pain, neck pain and neck stiffness. Positive for right hip pain and right forearm pain. Skin: Negative for itching and rash.  Neurological: Negative for dizziness, extremity weakness, gait problem, headaches, light-headedness and seizures. Reports mild neuropathy to his feet.  Unchanged from prior to starting chemotherapy. Hematological: Negative for adenopathy.  Does not bruise/bleed easily.  Psychiatric/Behavioral: Negative for confusion, depression and sleep disturbance. The patient is not nervous/anxious.     PHYSICAL EXAMINATION:  Blood pressure 128/71, pulse 87, temperature 98.9 F (37.2 C), temperature source Oral, resp. rate 17, height '5\' 11"'  (1.803 m), weight 204 lb 4.8 oz (92.7 kg), SpO2 99 %.  ECOG PERFORMANCE STATUS: 1 - Symptomatic but completely ambulatory  Physical Exam  Constitutional: Oriented to person, place, and time and well-developed, well-nourished, and in no distress. No distress.  HENT:  Head: Normocephalic and atraumatic.  Mouth/Throat: Oropharynx is clear and moist. No oropharyngeal exudate.  Eyes: Conjunctivae are normal. Right eye exhibits no discharge. Left eye exhibits no discharge. No scleral icterus.  Neck: Normal range of motion. Neck supple.  Cardiovascular: Normal rate, regular rhythm, normal heart sounds and intact distal pulses.   Pulmonary/Chest: Effort normal and breath sounds normal. No respiratory distress. No wheezes. No rales.  Abdominal: Soft. Bowel sounds are normal. Exhibits no distension and no mass. There is no tenderness.  Musculoskeletal: Exhibits no edema. Pain with palpation to the  right forearm.  The patient has difficulty moving his right leg when laying down.  He has no difficulty with his gait.  No pain to the spine with palpation. Lymphadenopathy:    No cervical adenopathy.  Neurological: Alert and oriented to person, place, and time. Exhibits normal muscle tone. Coordination normal.  Skin: Skin is warm and dry. No rash noted. Not diaphoretic. No erythema. No pallor.  Psychiatric: Mood, memory and judgment normal.  Vitals reviewed.  LABORATORY DATA: Lab Results  Component Value Date   WBC 5.6 05/08/2017   HGB 10.9 (L) 05/08/2017   HCT 33.9 (L) 05/08/2017   MCV 90.7 05/08/2017   PLT 275 05/08/2017      Chemistry      Component Value Date/Time   NA 140 05/08/2017 1205   NA 140  02/27/2017 1104   K 3.6 05/08/2017 1205   K 3.4 (L) 02/27/2017 1104   CL 103 05/08/2017 1205   CO2 29 05/08/2017 1205   CO2 28 02/27/2017 1104   BUN 8 05/08/2017 1205   BUN 9.1 02/27/2017 1104   CREATININE 0.70 05/08/2017 1205   CREATININE 0.8 02/27/2017 1104      Component Value Date/Time   CALCIUM 9.6 05/08/2017 1205   CALCIUM 8.8 02/27/2017 1104   ALKPHOS 387 (H) 05/08/2017 1205   ALKPHOS 497 (H) 02/27/2017 1104   AST 12 05/08/2017 1205   AST 13 02/27/2017 1104   ALT 10 05/08/2017 1205   ALT 12 02/27/2017 1104   BILITOT 0.5 05/08/2017 1205   BILITOT 0.28 02/27/2017 1104     Results for SLY, PARLEE (MRN [id number]) as of 05/08/2017 11:54  Ref. Range 10/10/2016 11:02 02/06/2017 11:47 02/27/2017 11:04 03/20/2017 11:44 04/10/2017 11:26  Prostate Specific Ag, Serum Latest Ref Range: 0.0 - 4.0 ng/mL 81.9 (H) 211.8 (H) 199.5 (H) 185.8 (H) 198.0 (H)    RADIOGRAPHIC STUDIES:  No results found.   ASSESSMENT/PLAN:  57 year old gentleman with the following issues:  1. Castration resistant metastatic prostate cancer with predominant bone disease that is symptomatic.  He has received multiple therapy outlined above and has progressed on hormone therapy and second line hormone manipulation.  He is currently receiving Taxotere chemotherapy with excellent response so far.    PSA declined to 185.8, but was up slightly 3 weeks ago at 198.0.  PSA from today is pending.   Alkaline phosphatase is declining.  He is overall had a positive clinical response and recommend that he continue on Taxotere.  He will proceed with cycle 5 as scheduled today.  The rationale for continuing chemotherapy as well as risks and benefits were reviewed today.  Given his excellent benefit at this time and reasonable response I would recommend continuing this treatment to at least 10 cycles if he can tolerate it.  He is agreeable to continue at this time.  2. Androgen depravation: Testosterone level on 04/10/2017 was  low at 23.  Will consider reintroducing Lupron if his testosterone level is not castrate the future.  3. HIV: No changes in his medication noted at this time.  He continues to be followed by Dr. Tommy Carlson.    4.  Neutropenia prophylaxis: He is at risk of developing neutropenic fever and sepsis related to chemotherapy.  He will receive Neulasta Onpro after each treatment.    5. Right hip pain and right forearm pain: Related to prostate cancer metastasis.   Pain has overall improved since beginning chemotherapy, but he is having some increased pain in the right forearm.  Prior bone scan does show some increased tracer accumulation in the right forearm likely due to bone metastases.  Pain was discussed with Dr Alen Blew.  The patient was offered a referral to radiation oncology for consideration of radiation to his right forearm which he would like to pursue.  A referral has been ordered today.  Will adjust his pain medications to oxycodone 5-10 mg every 4 hours as needed for severe pain.  He was advised to keep track of how much pain medication he is taking and we will adjust this as needed in the future.    6.  IV access: Port-A-Cath remained in place without complications since last visit.    7.  Goals of care: He has an incurable malignancy but a disease that can be palliated adequately with aggressive therapy.  His performance status is adequate and aggressive therapy continues to be warranted.    8.  Antiemetics: No nausea or vomiting reported since last visit.  Prescription for Compazine is available to him to use as needed.  9. Follow-up: Will be in 3 for the next cycle of chemotherapy.  Orders Placed This Encounter  Procedures  . Ambulatory referral to Radiation Oncology    Referral Priority:   Routine    Referral Type:   Consultation    Referral Reason:   Specialty Services Required    Referred to Provider:   Tyler Pita, MD    Requested Specialty:   Radiation Oncology    Number  of Visits Requested:   [place], DNP, AGPCNP-BC, AOCNP 05/08/17

## 2017-05-09 LAB — PROSTATE-SPECIFIC AG, SERUM (LABCORP): PROSTATE SPECIFIC AG, SERUM: 242.1 ng/mL — AB (ref 0.0–4.0)

## 2017-05-11 ENCOUNTER — Encounter: Payer: Self-pay | Admitting: Radiation Oncology

## 2017-05-12 ENCOUNTER — Telehealth: Payer: Self-pay | Admitting: Oncology

## 2017-05-12 DIAGNOSIS — Z5111 Encounter for antineoplastic chemotherapy: Secondary | ICD-10-CM | POA: Diagnosis not present

## 2017-05-12 DIAGNOSIS — C61 Malignant neoplasm of prostate: Secondary | ICD-10-CM | POA: Diagnosis not present

## 2017-05-12 NOTE — Telephone Encounter (Signed)
appts already scheduled per  3/8 los - no additional appts added.

## 2017-05-14 ENCOUNTER — Ambulatory Visit: Payer: Medicare Other | Admitting: Urology

## 2017-05-14 ENCOUNTER — Ambulatory Visit: Payer: Medicare Other

## 2017-05-18 ENCOUNTER — Encounter: Payer: Self-pay | Admitting: Infectious Disease

## 2017-05-18 ENCOUNTER — Ambulatory Visit (INDEPENDENT_AMBULATORY_CARE_PROVIDER_SITE_OTHER): Payer: Medicare Other | Admitting: Infectious Disease

## 2017-05-18 VITALS — BP 133/77 | HR 87 | Temp 99.0°F | Wt 204.0 lb

## 2017-05-18 DIAGNOSIS — B20 Human immunodeficiency virus [HIV] disease: Secondary | ICD-10-CM

## 2017-05-18 DIAGNOSIS — M79601 Pain in right arm: Secondary | ICD-10-CM

## 2017-05-18 DIAGNOSIS — F4321 Adjustment disorder with depressed mood: Secondary | ICD-10-CM | POA: Diagnosis not present

## 2017-05-18 DIAGNOSIS — C61 Malignant neoplasm of prostate: Secondary | ICD-10-CM | POA: Diagnosis not present

## 2017-05-18 DIAGNOSIS — C7951 Secondary malignant neoplasm of bone: Secondary | ICD-10-CM

## 2017-05-18 NOTE — Progress Notes (Signed)
Chief complaint: arm pain   Subjective:    Patient ID: Dean Carlson, male    DOB: [birth year], 57 y.o.   MRN: [id number]  HPI  Dean Carlson is 57 year old man with HIV, prostate cancer that was diagnosed while he was on testosterone. He has been followed closely by urology and now by Oncology with Dr. Barbaraann Faster   PSA is rising again and PET scan shows multiple lytic lesions in bone that have progressed.   He  On palliative chemotherapy with  Taxotere with Dr. Barbaraann Faster and has had improvement in his symptoms of bone pain with reduction in size of his tumor burden  We have changed his regimen once again now back to [facility].  We have also initiated trazodone for insomnia.  We will plan on rechecking his labs in 1 month's time after he has been on  BIKTARVY for at least a month.  He is also on Neulasta to help stimulate his neutrophils had some anxiety that may be his CD4 cell might be dropping to in the context of his chemotherapy   Is virus is <20 and CD4 in 300s despite being on chemotherapy.   Lab Results  Component Value Date   HIV1RNAQUANT <20 NOT DETECTED 05/04/2017   HIV1RNAQUANT <20 NOT DETECTED 01/19/2017   HIV1RNAQUANT <20 NOT DETECTED 10/06/2016    Lab Results  Component Value Date   CD4TABS 360 (L) 05/04/2017   CD4TABS 450 01/19/2017   CD4TABS 490 10/06/2016   He does have right wrist pain where he has a metastasis.  Past Medical History:  Diagnosis Date  . Adjustment disorder with mixed anxiety and depressed mood 05/09/2015  . Asthma    as a child  . Dizziness 05/09/2015  . Grief 05/09/2015  . HIV (human immunodeficiency virus infection) ([place])   . Insomnia 05/09/2015  . Memory deficit 09/05/2016  . Nausea without vomiting 10/20/2016  . Prostate cancer ([place])   . Weight gain 05/09/2015    Past Surgical History:  Procedure Laterality Date  . COLONOSCOPY  05/13/2011   Procedure: COLONOSCOPY;  Surgeon: Jerene Bears, MD;  Location: [facility];  Service: Gastroenterology;   Laterality: N/A;  . IR FLUORO GUIDE PORT INSERTION RIGHT  01/21/2017  . IR US GUIDE VASC ACCESS RIGHT  01/21/2017  . PROSTATE BIOPSY  08/16/2012    Family History  Problem Relation Age of Onset  . Diabetes Unknown   . Cancer Neg Hx       Social History   Socioeconomic History  . Marital status: Legally Separated    Spouse name: Not on file  . Number of children: Not on file  . Years of education: Not on file  . Highest education level: Not on file  Social Needs  . Financial resource strain: Not on file  . Food insecurity - worry: Not on file  . Food insecurity - inability: Not on file  . Transportation needs - medical: Not on file  . Transportation needs - non-medical: Not on file  Occupational History  . Occupation: permanant disability  Tobacco Use  . Smoking status: Former Smoker    Packs/day: 1.00    Types: Cigarettes    Last attempt to quit: 03/15/2011    Years since quitting: 6.1  . Smokeless tobacco: Never Used  Substance and Sexual Activity  . Alcohol use: No    Alcohol/week: 0.0 oz    Comment: occ  . Drug use: No  . Sexual activity: Not Currently  Other Topics Concern  .  Not on file  Social History Narrative  . Not on file    Allergies  Allergen Reactions  . Alka-Seltzer [Aspirin Effervescent] Other (See Comments)    asthma  . Excedrin Extra Strength [Aspirin-Acetaminophen-Caffeine] Other (See Comments)    REACTION: asthma     Current Outpatient Medications:  .  bictegravir-emtricitabine-tenofovir AF (BIKTARVY) 50-200-25 MG TABS tablet, Take 1 tablet by mouth daily., Disp: 30 tablet, Rfl: 6 .  CALCIUM PO, Take 1 tablet by mouth daily., Disp: , Rfl:  .  cyclobenzaprine (FLEXERIL) 10 MG tablet, Take 1 tablet (10 mg total) by mouth 2 (two) times daily as needed for muscle spasms., Disp: 30 tablet, Rfl: 0 .  leuprolide (LUPRON) 30 MG injection, Inject 30 mg into the muscle every 4 (four) months., Disp: , Rfl:  .  lidocaine-prilocaine (EMLA) cream,  Apply a dime size to port-a-cath 1-2 hours prior to access. Do not rub in. Cover with [employer]., Disp: 30 g, Rfl: 1 .  oxyCODONE (OXY IR/ROXICODONE) 5 MG immediate release tablet, Take 1-2 tablets (5-10 mg total) by mouth every 4 (four) hours as needed for severe pain., Disp: 60 tablet, Rfl: 0 .  prochlorperazine (COMPAZINE) 10 MG tablet, Take 1 tablet (10 mg total) by mouth every 6 (six) hours as needed for nausea or vomiting., Disp: 30 tablet, Rfl: 0    Review of Systems  Constitutional: Positive for fatigue. Negative for activity change, appetite change, chills, diaphoresis, fever and unexpected weight change.  HENT: Negative for congestion, rhinorrhea, sinus pressure, sneezing, sore throat and trouble swallowing.   Eyes: Negative for photophobia and visual disturbance.  Respiratory: Negative for cough, chest tightness, shortness of breath, wheezing and stridor.   Cardiovascular: Negative for chest pain, palpitations and leg swelling.  Gastrointestinal: Negative for abdominal distention, abdominal pain, anal bleeding, blood in stool, constipation, diarrhea, nausea and vomiting.  Genitourinary: Negative for difficulty urinating, dysuria, flank pain and hematuria.  Musculoskeletal: Positive for arthralgias, back pain, myalgias and neck stiffness. Negative for gait problem and joint swelling.  Skin: Negative for color change, pallor, rash and wound.  Neurological: Negative for dizziness, tremors, weakness and light-headedness.  Hematological: Negative for adenopathy. Does not bruise/bleed easily.  Psychiatric/Behavioral: Negative for agitation, behavioral problems, confusion, self-injury and sleep disturbance. The patient is not nervous/anxious.        Objective:   Physical Exam  Constitutional: He is oriented to person, place, and time. He appears well-developed and well-nourished.  HENT:  Head: Normocephalic and atraumatic.  Eyes: Conjunctivae and EOM are normal.  Neck: Normal range  of motion. Neck supple.  Cardiovascular: Normal rate and regular rhythm.  Pulmonary/Chest: Effort normal. No respiratory distress. He has no wheezes.  Abdominal: Soft. He exhibits no distension.  Musculoskeletal: Normal range of motion. He exhibits no edema or tenderness.       Arms: Neurological: He is alert and oriented to person, place, and time.  Skin: Skin is warm and dry. No rash noted. No erythema. No pallor.  Psychiatric: He has a normal mood and affect. His speech is normal and behavior is normal. Judgment and thought content normal. Cognition and memory are normal.    He is losing hair from his eyebrows and his facial hair      Assessment & Plan:   HIV: continue BIKTARVY, RTC in 3 months and check labs again then  Prostate cancer: Greatly appreciate the comprehensive care  being provided by Dr. Barbaraann Faster was following him closely.  Grief and depression: continue supportive care.  Painful bony mets: on opiates to help with this, pain is improved with chemotherapy. He is go get XRT to arm  I spent greater than 25 minutes with the patient including greater than 50% of time in face to face counsel of the [place] discussing his spiritual journey, his relationship with "the love of his life" whom he feels he drew pictures of when he was  Child, his relationship with God, his acceptance of his mortality his thoughts about when he started on testosterone therapy and what then manifested itself (his prostate cancer ultimately that fluorished on androgens ( and in coordination of his  care.

## 2017-05-29 ENCOUNTER — Inpatient Hospital Stay: Payer: Medicare Other

## 2017-05-29 ENCOUNTER — Inpatient Hospital Stay (HOSPITAL_BASED_OUTPATIENT_CLINIC_OR_DEPARTMENT_OTHER): Payer: Medicare Other | Admitting: Oncology

## 2017-05-29 VITALS — BP 124/79 | HR 94 | Temp 98.7°F | Resp 18 | Ht 71.0 in | Wt 199.1 lb

## 2017-05-29 DIAGNOSIS — Z5111 Encounter for antineoplastic chemotherapy: Secondary | ICD-10-CM | POA: Diagnosis not present

## 2017-05-29 DIAGNOSIS — M25551 Pain in right hip: Secondary | ICD-10-CM | POA: Diagnosis not present

## 2017-05-29 DIAGNOSIS — E291 Testicular hypofunction: Secondary | ICD-10-CM | POA: Diagnosis not present

## 2017-05-29 DIAGNOSIS — Z7189 Other specified counseling: Secondary | ICD-10-CM

## 2017-05-29 DIAGNOSIS — B20 Human immunodeficiency virus [HIV] disease: Secondary | ICD-10-CM | POA: Diagnosis not present

## 2017-05-29 DIAGNOSIS — Z95828 Presence of other vascular implants and grafts: Secondary | ICD-10-CM

## 2017-05-29 DIAGNOSIS — C7951 Secondary malignant neoplasm of bone: Secondary | ICD-10-CM

## 2017-05-29 DIAGNOSIS — C61 Malignant neoplasm of prostate: Secondary | ICD-10-CM

## 2017-05-29 DIAGNOSIS — C779 Secondary and unspecified malignant neoplasm of lymph node, unspecified: Secondary | ICD-10-CM | POA: Diagnosis not present

## 2017-05-29 DIAGNOSIS — M79601 Pain in right arm: Secondary | ICD-10-CM

## 2017-05-29 DIAGNOSIS — G893 Neoplasm related pain (acute) (chronic): Secondary | ICD-10-CM | POA: Diagnosis not present

## 2017-05-29 LAB — COMPREHENSIVE METABOLIC PANEL
ALBUMIN: 3.7 g/dL (ref 3.5–5.0)
ALT: 11 U/L (ref 0–55)
AST: 15 U/L (ref 5–34)
Alkaline Phosphatase: 370 U/L — ABNORMAL HIGH (ref 40–150)
Anion gap: 9 (ref 3–11)
BILIRUBIN TOTAL: 0.4 mg/dL (ref 0.2–1.2)
BUN: 14 mg/dL (ref 7–26)
CHLORIDE: 102 mmol/L (ref 98–109)
CO2: 27 mmol/L (ref 22–29)
CREATININE: 0.78 mg/dL (ref 0.70–1.30)
Calcium: 9.2 mg/dL (ref 8.4–10.4)
GFR calc Af Amer: >60 mL/min (ref >60)
GFR calc non Af Amer: >60 mL/min (ref >60)
GLUCOSE: 94 mg/dL (ref 70–140)
POTASSIUM: 3.3 mmol/L — AB (ref 3.5–5.1)
Sodium: 138 mmol/L (ref 136–145)
Total Protein: 7.7 g/dL (ref 6.4–8.3)

## 2017-05-29 LAB — CBC WITH DIFFERENTIAL/PLATELET
Basophils Absolute: 0 10*3/uL (ref 0.0–0.1)
Basophils Relative: 0 %
EOS ABS: 0 10*3/uL (ref 0.0–0.5)
EOS PCT: 1 %
HCT: 34.2 % — ABNORMAL LOW (ref 38.4–49.9)
Hemoglobin: 11.1 g/dL — ABNORMAL LOW (ref 13.0–17.1)
LYMPHS ABS: 1.5 10*3/uL (ref 0.9–3.3)
Lymphocytes Relative: 27 %
MCH: 29.5 pg (ref 27.2–33.4)
MCHC: 32.5 g/dL (ref 32.0–36.0)
MCV: 91 fL (ref 79.3–98.0)
MONO ABS: 1 10*3/uL — AB (ref 0.1–0.9)
Monocytes Relative: 18 %
Neutro Abs: 3.1 10*3/uL (ref 1.5–6.5)
Neutrophils Relative %: 54 %
PLATELETS: 287 10*3/uL (ref 140–400)
RBC: 3.76 MIL/uL — ABNORMAL LOW (ref 4.20–5.82)
RDW: 16.8 % — AB (ref 11.0–14.6)
WBC: 5.6 10*3/uL (ref 4.0–10.3)

## 2017-05-29 MED ORDER — SODIUM CHLORIDE 0.9% FLUSH
10.0000 mL | INTRAVENOUS | Status: DC | PRN
  Administered 2017-05-29: 10 mL via INTRAVENOUS
  Filled 2017-05-29: qty 10

## 2017-05-29 MED ORDER — SODIUM CHLORIDE 0.9 % IV SOLN
60.0000 mg/m2 | Freq: Once | INTRAVENOUS | Status: AC
  Administered 2017-05-29: 130 mg via INTRAVENOUS
  Filled 2017-05-29: qty 13

## 2017-05-29 MED ORDER — HEPARIN SOD (PORK) LOCK FLUSH 100 UNIT/ML IV SOLN
500.0000 [IU] | Freq: Once | INTRAVENOUS | Status: AC | PRN
  Administered 2017-05-29: 500 [IU]
  Filled 2017-05-29: qty 5

## 2017-05-29 MED ORDER — DEXAMETHASONE SODIUM PHOSPHATE 10 MG/ML IJ SOLN
INTRAMUSCULAR | Status: AC
  Filled 2017-05-29: qty 1

## 2017-05-29 MED ORDER — DEXAMETHASONE SODIUM PHOSPHATE 10 MG/ML IJ SOLN
10.0000 mg | Freq: Once | INTRAMUSCULAR | Status: AC
  Administered 2017-05-29: 10 mg via INTRAVENOUS

## 2017-05-29 MED ORDER — SODIUM CHLORIDE 0.9% FLUSH
10.0000 mL | INTRAVENOUS | Status: DC | PRN
  Administered 2017-05-29: 10 mL
  Filled 2017-05-29: qty 10

## 2017-05-29 MED ORDER — PEGFILGRASTIM 6 MG/0.6ML ~~LOC~~ PSKT
PREFILLED_SYRINGE | SUBCUTANEOUS | Status: AC
  Filled 2017-05-29: qty 0.6

## 2017-05-29 MED ORDER — PEGFILGRASTIM 6 MG/0.6ML ~~LOC~~ PSKT
6.0000 mg | PREFILLED_SYRINGE | Freq: Once | SUBCUTANEOUS | Status: AC
Start: 2017-05-29 — End: 2017-05-29
  Administered 2017-05-29: 6 mg via SUBCUTANEOUS

## 2017-05-29 MED ORDER — SODIUM CHLORIDE 0.9 % IV SOLN
Freq: Once | INTRAVENOUS | Status: AC
  Administered 2017-05-29: 13:00:00 via INTRAVENOUS

## 2017-05-29 NOTE — Progress Notes (Signed)
Hematology and Oncology Follow Up Visit  Thomson Herbers [id number] 05/26/1960 57 y.o. 05/29/2017 12:53 PM Dean Carlson, [name], MDVan Templeton, [name], MD   Principle Diagnosis: 57 year old man with advanced prostate cancer diagnosed in 2014 with a Gleason score of 4.5 equals 9 and a PSA of 230.  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  Prior Therapy: He was treated with Lupron therapy at the time of diagnosis and had an excellent response with PSA down to 0.97 back in August 2015.   He developed castration resistant disease and PSA did rise to 18.76 in April 2016. [name] and discontinued because of poor tolerance.     Zytiga for a total dose of 1000 mg daily started on 02/15/2015. His dose was changed to 500 mg starting on 08/30/2015. Therapy discontinued in January 2018 because of progression of disease.  Palliative radiation therapy to the right hip under the care of Dr. Tammi Klippel. Therapy concluded in February 2018 after receiving total of 30 gray to the right hip.  Xtandi 160 mg daily started in January 2018.  Therapy discontinued in November 2018 because of progression of disease.  Current therapy:  Taxotere chemotherapy 60 mg/m2 every 3 weeks started on 02/06/2017.  He is here for cycle 6 of therapy.  Interim History: Mr. Quezada is here for a follow-up visit.  He reports no major changes in his health since the last visit.  He tolerated the last chemotherapy cycle without any new complications.  He does report grade 1 fatigue and anorexia but no nausea, vomiting or worsening neuropathy.  He continues to ambulate with the help of a cane without any falls or syncope.  His right-sided hip pain as not changed dramatically.  He does have right arm pain which is scheduled to have radiation in the near future.  He does use oxycodone as needed which have helped his pain at this time.  He does not report any headaches, blurry vision, syncope or  seizures. Does not report any fevers, chills, sweats. Does not report any chest pain, palpitation orthopnea or leg edema. Does not report any cough, hemoptysis or hemaIs not report any heat or cold intolerancetemesis.  Does not report any hematochezia or melena.  He does report occasional constipation.  He does not report any frequency urgency or hesitancy.  He denies any hematuria.  He does not report any skin rashes or lesions.  He does not report any lymphadenopathy or petechiae.  He does not report any easy bruising.  He denies any anxiety or depression.  Remaining review of systems is negative.  Medications: No changes in his medication are unchanged by my review. Current Outpatient Medications  Medication Sig Dispense Refill  . bictegravir-emtricitabine-tenofovir AF (BIKTARVY) 50-200-25 MG TABS tablet Take 1 tablet by mouth daily. 30 tablet 6  . CALCIUM PO Take 1 tablet by mouth daily.    . cyclobenzaprine (FLEXERIL) 10 MG tablet Take 1 tablet (10 mg total) by mouth 2 (two) times daily as needed for muscle spasms. 30 tablet 0  . leuprolide (LUPRON) 30 MG injection Inject 30 mg into the muscle every 4 (four) months.    . lidocaine-prilocaine (EMLA) cream Apply a dime size to port-a-cath 1-2 hours prior to access. Do not rub in. Cover with [employer]. (Patient not taking: Reported on 05/18/2017) 30 g 1  . oxyCODONE (OXY IR/ROXICODONE) 5 MG immediate release tablet Take 1-2 tablets (5-10 mg total) by mouth every 4 (four)  hours as needed for severe pain. 60 tablet 0  . prochlorperazine (COMPAZINE) 10 MG tablet Take 1 tablet (10 mg total) by mouth every 6 (six) hours as needed for nausea or vomiting. (Patient not taking: Reported on 05/18/2017) 30 tablet 0   No current facility-administered medications for this visit.    Facility-Administered Medications Ordered in Other Visits  Medication Dose Route Frequency Provider Last Rate Last Dose  . sodium chloride flush (NS) 0.9 % injection 10 mL  10 mL  Intravenous PRN Wyatt Portela, MD   10 mL at 05/29/17 1244     Allergies:  Allergies  Allergen Reactions  . Alka-Seltzer [Aspirin Effervescent] Other (See Comments)    asthma  . Excedrin Extra Strength [Aspirin-Acetaminophen-Caffeine] Other (See Comments)    REACTION: asthma    Past Medical History, Surgical history, Social history, and Family History updated without change today.   Physical Exam: Blood pressure 124/79, pulse 94, temperature 98.7 F (37.1 C), temperature source Oral, resp. rate 18, height 5\' 11"  (1.803 m), weight 199 lb 1.6 oz (90.3 kg), SpO2 100 %.   ECOG: 1 General appearance: Alert, awake gentleman appeared in mild distress. Head: Atraumatic without abnormalities. Oral mucosa: No oral lesions or thrush. Eyes:   Sclera anicteric. Lymph nodes: No lymphadenopathy palpated the cervical, axillary or supraclavicular regions. Heart: Regular rate and rhythm without any murmurs or gallops. Lung: clear without any rhonchi or wheezes or dullness to percussion. Abdomin: Soft, nondistended with good bowel sounds.  No rebound or guarding. Musculoskeletal: No clubbing or cyanosis.  No joint deformity or effusion.  Limited range of motion in his right hip. Skin: No skin rashes or lesions.  No ecchymosis. Neurological: No neurological deficits motor, sensory and deep tendon reflexes are intact. Mood: Mood appeared to be appropriate.  Lab Results: Lab Results  Component Value Date   WBC 5.6 05/08/2017   HGB 10.9 (L) 05/08/2017   HCT 33.9 (L) 05/08/2017   MCV 90.7 05/08/2017   PLT 275 05/08/2017     Chemistry      Component Value Date/Time   NA 140 05/08/2017 1205   NA 140 02/27/2017 1104   K 3.6 05/08/2017 1205   K 3.4 (L) 02/27/2017 1104   CL 103 05/08/2017 1205   CO2 29 05/08/2017 1205   CO2 28 02/27/2017 1104   BUN 8 05/08/2017 1205   BUN 9.1 02/27/2017 1104   CREATININE 0.70 05/08/2017 1205   CREATININE 0.8 02/27/2017 1104      Component Value  Date/Time   CALCIUM 9.6 05/08/2017 1205   CALCIUM 8.8 02/27/2017 1104   ALKPHOS 387 (H) 05/08/2017 1205   ALKPHOS 497 (H) 02/27/2017 1104   AST 12 05/08/2017 1205   AST 13 02/27/2017 1104   ALT 10 05/08/2017 1205   ALT 12 02/27/2017 1104   BILITOT 0.5 05/08/2017 1205   BILITOT 0.28 02/27/2017 1104      Results for ALEXZAVIER, GIRARDIN (MRN [id number]) as of 05/29/2017 12:54  Ref. Range 04/10/2017 11:26 05/08/2017 12:05  Prostate Specific Ag, Serum Latest Ref Range: 0.0 - 4.0 ng/mL 198.0 (H) 242.1 (H)      Impression and Plan:   57 year old gentleman with the following issues:  1.  Advanced prostate cancer with disease to the bone as well as lymphadenopathy.  He is currently receiving Taxotere chemotherapy for castration resistant disease.  He tolerated chemotherapy reasonably well without any major complications.  His symptoms have been palliated reasonably well at the beginning.  His PSA is starting  to rise again after 5 cycles of therapy.  The natural course of this disease as well as the goal of chemotherapy was reviewed again.  Despite the mild rise in his PSA is alkaline phosphatase is declining which indicate a response of chemotherapy with positive effect and palliation of his bone pain.  Risks and benefits of continuing chemotherapy was reviewed today and is agreeable to continue.  We will continue to monitor his PSA and restage him with a CT scan and the bone scan his PSA starts to rise rapidly.  2. Androgen depravation: Testosterone appears castrate at this time currently at 23 in February 2019.  3. HIV:  He continues to be followed by Dr. Tommy Carlson.  No changes in his medication.  4.  Neutropenia prophylaxis: Growth factor support is indicated as long as he is receiving chemotherapy.  No complications noted.  5. Right hip and right arm pain: Controlled at this time with oxycodone.  Palliative radiation is scheduled in the near future.  6.  IV access: Port-A-Cath in use without any  recent complications.  7.  Goals of care: Treatment is palliative at this time and his disease cannot be cured.  His performance status remains adequate and desires aggressive therapy for his cancer.  Chemotherapy has been successful in palliation of his symptoms for the time being.  Different chemotherapy may be needed in the future once Taxotere becomes ineffective.  8.  Antiemetics: Controlled with Compazine at this time.  9. Follow-up: Will be in 3  weeks for the next cycle of chemotherapy.   25 minutes was spent with the patient face-to-face today.  More than 50% of time was dedicated to patient counseling, education and answering questions regarding goals of care as well as prognosis.    Zola Button, MD 3/29/201912:53 PM

## 2017-05-29 NOTE — Patient Instructions (Signed)
[facility] Discharge Instructions for Patients Receiving Chemotherapy  Today you received the following chemotherapy agents:  Taxotere (docetaxel)  To help prevent nausea and vomiting after your treatment, we encourage you to take your nausea medication as prescribed.   If you develop nausea and vomiting that is not controlled by your nausea medication, call the clinic.   BELOW ARE SYMPTOMS THAT SHOULD BE REPORTED IMMEDIATELY:  *FEVER GREATER THAN 100.5 F  *CHILLS WITH OR WITHOUT FEVER  NAUSEA AND VOMITING THAT IS NOT CONTROLLED WITH YOUR NAUSEA MEDICATION  *UNUSUAL SHORTNESS OF BREATH  *UNUSUAL BRUISING OR BLEEDING  TENDERNESS IN MOUTH AND THROAT WITH OR WITHOUT PRESENCE OF ULCERS  *URINARY PROBLEMS  *BOWEL PROBLEMS  UNUSUAL RASH Items with * indicate a potential emergency and should be followed up as soon as possible.  Feel free to call the clinic should you have any questions or concerns. The clinic phone number is [phone].  Please show the [facility] at check-in to the Emergency Department and triage nurse.

## 2017-05-30 LAB — PROSTATE-SPECIFIC AG, SERUM (LABCORP): PROSTATE SPECIFIC AG, SERUM: 267.7 ng/mL — AB (ref 0.0–4.0)

## 2017-06-02 MED FILL — BIKTARVY 50-200-25 MG TABS: 50-200-25 | 30 days supply | Qty: 30 | Fill #2

## 2017-06-04 ENCOUNTER — Encounter: Payer: Self-pay | Admitting: Radiation Oncology

## 2017-06-04 ENCOUNTER — Other Ambulatory Visit: Payer: Self-pay | Admitting: *Deleted

## 2017-06-04 NOTE — Progress Notes (Signed)
Histology and Location of Primary:Prostate cancer metastatic to bone with bone mets  Sites of Visceral and Bony Metastatic Disease: Right forearm, right sided hip lymph node, 10-05-18include calvarium, RIGHT shoulder, thoracic and lumbar spine, pelvis, LEFT femur, LEFT fibula, sternum, and probably RIGHT Clavicle.   Location(s) of Symptomatic Metastases: Right forearm pain, right sided hip pain  Past/Anticipated chemotherapy by medical oncology, if any: 05-29-17 Dr. Alen Blew  Current therapy:  Taxotere chemotherapy 60 mg/m2 every 3 weeks started on 02/06/2017  We will continue to monitor his PSA and restage him with a CT scan and the bone scan his PSA starts to rise rapidly.  Androgen depravation: Testosterone appears castrate at this time currently at 23 in February 2019  HIV:  He continues to be followed by Dr. [name].  No changes in his medication   Palliative radiation is scheduled in the near future.  IV access: Port-A-Cath in use without any recent complications.   Prior Therapy: He was treated with Lupron therapy at the time of diagnosis and had an excellent response with PSA down to 0.97 back in August 2015.   He developed castration resistant disease and PSA did rise to 18.76 in April 2016. [name] and discontinued because of poor tolerance.     Zytiga for a total dose of 1000 mg daily started on 02/15/2015. His dose was changed to 500 mg starting on 08/30/2015. Therapy discontinued in January 2018 because of progression of disease.  Palliative radiation therapy to the right hip under the care of Dr. Tammi Klippel. Therapy concluded in February 2018 after receiving total of 30 gray to the right hip.  Xtandi 160 mg daily started in January 2018.  Therapy discontinued in November 2018 because of progression of disease    12-05-16 Bone scan FINDINGS: Multiple sites of abnormal increased osseous tracer accumulation are identified compatible with widespread  osseous metastatic disease.  These include calvarium, RIGHT shoulder, thoracic and lumbar spine, pelvis, LEFT femur, LEFT fibula, sternum, and probably RIGHT clavicle.  Increased tracer accumulation at the proximal RIGHT forearm, predominately excluded outside of field of view, cannot exclude for metastasis.  Many of these sites are new or progressed since the previous exam.  Expected urinary tract and soft tissue distribution of tracer.  IMPRESSION: Widespread osseous metastatic disease progressive since previous exam.  New long bones sites of uptake are seen at the LEFT femur and LEFT lower leg as above.  Probable increased uptake in the proximal RIGHT forearm, beyond imaged field, recommend radiographic correlation.    Pain on a scale of 0-10 is: 6/10 right arm pain taking oxycodone   If Spine Met(s), symptoms, if any, include:  Bowel/Bladder retention or incontinence (please describe):IPSS 7 up every 1/2 hour at night  Numbness or weakness in extremities (please describe): Feet tingle Current Decadron regimen, if applicable: No Ambulatory status? Walker? Wheelchair?: Ambulates with a cane  SAFETY ISSUES: Prior radiation? :04-14-16  04-25-16 total of 30 gray to the right hip 03-07-13  05-24-13 prostate, seminal vesicles, and pelvic lymph nodes     Pacemaker/ICD? : No  Possible current pregnancy? :N/A  Is the patient on methotrexate? : No  Current Complaints / other details: Wt Readings from Last 3 Encounters:  06/10/17 199 lb (90.3 kg)  05/29/17 199 lb 1.6 oz (90.3 kg)  05/18/17 204 lb (92.5 kg)  BP 129/84 (BP Location: Right Arm, Patient Position: Sitting, Cuff Size: Normal)   Pulse 100   Temp 98.6 F (37 C) (Oral)   Resp  18   Ht _0  (1.803 m)   Wt 199 lb (90.3 kg)   SpO2 100%   BMI 27.75 kg/m

## 2017-06-05 ENCOUNTER — Telehealth: Payer: Self-pay | Admitting: Oncology

## 2017-06-05 NOTE — Telephone Encounter (Signed)
Call day - moved 4/19 appointments to earlier time. Spoke with patient he is aware.

## 2017-06-07 ENCOUNTER — Other Ambulatory Visit: Payer: Self-pay | Admitting: Radiation Oncology

## 2017-06-07 DIAGNOSIS — R948 Abnormal results of function studies of other organs and systems: Secondary | ICD-10-CM

## 2017-06-07 DIAGNOSIS — C61 Malignant neoplasm of prostate: Secondary | ICD-10-CM

## 2017-06-07 DIAGNOSIS — C7951 Secondary malignant neoplasm of bone: Secondary | ICD-10-CM

## 2017-06-08 ENCOUNTER — Telehealth: Payer: Self-pay | Admitting: *Deleted

## 2017-06-08 NOTE — Telephone Encounter (Signed)
CALLED PATIENT TO INFORM OF X-RAY OF FOREARM ON 06-09-17 @ 11 AM @ [facility], SPOKE WITH PATIENT AND HE IS AWARE OF THIS TEST

## 2017-06-09 ENCOUNTER — Ambulatory Visit (HOSPITAL_COMMUNITY)
Admission: RE | Admit: 2017-06-09 | Discharge: 2017-06-09 | Disposition: A | Discharge status: Home / Self Care | Payer: Medicare Other | Source: Ambulatory Visit | Attending: Radiation Oncology | Admitting: Radiation Oncology

## 2017-06-09 DIAGNOSIS — Z8546 Personal history of malignant neoplasm of prostate: Secondary | ICD-10-CM | POA: Diagnosis not present

## 2017-06-09 DIAGNOSIS — C7951 Secondary malignant neoplasm of bone: Secondary | ICD-10-CM | POA: Diagnosis not present

## 2017-06-09 DIAGNOSIS — R936 Abnormal findings on diagnostic imaging of limbs: Secondary | ICD-10-CM | POA: Diagnosis not present

## 2017-06-09 DIAGNOSIS — C61 Malignant neoplasm of prostate: Secondary | ICD-10-CM | POA: Diagnosis not present

## 2017-06-09 DIAGNOSIS — R948 Abnormal results of function studies of other organs and systems: Secondary | ICD-10-CM | POA: Diagnosis not present

## 2017-06-10 ENCOUNTER — Ambulatory Visit
Admission: RE | Admit: 2017-06-10 | Discharge: 2017-06-10 | Disposition: A | Discharge status: Home / Self Care | Payer: Medicare Other | Source: Ambulatory Visit | Attending: Radiation Oncology | Admitting: Radiation Oncology

## 2017-06-10 ENCOUNTER — Encounter: Payer: Self-pay | Admitting: *Deleted

## 2017-06-10 ENCOUNTER — Other Ambulatory Visit: Payer: Self-pay

## 2017-06-10 ENCOUNTER — Encounter: Payer: Self-pay | Admitting: Radiation Oncology

## 2017-06-10 VITALS — BP 129/84 | HR 100 | Temp 98.6°F | Resp 18 | Ht 71.0 in | Wt 199.0 lb

## 2017-06-10 DIAGNOSIS — Z79891 Long term (current) use of opiate analgesic: Secondary | ICD-10-CM | POA: Insufficient documentation

## 2017-06-10 DIAGNOSIS — C7982 Secondary malignant neoplasm of genital organs: Secondary | ICD-10-CM | POA: Insufficient documentation

## 2017-06-10 DIAGNOSIS — Z192 Hormone resistant malignancy status: Secondary | ICD-10-CM | POA: Diagnosis not present

## 2017-06-10 DIAGNOSIS — C61 Malignant neoplasm of prostate: Secondary | ICD-10-CM | POA: Insufficient documentation

## 2017-06-10 DIAGNOSIS — Z886 Allergy status to analgesic agent status: Secondary | ICD-10-CM | POA: Diagnosis not present

## 2017-06-10 DIAGNOSIS — C7951 Secondary malignant neoplasm of bone: Secondary | ICD-10-CM

## 2017-06-10 DIAGNOSIS — Z21 Asymptomatic human immunodeficiency virus [HIV] infection status: Secondary | ICD-10-CM | POA: Insufficient documentation

## 2017-06-10 DIAGNOSIS — Z923 Personal history of irradiation: Secondary | ICD-10-CM | POA: Diagnosis not present

## 2017-06-10 DIAGNOSIS — Z87891 Personal history of nicotine dependence: Secondary | ICD-10-CM | POA: Diagnosis not present

## 2017-06-10 DIAGNOSIS — Z79899 Other long term (current) drug therapy: Secondary | ICD-10-CM | POA: Diagnosis not present

## 2017-06-10 DIAGNOSIS — G893 Neoplasm related pain (acute) (chronic): Secondary | ICD-10-CM | POA: Diagnosis not present

## 2017-06-10 NOTE — Progress Notes (Signed)
[place] in spoke with Dean Carlson gas card given to assist with transportation to and from the [facility].

## 2017-06-10 NOTE — Progress Notes (Signed)
Histology and Location of Primary:Prostate cancer metastatic to bone with bone mets  Sites of Visceral and Bony Metastatic Disease: Right forearm, right sided hip lymph node, 10-05-18include calvarium, RIGHT shoulder, thoracic and lumbar spine, pelvis, LEFT femur, LEFT fibula, sternum, and probably RIGHT Clavicle.   Location(s) of Symptomatic Metastases: Right forearm pain, right sided hip pain  Past/Anticipated chemotherapy by medical oncology, if any: 05-29-17 Dr. Alen Blew  Current therapy:  Taxotere chemotherapy 60 mg/m2 every 3 weeks started on 02/06/2017  We will continue to monitor his PSA and restage him with a CT scan and the bone scan his PSA starts to rise rapidly.  Androgen depravation:Testosterone appears castrate at this time currently at 107 in February 2019  HIV: He continues to be followed by Dr. [name]. No changes in his medication  Palliative radiation is scheduled in the near future.  IV access: Port-A-Cath in use without any recent complications.   Prior Therapy: He was treated with Lupron therapy at the time of diagnosis and had an excellent response with PSA down to 0.97 back in August 2015.   He developed castration resistant disease and PSA did rise to 18.76 in April 2016. [name] and discontinued because of poor tolerance.   Zytiga for a total dose of 1000 mg daily started on 02/15/2015. His dose was changed to 500 mg starting on 08/30/2015. Therapy discontinued in January 2018 because of progression of disease.  Palliative radiation therapy to the right hip under the care of Dr. Tammi Klippel. Therapy concluded in February 2018 after receiving total of 30 gray to the right hip.  Xtandi 160 mg daily started in January 2018. Therapy discontinued in November 2018 because of progression of disease    12-05-16 Bone scan FINDINGS: Multiple sites of abnormal increased osseous tracer accumulation are identified compatible with  widespread osseous metastatic disease.  These include calvarium, RIGHT shoulder, thoracic and lumbar spine, pelvis, LEFT femur, LEFT fibula, sternum, and probably RIGHT clavicle.  Increased tracer accumulation at the proximal RIGHT forearm, predominately excluded outside of field of view, cannot exclude for metastasis.  Many of these sites are new or progressed since the previous exam.  Expected urinary tract and soft tissue distribution of tracer.  IMPRESSION: Widespread osseous metastatic disease progressive since previous exam.  New long bones sites of uptake are seen at the LEFT femur and LEFT lower leg as above.  Probable increased uptake in the proximal RIGHT forearm, beyond imaged field, recommend radiographic correlation.    Pain on a scale of 0-10 is: 6/10 right arm pain taking oxycodone   If Spine Met(s), symptoms, if any, include:  Bowel/Bladder retention or incontinence (please describe):IPSS 9 up every 1/2 hour at night  Numbness or weakness in extremities (please describe): Feet tingle Current Decadron regimen, if applicable: No Ambulatory status? Walker? Wheelchair?: Ambulates with a cane  SAFETY ISSUES: Prior radiation? :04-14-16  04-25-16 total of 30 gray to the right hip 03-07-13  05-24-13 prostate, seminal vesicles, and pelvic lymph nodes     Pacemaker/ICD? : No  Possible current pregnancy? :N/A  Is the patient on methotrexate? : No  Current Complaints / other details:    Wt Readings from Last 3 Encounters:  06/10/17 199 lb (90.3 kg)  05/29/17 199 lb 1.6 oz (90.3 kg)  05/18/17 204 lb (92.5 kg)  BP 129/84 (BP Location: Right Arm, Patient Position: Sitting, Cuff Size: Normal)   Pulse 100   Temp 98.6 F (37 C) (Oral)   Resp 18   Ht  _0  (1.803 m)   Wt 199 lb (90.3 kg)   SpO2 100%   BMI 27.75 kg/m

## 2017-06-10 NOTE — Progress Notes (Signed)
Radiation Oncology         [phone] ________________________________  Outpatient Re-Consultation  Name: Dean Carlson MRN: [id number]  Date of Service: 06/10/2017 DOB: 1960/11/03  CC:[place], [name], MD  Wyatt Portela, MD   REFERRING PHYSICIAN: Wyatt Portela, MD  DIAGNOSIS: 57 y.o. with advanced castrate resistance metastatic prostate carcinoma with painful metastasis to the right forearm.    ICD-10-CM   1. Bone metastasis (HCC) C79.51     HISTORY OF PRESENT ILLNESS: Dean Carlson is a 57 y.o. male seen at the request of Dr. Alen Blew with a history of castrate resistant, metastatic prostate cancer with pelvic adenopathy. He was diagnosed in 2014 when he had a Gleason 4+5 cancer with a PSA of 230.  PSA nadired to 1 with ADT and radiotherapy to the prostate and nodes.  He received radation 03/07/2013-05/24/2013.  Since treatment, the patient has continued on Lupron therapy. He had an excellent response and his PSA dropped down to 0.97 in August 2015. His PSA began to rise to 18.76 in April of 2016. His PSA was 30.85 on 10/19/14 therefore, he was started on Xtandi. The patient reported muscle cramps and stopped treatment after one week. His bone scan in May 2016 showed a questionable L2 sclerotic lesion but further evaluation with MRI of the lumbar spine on 08/11/14 showed no evidence of bony metastasis. Whole body scan on 01/07/16 showed increased activity over the right parietal, left temporal, and left occipital skull. It also showed increased activity over the right ischium. The patient was previously treated with Zytiga for a total dose of 1000 mg daily, but discontinued this due to disease progression.  He resumed Xtandi 160mg  daily in 03/2016.  He continued with progressive right hip pain so he underwent additional imaging of the pelvis/right femur on 03/28/16 which showed suspected osseous metastasis involving the right ischium extending to the right inferior pubic ramus corresponding to  previous abnormality.  He completed a course of palliative radiotherapy to the right hip in 04/2016 which provided significant relief.  Follow up systemic imaging with CT A/P and Bone scan 12/05/16 showed a mixed response to treatment on Xtandi so this was discontinued in 01/2017.  On imaging, there was evidence for progressive bony metastatic disease with increased uptake in the right forearm as well as new left femur and left lower leg sites.  He was started on Taxotere for systemic chemotherapy on 02/06/2017 and is currently status post completion of 6 cycles.  His PSA was 242 on May 08, 2017 and prior to that was 198 on 04/10/2017.   He has continued to have progressive pain in the right forearm despite use of oxycodone prn.  The oxycodone helps control the right hip pain but the right forearm pain is no longer well controlled with this medication.  Right forearm xrays were obtained on 06/09/17 which demonstrate a healing, non-displaced fracture of the mid proximal right radius with periosteal reaction with demineralization in that region as well as a few lucencies within the proximal right radius, suspicious for bony metastasis.   Therefore, he has been referred to [place] to discuss the potential role for palliative radiotherapy to the right forearm. He is accompanied by his sister.   Of note, the patient is HIV positive and remains stable- continues to be followed by Dr. [name].  PREVIOUS RADIATION THERAPY: Yes 03/07/2013-05/24/2013:  The prostate, seminal vesicles, and pelvic lymph nodes were initially treated to 45 gray in 25 fractions of 1.8 gray, then  the prostate only was boosted to 75 gray with 15 additional fractions of 2.0 gray.  2/12-2/23/18: Right ischium treated to 30 Gy in 10 fractions  PAST MEDICAL HISTORY:  Past Medical History:  Diagnosis Date  . Adjustment disorder with mixed anxiety and depressed mood 05/09/2015  . Asthma    as a child  . Dizziness 05/09/2015  . Grief 05/09/2015  . HIV  (human immunodeficiency virus infection) ([place])   . Insomnia 05/09/2015  . Memory deficit 09/05/2016  . Nausea without vomiting 10/20/2016  . Prostate cancer ([place])   . Weight gain 05/09/2015      PAST SURGICAL HISTORY: Past Surgical History:  Procedure Laterality Date  . COLONOSCOPY  05/13/2011   Procedure: COLONOSCOPY;  Surgeon: Jerene Bears, MD;  Location: [facility];  Service: Gastroenterology;  Laterality: N/A;  . IR FLUORO GUIDE PORT INSERTION RIGHT  01/21/2017  . IR US GUIDE VASC ACCESS RIGHT  01/21/2017  . PROSTATE BIOPSY  08/16/2012    FAMILY HISTORY:  Family History  Problem Relation Age of Onset  . Diabetes Unknown   . Cancer Neg Hx     SOCIAL HISTORY:  Social History   Socioeconomic History  . Marital status: Legally Separated    Spouse name: Not on file  . Number of children: Not on file  . Years of education: Not on file  . Highest education level: Not on file  Occupational History  . Occupation: permanant disability  Social Needs  . Financial resource strain: Not on file  . Food insecurity:    Worry: Not on file    Inability: Not on file  . Transportation needs:    Medical: Not on file    Non-medical: Not on file  Tobacco Use  . Smoking status: Former Smoker    Packs/day: 1.00    Types: Cigarettes    Last attempt to quit: 03/15/2011    Years since quitting: 6.2  . Smokeless tobacco: Never Used  Substance and Sexual Activity  . Alcohol use: No    Alcohol/week: 0.0 oz    Comment: occ  . Drug use: No  . Sexual activity: Not Currently  Lifestyle  . Physical activity:    Days per week: Not on file    Minutes per session: Not on file  . Stress: Not on file  Relationships  . Social connections:    Talks on phone: Not on file    Gets together: Not on file    Attends religious service: Not on file    Active member of club or organization: Not on file    Attends meetings of clubs or organizations: Not on file    Relationship status: Not on file  .  Intimate partner violence:    Fear of current or ex partner: Not on file    Emotionally abused: Not on file    Physically abused: Not on file    Forced sexual activity: Not on file  Other Topics Concern  . Not on file  Social History Narrative  . Not on file    ALLERGIES: Alka-seltzer [aspirin effervescent] and Excedrin extra strength [aspirin-acetaminophen-caffeine]  MEDICATIONS:  Current Outpatient Medications  Medication Sig Dispense Refill  . bictegravir-emtricitabine-tenofovir AF (BIKTARVY) 50-200-25 MG TABS tablet Take 1 tablet by mouth daily. 30 tablet 6  . CALCIUM PO Take 1 tablet by mouth daily.    . cyclobenzaprine (FLEXERIL) 10 MG tablet Take 1 tablet (10 mg total) by mouth 2 (two) times daily as needed for muscle  spasms. 30 tablet 0  . leuprolide (LUPRON) 30 MG injection Inject 30 mg into the muscle every 4 (four) months.    . lidocaine-prilocaine (EMLA) cream Apply a dime size to port-a-cath 1-2 hours prior to access. Do not rub in. Cover with [employer]. 30 g 1  . oxyCODONE (OXY IR/ROXICODONE) 5 MG immediate release tablet Take 1-2 tablets (5-10 mg total) by mouth every 4 (four) hours as needed for severe pain. 60 tablet 0  . prochlorperazine (COMPAZINE) 10 MG tablet Take 1 tablet (10 mg total) by mouth every 6 (six) hours as needed for nausea or vomiting. (Patient not taking: Reported on 06/10/2017) 30 tablet 0   No current facility-administered medications for this encounter.     REVIEW OF SYSTEMS:  On review of systems, the patient reports that he is doing well overall. He denies any chest pain, shortness of breath, cough, fevers, chills, night sweats, or unintended weight changes. He denies any bowel disturbances, and denies abdominal pain, nausea or vomiting. His IPSS score is 9, indicating moderate urinary symptoms with severe nocturia every 1/2 hour. He reports 6/10 pain in his right forearm that is unrelieved by oxycodone. His pain is worse with pronation/supination  and any use of the arm. He denies any recent forearm injury. He reports right-sided hip pain, better with laying on pillow and taking oxycodone. He ambulates with a cane. He reports tingling in his feet but denies numbness/tingling in the UEs.  A complete review of systems is obtained and is otherwise negative.  PHYSICAL EXAM:  Wt Readings from Last 3 Encounters:  06/10/17 199 lb (90.3 kg)  05/29/17 199 lb 1.6 oz (90.3 kg)  05/18/17 204 lb (92.5 kg)   Temp Readings from Last 3 Encounters:  06/10/17 98.6 F (37 C) (Oral)  05/29/17 98.7 F (37.1 C) (Oral)  05/18/17 99 F (37.2 C) (Oral)   BP Readings from Last 3 Encounters:  06/10/17 129/84  05/29/17 124/79  05/18/17 133/77   Pulse Readings from Last 3 Encounters:  06/10/17 100  05/29/17 94  05/18/17 87   Pain Assessment Pain Score: 6  Pain Loc: Arm/10  In general this is a well appearing African-American male in no acute distress. He is alert and oriented x4 and appropriate throughout the examination. HEENT reveals that the patient is normocephalic, atraumatic. EOMs are intact. PERRLA. Skin is intact without any evidence of gross lesions. Cardiovascular exam reveals a regular rate and rhythm, no clicks rubs or murmurs are auscultated. Chest is clear to auscultation bilaterally. Lymphatic assessment is performed and does not reveal any adenopathy in the supraclavicular, axillary, or inguinal chains. Abdomen has active bowel sounds in all quadrants and is intact. The abdomen is soft, non tender, non distended. Lower extremities are negative for pretibial pitting edema, deep calf tenderness, cyanosis or clubbing. Sensation is intact to light tough in the upper and lower extremities.  Grip strength is 5/5 and equal bilaterally.   KPS = 90  100 - Normal; no complaints; no evidence of disease. 90   - Able to carry on normal activity; minor signs or symptoms of disease. 80   - Normal activity with effort; some signs or symptoms of  disease. 60   - Cares for self; unable to carry on normal activity or to do active work. 60   - Requires occasional assistance, but is able to care for most of his personal needs. 50   - Requires considerable assistance and frequent medical care. 40   -  Disabled; requires special care and assistance. 20   - Severely disabled; hospital admission is indicated although death not imminent. 21   - Very sick; hospital admission necessary; active supportive treatment necessary. 10   - Moribund; fatal processes progressing rapidly. 0     - Dead  Karnofsky DA, Abelmann [place], Craver LS and Burchenal [facility] [phone]) The use of the nitrogen mustards in the palliative treatment of carcinoma: with particular reference to bronchogenic carcinoma Cancer 1 634-56  LABORATORY DATA:  Lab Results  Component Value Date   WBC 5.6 05/29/2017   HGB 11.1 (L) 05/29/2017   HCT 34.2 (L) 05/29/2017   MCV 91.0 05/29/2017   PLT 287 05/29/2017   Lab Results  Component Value Date   NA 138 05/29/2017   K 3.3 (L) 05/29/2017   CL 102 05/29/2017   CO2 27 05/29/2017   Lab Results  Component Value Date   ALT 11 05/29/2017   AST 15 05/29/2017   ALKPHOS 370 (H) 05/29/2017   BILITOT 0.4 05/29/2017     RADIOGRAPHY: Dg Forearm Right  Result Date: 06/09/2017 CLINICAL DATA:  History of metastatic prostate carcinoma, abnormal bone scan EXAM: RIGHT FOREARM - 2 VIEW COMPARISON:  Nuclear medicine total body bone scan of 12/05/2016 FINDINGS: The linear activity noted in the region of the proximal right forearm by prior bone scan appears to be due to healing fracture of the mid proximal right radius with periosteal reaction. No specific lytic or blastic lesion is seen, but an infiltrative process would be difficult to exclude. There is demineralization in that region and there are few lucencies within the proximal right radius of uncertain significance. IMPRESSION: 1. The linear activity noted by prior bone scan in the region of the  proximal right forearm appears to be due to healing fracture of the mid proximal right radius. 2. However that portion of the bone does appear to be somewhat demineralized with radiolucent lesions proximally and an infiltrative process cannot be excluded. Electronically Signed   By: Ivar Drape M.D.   On: 06/09/2017 15:26      IMPRESSION/PLAN: 1. 57 y.o. with advanced castrate resistance metastatic prostate carcinoma with painful metastasis to the right forearm. Today, we talked to the patient and his sister about the findings and workup thus far. We discussed the natural history of metastatic prostate carcinoma and general treatment, highlighting the role of palliative radiotherapy in the management of painful bony metastases. We discussed the available radiation techniques, and focused on the details of logistics and delivery. The recommendation is for 30 Gy to the right forearm delivered in 10 fxs over the course of 2 weeks. We reviewed the anticipated acute and late sequelae associated with radiation in this setting. The patient was encouraged to ask questions that were answered to his satisfaction.  At the conclusion of our conversation, the patient elects to proceed with palliative radiotherapy to the right forearm to 30 Gy delivered in 10 fxs over the course of 2 weeks. He is concerned with transportation issues for his daily radiation treatments and requests to defer treatment planning until 07/08/17.  We have him tentatively scheduled for 1pm on Wed. 07/08/17 in anticipation of beginning treatments on 07/09/17.  2. Social work referral to discuss options/resources for transportation assistance/gas cards.  He generally uses [employer] but is concerned with the financial burdens associated with transportation to an d from daily radiation treatment visits.   Nicholos Johns, PA-C    Tyler Pita, MD  [facility] Direct Dial: [phone]  Fax: [phone] .com   Skype  LinkedIn    Page Me   This document serves as a record of services personally performed by Tyler Pita, MD and Freeman Caldron, PA-C. It was created on their behalf by Rae Lips, a trained medical scribe. The creation of this record is based on the scribe's personal observations and the providers' statements to them. This document has been checked and approved by the attending providers.

## 2017-06-10 NOTE — Progress Notes (Signed)
[place] left a message for social services to speak with Mr. Dault per Burnett, PA-C. [phone] Called left a message for Toniann Ket to speak with Mr. Meals about a gas card to help with his transportation to and from the Cancer center for appointments.

## 2017-06-12 ENCOUNTER — Encounter: Payer: Self-pay | Admitting: General Practice

## 2017-06-12 NOTE — Progress Notes (Signed)
[place] CSW Progress Note  CSW received referral from [employer], states patient has difficulty w transportation.  CSW reviewed chart, saw patient has Medicaid.  Confirmed w [place] co DSS that he is eligible to use Medicaid transport.  DSS is sending referral to his Medicaid worker, asking that they contact patient to schedule required transportation assessment.  Called patient, explained process and advised that he should await call from [place].  Patient asked that I call sister, Mardene Celeste, to let her know of this free transportation resource.  CSW left her VM w details.  Edwyna Shell, LCSW Clinical Social Worker Phone:  [phone]

## 2017-06-19 ENCOUNTER — Inpatient Hospital Stay: Payer: Medicare Other | Attending: Oncology

## 2017-06-19 ENCOUNTER — Inpatient Hospital Stay: Payer: Medicare Other

## 2017-06-19 ENCOUNTER — Inpatient Hospital Stay (HOSPITAL_BASED_OUTPATIENT_CLINIC_OR_DEPARTMENT_OTHER): Payer: Medicare Other | Admitting: Oncology

## 2017-06-19 VITALS — BP 130/73 | HR 90 | Temp 99.4°F | Resp 20 | Ht 71.0 in | Wt 197.6 lb

## 2017-06-19 DIAGNOSIS — E291 Testicular hypofunction: Secondary | ICD-10-CM | POA: Diagnosis not present

## 2017-06-19 DIAGNOSIS — B20 Human immunodeficiency virus [HIV] disease: Secondary | ICD-10-CM | POA: Diagnosis not present

## 2017-06-19 DIAGNOSIS — C7951 Secondary malignant neoplasm of bone: Secondary | ICD-10-CM | POA: Insufficient documentation

## 2017-06-19 DIAGNOSIS — Z5111 Encounter for antineoplastic chemotherapy: Secondary | ICD-10-CM | POA: Diagnosis not present

## 2017-06-19 DIAGNOSIS — C61 Malignant neoplasm of prostate: Secondary | ICD-10-CM

## 2017-06-19 DIAGNOSIS — K5900 Constipation, unspecified: Secondary | ICD-10-CM | POA: Insufficient documentation

## 2017-06-19 DIAGNOSIS — Z923 Personal history of irradiation: Secondary | ICD-10-CM | POA: Diagnosis not present

## 2017-06-19 DIAGNOSIS — Z7189 Other specified counseling: Secondary | ICD-10-CM

## 2017-06-19 DIAGNOSIS — Z95828 Presence of other vascular implants and grafts: Secondary | ICD-10-CM

## 2017-06-19 LAB — CBC WITH DIFFERENTIAL/PLATELET
BASOS ABS: 0.1 10*3/uL (ref 0.0–0.1)
Basophils Relative: 1 %
EOS PCT: 1 %
Eosinophils Absolute: 0 10*3/uL (ref 0.0–0.5)
HCT: 32.7 % — ABNORMAL LOW (ref 38.4–49.9)
Hemoglobin: 10.6 g/dL — ABNORMAL LOW (ref 13.0–17.1)
LYMPHS ABS: 1.5 10*3/uL (ref 0.9–3.3)
LYMPHS PCT: 29 %
MCH: 29.5 pg (ref 27.2–33.4)
MCHC: 32.4 g/dL (ref 32.0–36.0)
MCV: 91 fL (ref 79.3–98.0)
MONO ABS: 0.9 10*3/uL (ref 0.1–0.9)
Monocytes Relative: 17 %
Neutro Abs: 2.7 10*3/uL (ref 1.5–6.5)
Neutrophils Relative %: 52 %
PLATELETS: 248 10*3/uL (ref 140–400)
RBC: 3.59 MIL/uL — AB (ref 4.20–5.82)
RDW: 18 % — AB (ref 11.0–14.6)
WBC: 5.1 10*3/uL (ref 4.0–10.3)

## 2017-06-19 LAB — COMPREHENSIVE METABOLIC PANEL
ALT: 9 U/L (ref 0–55)
AST: 13 U/L (ref 5–34)
Albumin: 3.8 g/dL (ref 3.5–5.0)
Alkaline Phosphatase: 339 U/L — ABNORMAL HIGH (ref 40–150)
Anion gap: 6 (ref 3–11)
BILIRUBIN TOTAL: 0.5 mg/dL (ref 0.2–1.2)
BUN: 9 mg/dL (ref 7–26)
CO2: 28 mmol/L (ref 22–29)
Calcium: 9.1 mg/dL (ref 8.4–10.4)
Chloride: 102 mmol/L (ref 98–109)
Creatinine, Ser: 0.69 mg/dL — ABNORMAL LOW (ref 0.70–1.30)
GFR calc Af Amer: >60 mL/min (ref >60)
GLUCOSE: 97 mg/dL (ref 70–140)
Potassium: 3.8 mmol/L (ref 3.5–5.1)
Sodium: 136 mmol/L (ref 136–145)
TOTAL PROTEIN: 7.5 g/dL (ref 6.4–8.3)

## 2017-06-19 MED ORDER — SODIUM CHLORIDE 0.9 % IV SOLN
60.0000 mg/m2 | Freq: Once | INTRAVENOUS | Status: AC
  Administered 2017-06-19: 130 mg via INTRAVENOUS
  Filled 2017-06-19: qty 13

## 2017-06-19 MED ORDER — HEPARIN SOD (PORK) LOCK FLUSH 100 UNIT/ML IV SOLN
500.0000 [IU] | Freq: Once | INTRAVENOUS | Status: AC | PRN
  Administered 2017-06-19: 500 [IU]
  Filled 2017-06-19: qty 5

## 2017-06-19 MED ORDER — DEXAMETHASONE SODIUM PHOSPHATE 10 MG/ML IJ SOLN
10.0000 mg | Freq: Once | INTRAMUSCULAR | Status: AC
  Administered 2017-06-19: 10 mg via INTRAVENOUS

## 2017-06-19 MED ORDER — SODIUM CHLORIDE 0.9 % IV SOLN
Freq: Once | INTRAVENOUS | Status: AC
  Administered 2017-06-19: 13:00:00 via INTRAVENOUS

## 2017-06-19 MED ORDER — SODIUM CHLORIDE 0.9% FLUSH
10.0000 mL | INTRAVENOUS | Status: DC | PRN
  Administered 2017-06-19: 10 mL via INTRAVENOUS
  Filled 2017-06-19: qty 10

## 2017-06-19 MED ORDER — SODIUM CHLORIDE 0.9% FLUSH
10.0000 mL | INTRAVENOUS | Status: DC | PRN
  Administered 2017-06-19: 10 mL
  Filled 2017-06-19: qty 10

## 2017-06-19 MED ORDER — PEGFILGRASTIM 6 MG/0.6ML ~~LOC~~ PSKT
PREFILLED_SYRINGE | SUBCUTANEOUS | Status: AC
  Filled 2017-06-19: qty 0.6

## 2017-06-19 MED ORDER — PEGFILGRASTIM 6 MG/0.6ML ~~LOC~~ PSKT
6.0000 mg | PREFILLED_SYRINGE | Freq: Once | SUBCUTANEOUS | Status: AC
  Administered 2017-06-19: 6 mg via SUBCUTANEOUS

## 2017-06-19 MED ORDER — DEXAMETHASONE SODIUM PHOSPHATE 10 MG/ML IJ SOLN
INTRAMUSCULAR | Status: AC
  Filled 2017-06-19: qty 1

## 2017-06-19 MED ORDER — OXYCODONE HCL 5 MG PO TABS: 5.0000 mg | ORAL_TABLET | ORAL | 0 refills | Status: DC | PRN

## 2017-06-19 NOTE — Patient Instructions (Signed)
[facility] Discharge Instructions for Patients Receiving Chemotherapy  Today you received the following chemotherapy agents:  Taxotere (docetaxel).  To help prevent nausea and vomiting after your treatment, we encourage you to take your nausea medication as prescribed.   If you develop nausea and vomiting that is not controlled by your nausea medication, call the clinic.   BELOW ARE SYMPTOMS THAT SHOULD BE REPORTED IMMEDIATELY:  *FEVER GREATER THAN 100.5 F  *CHILLS WITH OR WITHOUT FEVER  NAUSEA AND VOMITING THAT IS NOT CONTROLLED WITH YOUR NAUSEA MEDICATION  *UNUSUAL SHORTNESS OF BREATH  *UNUSUAL BRUISING OR BLEEDING  TENDERNESS IN MOUTH AND THROAT WITH OR WITHOUT PRESENCE OF ULCERS  *URINARY PROBLEMS  *BOWEL PROBLEMS  UNUSUAL RASH Items with * indicate a potential emergency and should be followed up as soon as possible.  Feel free to call the clinic should you have any questions or concerns. The clinic phone number is [phone].  Please show the [facility] at check-in to the Emergency Department and triage nurse.

## 2017-06-19 NOTE — Progress Notes (Signed)
Hematology and Oncology Follow Up Visit  Dean Carlson [id number] [birth year] 57 y.o. 06/19/2017 12:42 PM Dean Carlson, Dean Carlson, MDVan Dam, Dean Islam, MD   Principle Diagnosis: 57 year old man with castration-resistant prostate cancer with disease to the bone.  He was initially diagnosed in 2014 with a Gleason score of 4.5 equals 9 and a PSA of 230.   Prior Therapy: He was treated with Lupron therapy at the time of diagnosis and had an excellent response with PSA down to 0.97 back in August 2015.   He developed castration resistant disease and PSA did rise to 18.76 in April 2016. Dean Carlson was started and discontinued because of poor tolerance.     Zytiga for a total dose of 1000 mg daily started on 02/15/2015. His dose was changed to 500 mg starting on 08/30/2015. Therapy discontinued in January 2018 because of progression of disease.  Palliative radiation therapy to the right hip under the care of Dr. Tammi Klippel. Therapy concluded in February 2018 after receiving total of 30 gray to the right hip.  Xtandi 160 mg daily started in January 2018.  Therapy discontinued in November 2018 because of progression of disease.  Current therapy:  Taxotere chemotherapy 60 mg/m2 every 3 weeks started on 02/06/2017.  He is here for cycle 7 of therapy.  Interim History: Dean Carlson is here for a follow-up.  Since the last visit, he reports a few complaints.  He is noting a new protrusion on right aspect of his scalp that is tender to touch at times.  There is no erythema or induration or drainage noted.  He continues to have right-sided elbow pain and recently his left elbow is also hurting.  He is scheduled to start palliative radiation therapy in the near future.  He continues to tolerate chemotherapy without major complications.  He denies any nausea or excessive fatigue.  He denies any worsening neuropathy.  He denies any infusion related complications.  He does take oxycodone which helped his bone pain  regularly.  He does not report any headaches, blurry vision, syncope or seizures.  He denies any new neurological deficits.  Does not report any fevers, chills, sweats.  Appetite has been marginal although his weight is stable.  Does not report any chest pain, palpitation orthopnea or leg edema. Does not report any cough, hemoptysis or. Does not report any hematochezia or melena.  He does report occasional constipation.  He does not report any frequency urgency or hesitancy.  He denies any hematuria.  He does not report any skin rashes or lesions.  He does not report any lymphadenopathy or petechiae.  He does not report any petechiae or ecchymosis.  Remaining review of systems is negative.  Medications: No changes in his medication are unchanged by my review. Current Outpatient Medications  Medication Sig Dispense Refill  . bictegravir-emtricitabine-tenofovir AF (BIKTARVY) 50-200-25 MG TABS tablet Take 1 tablet by mouth daily. 30 tablet 6  . CALCIUM PO Take 1 tablet by mouth daily.    . cyclobenzaprine (FLEXERIL) 10 MG tablet Take 1 tablet (10 mg total) by mouth 2 (two) times daily as needed for muscle spasms. 30 tablet 0  . leuprolide (LUPRON) 30 MG injection Inject 30 mg into the muscle every 4 (four) months.    . lidocaine-prilocaine (EMLA) cream Apply a dime size to port-a-cath 1-2 hours prior to access. Do not rub in. Cover with [employer]. 30 g 1  . oxyCODONE (OXY IR/ROXICODONE) 5 MG immediate release tablet Take 1-2 tablets (5-10 mg  total) by mouth every 4 (four) hours as needed for severe pain. 60 tablet 0  . prochlorperazine (COMPAZINE) 10 MG tablet Take 1 tablet (10 mg total) by mouth every 6 (six) hours as needed for nausea or vomiting. (Patient not taking: Reported on 06/10/2017) 30 tablet 0   No current facility-administered medications for this visit.      Allergies:  Allergies  Allergen Reactions  . Alka-Seltzer [Aspirin Effervescent] Other (See Comments)    asthma  . Excedrin  Extra Strength [Aspirin-Acetaminophen-Caffeine] Other (See Comments)    REACTION: asthma    Past Medical History, Surgical history, Social history, and Family History updated without change today.   Physical Exam: Blood pressure 130/73, pulse 90, temperature 99.4 F (37.4 C), temperature source Oral, resp. rate 20, height 5\' 11"  (1.803 m), weight 197 lb 9.6 oz (89.6 kg), SpO2 100 %.   ECOG: 1 General appearance: Comfortable appearing gentleman without distress. Head: Left-sided protrusion and noted on the posterior aspect of his scalp.  No erythema or induration.  Tender to touch. Oral mucosa: No oral thrush or ulcers. Eyes: Pupils are equal and round reactive to light. Lymph nodes: Cervical, axillary or supraclavicular lymph nodes appear within normal range. Heart: Regular rate and rhythm without any murmurs or gallops. Lung: clear in all lung fields without any wheezes or dullness to percussion. Abdomin: Soft, nondistended with good bowel sounds.  No rebound or guarding. Musculoskeletal: Full range of motion noted in both elbows point tenderness noted on right more than left. Skin: No rashes or lesions. Neurological: No motor or sensory deficits. Psychiatric: Appropriate mood and affect.  Lab Results: Lab Results  Component Value Date   WBC 5.1 06/19/2017   HGB 10.6 (L) 06/19/2017   HCT 32.7 (L) 06/19/2017   MCV 91.0 06/19/2017   PLT 248 06/19/2017     Chemistry      Component Value Date/Time   NA 138 05/29/2017 1257   NA 140 02/27/2017 1104   K 3.3 (L) 05/29/2017 1257   K 3.4 (L) 02/27/2017 1104   CL 102 05/29/2017 1257   CO2 27 05/29/2017 1257   CO2 28 02/27/2017 1104   BUN 14 05/29/2017 1257   BUN 9.1 02/27/2017 1104   CREATININE 0.78 05/29/2017 1257   CREATININE 0.8 02/27/2017 1104      Component Value Date/Time   CALCIUM 9.2 05/29/2017 1257   CALCIUM 8.8 02/27/2017 1104   ALKPHOS 370 (H) 05/29/2017 1257   ALKPHOS 497 (H) 02/27/2017 1104   AST 15  05/29/2017 1257   AST 13 02/27/2017 1104   ALT 11 05/29/2017 1257   ALT 12 02/27/2017 1104   BILITOT 0.4 05/29/2017 1257   BILITOT 0.28 02/27/2017 1104       Results for Dean Carlson (MRN [id number]) as of 06/19/2017 12:47  Ref. Range 05/08/2017 12:05 05/29/2017 12:57  Prostate Specific Ag, Serum Latest Ref Range: 0.0 - 4.0 ng/mL 242.1 (H) 267.7 (H)      Impression and Plan:   57 year old man with:   1.  Castration-resistant prostate cancer with disease to the bone after progressing on multiple therapies.  He continues to tolerate the Taxotere chemotherapy without any major complications.  This therapy has palliated his symptoms although he started developing worsening symptoms at this time.  His PSA had an initial response but most recently it has been rising with PSA on 05/29/2017 was 267.  The plan is to proceed with the seventh cycle of Taxotere and obtain a bone scan in the  immediate future.  If he has a progression of disease which I anticipate, we might need to switch him to a different agent possibly Jevtana.  I anticipate reasonable tolerance to Jevtana if we have to switch and he will require the same schedule.   2. Androgen depravation: Testosterone level continues to be castrate without any additional therapy.  3. HIV: Continues to be under excellent control and  followed by Dr. Tommy Carlson.    4.  Neutropenia prophylaxis: He will continue to receive growth factor support after each cycle of chemotherapy.  5.  Bone pain: Related to prostate cancer.  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  I will asked Dr. Tammi Klippel to include possible evaluation for palliative radiation therapy to the skull.  Prescription for oxycodone was given to the patient today in the meantime.  6.  IV access: Port-A-Cath remains in use without any new complications.  7.  Goals of care: This continues to be addressed with him periodically and he understands he  has an incurable malignancy.  Goal of therapy is to palliate his disease and chemotherapy has been able to do so with some success that is potentially diminishing.  Change of chemotherapy may be needed and this was addressed with him today.  8. Follow-up: Will be in 3  weeks for the next cycle of chemotherapy.   25 minutes was spent with the patient face-to-face today.  More than 50% of time was dedicated to patient counseling, education and answering questions regarding goals of care as well as prognosis.    Zola Button, MD 4/19/201912:42 PM

## 2017-06-19 NOTE — Patient Instructions (Signed)

## 2017-06-20 LAB — PROSTATE-SPECIFIC AG, SERUM (LABCORP): Prostate Specific Ag, Serum: 278.7 ng/mL — ABNORMAL HIGH (ref 0.0–4.0)

## 2017-06-26 ENCOUNTER — Ambulatory Visit: Payer: Self-pay | Admitting: Radiation Oncology

## 2017-07-03 MED FILL — BIKTARVY 50-200-25 MG TABS: 50-200-25 | 30 days supply | Qty: 30 | Fill #3

## 2017-07-06 ENCOUNTER — Telehealth: Payer: Self-pay

## 2017-07-06 NOTE — Telephone Encounter (Signed)
Called to verify appointment per 5/6 phone que

## 2017-07-07 ENCOUNTER — Encounter: Payer: Self-pay | Admitting: *Deleted

## 2017-07-07 ENCOUNTER — Telehealth: Payer: Self-pay | Admitting: Radiation Oncology

## 2017-07-07 NOTE — Telephone Encounter (Signed)
Phoned patient and confirmed simulation appointment for tomorrow. Encouraged patient to have [facility] deliver him at 1245 for a 1 oclock simulation to [facility]. Explained the simulation will last an hour. Patient understands to request [facility] pick him up from the [facility] around 1415.

## 2017-07-08 ENCOUNTER — Ambulatory Visit
Admission: RE | Admit: 2017-07-08 | Discharge: 2017-07-08 | Disposition: A | Discharge status: Home / Self Care | Payer: Medicare Other | Source: Ambulatory Visit | Attending: Radiation Oncology | Admitting: Radiation Oncology

## 2017-07-08 DIAGNOSIS — C7951 Secondary malignant neoplasm of bone: Principal | ICD-10-CM

## 2017-07-08 DIAGNOSIS — C61 Malignant neoplasm of prostate: Secondary | ICD-10-CM

## 2017-07-10 ENCOUNTER — Inpatient Hospital Stay: Payer: Medicare Other

## 2017-07-10 ENCOUNTER — Inpatient Hospital Stay: Payer: Medicare Other | Attending: Oncology | Admitting: Oncology

## 2017-07-10 ENCOUNTER — Telehealth: Payer: Self-pay | Admitting: Oncology

## 2017-07-10 VITALS — BP 129/76 | HR 85 | Temp 98.8°F | Resp 17 | Ht 71.0 in | Wt 196.9 lb

## 2017-07-10 DIAGNOSIS — Z5189 Encounter for other specified aftercare: Secondary | ICD-10-CM | POA: Diagnosis not present

## 2017-07-10 DIAGNOSIS — C7951 Secondary malignant neoplasm of bone: Secondary | ICD-10-CM | POA: Insufficient documentation

## 2017-07-10 DIAGNOSIS — B20 Human immunodeficiency virus [HIV] disease: Secondary | ICD-10-CM | POA: Diagnosis not present

## 2017-07-10 DIAGNOSIS — C61 Malignant neoplasm of prostate: Secondary | ICD-10-CM

## 2017-07-10 DIAGNOSIS — Z5111 Encounter for antineoplastic chemotherapy: Secondary | ICD-10-CM | POA: Insufficient documentation

## 2017-07-10 DIAGNOSIS — E291 Testicular hypofunction: Secondary | ICD-10-CM | POA: Insufficient documentation

## 2017-07-10 DIAGNOSIS — Z95828 Presence of other vascular implants and grafts: Secondary | ICD-10-CM

## 2017-07-10 DIAGNOSIS — C779 Secondary and unspecified malignant neoplasm of lymph node, unspecified: Secondary | ICD-10-CM | POA: Diagnosis not present

## 2017-07-10 DIAGNOSIS — Z7189 Other specified counseling: Secondary | ICD-10-CM

## 2017-07-10 LAB — CBC WITH DIFFERENTIAL/PLATELET
Basophils Absolute: 0 10*3/uL (ref 0.0–0.1)
Basophils Relative: 0 %
Eosinophils Absolute: 0 10*3/uL (ref 0.0–0.5)
Eosinophils Relative: 1 %
HCT: 31.3 % — ABNORMAL LOW (ref 38.4–49.9)
HEMOGLOBIN: 10 g/dL — AB (ref 13.0–17.1)
Lymphocytes Relative: 30 %
Lymphs Abs: 1.4 10*3/uL (ref 0.9–3.3)
MCH: 29.5 pg (ref 27.2–33.4)
MCHC: 31.9 g/dL — AB (ref 32.0–36.0)
MCV: 92.3 fL (ref 79.3–98.0)
MONOS PCT: 20 %
Monocytes Absolute: 0.9 10*3/uL (ref 0.1–0.9)
NEUTROS PCT: 49 %
Neutro Abs: 2.2 10*3/uL (ref 1.5–6.5)
Platelets: 314 10*3/uL (ref 140–400)
RBC: 3.39 MIL/uL — ABNORMAL LOW (ref 4.20–5.82)
RDW: 16.8 % — ABNORMAL HIGH (ref 11.0–14.6)
WBC: 4.6 10*3/uL (ref 4.0–10.3)

## 2017-07-10 LAB — COMPREHENSIVE METABOLIC PANEL
ALT: 11 U/L (ref 0–55)
AST: 12 U/L (ref 5–34)
Albumin: 3.6 g/dL (ref 3.5–5.0)
Alkaline Phosphatase: 304 U/L — ABNORMAL HIGH (ref 40–150)
Anion gap: 7 (ref 3–11)
BUN: 11 mg/dL (ref 7–26)
CHLORIDE: 102 mmol/L (ref 98–109)
CO2: 29 mmol/L (ref 22–29)
CREATININE: 0.73 mg/dL (ref 0.70–1.30)
Calcium: 9.1 mg/dL (ref 8.4–10.4)
GFR calc non Af Amer: >60 mL/min (ref >60)
Glucose, Bld: 101 mg/dL (ref 70–140)
Potassium: 4 mmol/L (ref 3.5–5.1)
SODIUM: 138 mmol/L (ref 136–145)
Total Bilirubin: <0.2 mg/dL — ABNORMAL LOW (ref 0.2–1.2)
Total Protein: 7.8 g/dL (ref 6.4–8.3)

## 2017-07-10 MED ORDER — HEPARIN SOD (PORK) LOCK FLUSH 100 UNIT/ML IV SOLN
500.0000 [IU] | Freq: Once | INTRAVENOUS | Status: AC | PRN
  Administered 2017-07-10: 500 [IU]
  Filled 2017-07-10: qty 5

## 2017-07-10 MED ORDER — SODIUM CHLORIDE 0.9 % IV SOLN
60.0000 mg/m2 | Freq: Once | INTRAVENOUS | Status: AC
  Administered 2017-07-10: 130 mg via INTRAVENOUS
  Filled 2017-07-10: qty 13

## 2017-07-10 MED ORDER — SODIUM CHLORIDE 0.9 % IV SOLN
Freq: Once | INTRAVENOUS | Status: AC
  Administered 2017-07-10: 13:00:00 via INTRAVENOUS

## 2017-07-10 MED ORDER — DEXAMETHASONE SODIUM PHOSPHATE 10 MG/ML IJ SOLN
10.0000 mg | Freq: Once | INTRAMUSCULAR | Status: AC
  Administered 2017-07-10: 10 mg via INTRAVENOUS

## 2017-07-10 MED ORDER — PEGFILGRASTIM 6 MG/0.6ML ~~LOC~~ PSKT
PREFILLED_SYRINGE | SUBCUTANEOUS | Status: AC
  Filled 2017-07-10: qty 0.6

## 2017-07-10 MED ORDER — PEGFILGRASTIM 6 MG/0.6ML ~~LOC~~ PSKT
6.0000 mg | PREFILLED_SYRINGE | Freq: Once | SUBCUTANEOUS | Status: AC
  Administered 2017-07-10: 6 mg via SUBCUTANEOUS

## 2017-07-10 MED ORDER — SODIUM CHLORIDE 0.9% FLUSH
10.0000 mL | INTRAVENOUS | Status: DC | PRN
  Administered 2017-07-10: 10 mL
  Filled 2017-07-10: qty 10

## 2017-07-10 MED ORDER — SODIUM CHLORIDE 0.9% FLUSH
10.0000 mL | INTRAVENOUS | Status: DC | PRN
  Administered 2017-07-10: 10 mL via INTRAVENOUS
  Filled 2017-07-10: qty 10

## 2017-07-10 MED ORDER — DEXAMETHASONE SODIUM PHOSPHATE 10 MG/ML IJ SOLN
INTRAMUSCULAR | Status: AC
  Filled 2017-07-10: qty 1

## 2017-07-10 NOTE — Progress Notes (Signed)
Hematology and Oncology Follow Up Visit  Dean Carlson [id number] Aug 28, 1960 57 y.o. 07/10/2017 12:28 PM Dean Carlson, Dean Carlson, MDVan Ford Carlson, Dean Islam, MD   Principle Diagnosis: 57 year old man with castration-resistant prostate cancer documented in April 2016.   He was initially diagnosed in 2014 with a Gleason score of 4 + 5 = 9 and a PSA of 230.  He has disease to the bone as well as lymphadenopathy.  Prior Therapy: He was treated with Lupron therapy at the time of diagnosis and had an excellent response with PSA down to 0.97 back in August 2015.   He developed castration resistant disease and PSA did rise to 18.76 in April 2016. Dean Carlson was started and discontinued because of poor tolerance.     Zytiga for a total dose of 1000 mg daily started on 02/15/2015. His dose was changed to 500 mg starting on 08/30/2015. Therapy discontinued in January 2018 because of progression of disease.  Palliative radiation therapy to the right hip under the care of Dr. Tammi Klippel. Therapy concluded in February 2018 after receiving total of 30 gray to the right hip.  Xtandi 160 mg daily started in January 2018.  Therapy discontinued in November 2018 because of progression of disease.  Current therapy:  Taxotere chemotherapy 60 mg/m2 every 3 weeks started on 02/06/2017.  He is here for cycle 7 of therapy.  Interim History: Dean Carlson presents today for a follow-up visit.  He reports no major changes in his health or complications related to chemotherapy.  He does report grade 1 fatigue but still able to ambulate without any falls or syncope.  He does have diffuse bone pain that has been migratory in nature.  He reported both elbow pain in addition to pain in his skull as well as his right groin.  His appetite remained reasonable and his weight is stable.  He denies any nausea, vomiting or worsening neuropathy.  He denies any worsening diarrhea.  His quality of life remains maintained despite these complaints.  He  does not report any headaches, blurry vision, syncope or seizures.  He denies any alteration of mental status.  Does not report any fevers, chills, sweats.  Does not report any chest pain, palpitation orthopnea or leg edema. Does not report any cough, hemoptysis.  He denies dyspnea on exertion.  Does not report any hematochezia or melena.    He does not report any frequency urgency or hesitancy.  He denies any hematuria.  He does not report any skin rashes or lesions.  He does not report any lymphadenopathy or petechiae.  His mood remained stable without anxiety or depression.  Remaining review of systems is negative.  Medications: No changes in his medication are unchanged by my review. Current Outpatient Medications  Medication Sig Dispense Refill  . bictegravir-emtricitabine-tenofovir AF (BIKTARVY) 50-200-25 MG TABS tablet Take 1 tablet by mouth daily. 30 tablet 6  . CALCIUM PO Take 1 tablet by mouth daily.    . cyclobenzaprine (FLEXERIL) 10 MG tablet Take 1 tablet (10 mg total) by mouth 2 (two) times daily as needed for muscle spasms. 30 tablet 0  . leuprolide (LUPRON) 30 MG injection Inject 30 mg into the muscle every 4 (four) months.    . lidocaine-prilocaine (EMLA) cream Apply a dime size to port-a-cath 1-2 hours prior to access. Do not rub in. Cover with [employer]. 30 g 1  . oxyCODONE (OXY IR/ROXICODONE) 5 MG immediate release tablet Take 1-2 tablets (5-10 mg total) by mouth every 4 (four)  hours as needed for severe pain. 60 tablet 0  . prochlorperazine (COMPAZINE) 10 MG tablet Take 1 tablet (10 mg total) by mouth every 6 (six) hours as needed for nausea or vomiting. (Patient not taking: Reported on 06/10/2017) 30 tablet 0   No current facility-administered medications for this visit.      Allergies:  Allergies  Allergen Reactions  . Alka-Seltzer [Aspirin Effervescent] Other (See Comments)    asthma  . Excedrin Extra Strength [Aspirin-Acetaminophen-Caffeine] Other (See Comments)     REACTION: asthma    Past Medical History, Surgical history, Social history, and Family History updated without change today.   Physical Exam: Blood pressure 129/76, pulse 85, temperature 98.8 F (37.1 C), temperature source Oral, resp. rate 17, height 5\' 11"  (1.803 m), weight 196 lb 14.4 oz (89.3 kg), SpO2 100 %.   ECOG: 1 General appearance: Alert, awake pleasant gentleman without distress. Head: Atraumatic with a left protrusion remained unchanged. Oral mucosa: No oral thrush or ulcers. Eyes: Sclera anicteric. Lymph nodes: Palpable inguinal adenopathy noted.  No cervical or supraclavicular adenopathy noted. Heart: Regular rate without any murmurs rubs or gallops. Lung: Clear to auscultation without any rhonchi, wheezes or dullness to percussion. Abdomin: Soft, with good bowel sounds no rebound or guarding. Musculoskeletal: No clubbing or cyanosis. Skin: No ecchymosis or petechiae. Neurological: Intact motor, sensory exam without any deficits. Psychiatric: Appropriate mood and affect today.  Lab Results: Lab Results  Component Value Date   WBC 4.6 07/10/2017   HGB 10.0 (L) 07/10/2017   HCT 31.3 (L) 07/10/2017   MCV 92.3 07/10/2017   PLT 314 07/10/2017     Chemistry      Component Value Date/Time   NA 136 06/19/2017 1144   NA 140 02/27/2017 1104   K 3.8 06/19/2017 1144   K 3.4 (L) 02/27/2017 1104   CL 102 06/19/2017 1144   CO2 28 06/19/2017 1144   CO2 28 02/27/2017 1104   BUN 9 06/19/2017 1144   BUN 9.1 02/27/2017 1104   CREATININE 0.69 (L) 06/19/2017 1144   CREATININE 0.8 02/27/2017 1104      Component Value Date/Time   CALCIUM 9.1 06/19/2017 1144   CALCIUM 8.8 02/27/2017 1104   ALKPHOS 339 (H) 06/19/2017 1144   ALKPHOS 497 (H) 02/27/2017 1104   AST 13 06/19/2017 1144   AST 13 02/27/2017 1104   ALT 9 06/19/2017 1144   ALT 12 02/27/2017 1104   BILITOT 0.5 06/19/2017 1144   BILITOT 0.28 02/27/2017 1104       Results for Dean Carlson (MRN [id number]) as  of 07/10/2017 12:14  Ref. Range 05/29/2017 12:57 06/19/2017 11:44  Prostate Specific Ag, Serum Latest Ref Range: 0.0 - 4.0 ng/mL 267.7 (H) 278.7 (H)       Impression and Plan:   57 year old man with:   1.  Castration-resistant prostate cancer with disease to the bone and lymphadenopathy.  He is status post the outlined therapy that he has progressed on and currently receiving Taxotere chemotherapy without major complications.  This treatment has been able to palliate his disease reasonably well and maintain his quality of life.  His PSA is rising slowly in the last few cycles indicating possible progression of disease.  His alkaline phosphatase is declining which could also indicate response to therapy.  The plan is to proceed with his eighth cycle of chemotherapy today.  He will have staging work-up loading CT scan and a bone scan to assess his response to therapy.  He will receive treatment  break from chemotherapy in anticipation for palliative radiation therapy to multiple areas in the bone and possible inguinal adenopathy.  I plan to resume systemic chemotherapy with likely different agent in the next 4 to 6 weeks.  I prefer not to combine chemotherapy and radiation with him for better tolerance.  I discussed this plan extensively with Mr. Bueche and he is agreeable.  2. Androgen depravation: Testosterone in February 2019 remains very low indicating castration levels.  3. HIV: Managed and followed closely by Dr. Tommy Carlson.    4.  Neutropenia prophylaxis: No recent infection or sepsis.  He will continue to receive growth factor support after each cycle of chemotherapy.  5.  Bone pain: Reasonably managed with oxycodone.  I anticipate improvement after radiation therapy.  His bone pain is rather diffuse in nature in multiple location.  6.  IV access: Complications noted related to the use of Port-A-Cath administer chemotherapy.  7.  Goals of care: This was addressed with him again.  His  disease is incurable but certainly can be palliated further at this time.  His performance status remains adequate and aggressive therapy is warranted.  I am afraid that he is developing refractory disease and eventually we will run out of option to treat his cancer.  At that time I will discuss hospice with him which we have done previously.  8. Follow-up: Will be in 4 to 6 weeks to follow his progress.   25 minutes was spent with the patient face-to-face today.  More than 50% of time was dedicated to patient counseling, education and coordinating his multifaceted care.    Zola Button, MD 5/10/201912:28 PM

## 2017-07-10 NOTE — Telephone Encounter (Signed)
Appt scheduled AVS/Calendar printed per 5/10 los

## 2017-07-10 NOTE — Patient Instructions (Signed)
[facility] Discharge Instructions for Patients Receiving Chemotherapy  Today you received the following chemotherapy agents:  Taxotere (docetaxel).  To help prevent nausea and vomiting after your treatment, we encourage you to take your nausea medication as prescribed.   If you develop nausea and vomiting that is not controlled by your nausea medication, call the clinic.   BELOW ARE SYMPTOMS THAT SHOULD BE REPORTED IMMEDIATELY:  *FEVER GREATER THAN 100.5 F  *CHILLS WITH OR WITHOUT FEVER  NAUSEA AND VOMITING THAT IS NOT CONTROLLED WITH YOUR NAUSEA MEDICATION  *UNUSUAL SHORTNESS OF BREATH  *UNUSUAL BRUISING OR BLEEDING  TENDERNESS IN MOUTH AND THROAT WITH OR WITHOUT PRESENCE OF ULCERS  *URINARY PROBLEMS  *BOWEL PROBLEMS  UNUSUAL RASH Items with * indicate a potential emergency and should be followed up as soon as possible.  Feel free to call the clinic should you have any questions or concerns. The clinic phone number is [phone].  Please show the [facility] at check-in to the Emergency Department and triage nurse.

## 2017-07-11 LAB — PROSTATE-SPECIFIC AG, SERUM (LABCORP): PROSTATE SPECIFIC AG, SERUM: 331.6 ng/mL — AB (ref 0.0–4.0)

## 2017-07-14 NOTE — Progress Notes (Signed)
Thanks so much for taking care of Dean Carlson

## 2017-07-15 ENCOUNTER — Ambulatory Visit: Payer: Medicare Other

## 2017-07-16 ENCOUNTER — Ambulatory Visit: Payer: Medicare Other

## 2017-07-17 ENCOUNTER — Ambulatory Visit: Payer: Medicare Other

## 2017-07-17 ENCOUNTER — Encounter (HOSPITAL_COMMUNITY)
Admission: RE | Admit: 2017-07-17 | Discharge: 2017-07-17 | Disposition: A | Discharge status: Home / Self Care | Payer: Medicare Other | Source: Ambulatory Visit | Attending: Oncology | Admitting: Oncology

## 2017-07-17 ENCOUNTER — Other Ambulatory Visit: Payer: Self-pay | Admitting: *Deleted

## 2017-07-17 DIAGNOSIS — C7951 Secondary malignant neoplasm of bone: Secondary | ICD-10-CM | POA: Insufficient documentation

## 2017-07-17 DIAGNOSIS — C61 Malignant neoplasm of prostate: Secondary | ICD-10-CM | POA: Insufficient documentation

## 2017-07-17 MED ORDER — TECHNETIUM TC 99M MEDRONATE IV KIT
20.8000 | PACK | Freq: Once | INTRAVENOUS | Status: AC | PRN
  Administered 2017-07-17: 20.8 via INTRAVENOUS

## 2017-07-17 MED ORDER — OXYCODONE HCL 5 MG PO TABS: 5.0000 mg | ORAL_TABLET | ORAL | 0 refills | Status: DC | PRN

## 2017-07-20 ENCOUNTER — Ambulatory Visit: Payer: Medicare Other

## 2017-07-21 ENCOUNTER — Ambulatory Visit: Payer: Medicare Other

## 2017-07-22 ENCOUNTER — Ambulatory Visit: Payer: Medicare Other

## 2017-07-23 ENCOUNTER — Ambulatory Visit: Payer: Medicare Other

## 2017-07-24 ENCOUNTER — Ambulatory Visit: Payer: Medicare Other

## 2017-07-28 ENCOUNTER — Ambulatory Visit: Payer: Medicare Other

## 2017-07-29 ENCOUNTER — Ambulatory Visit: Payer: Medicare Other

## 2017-08-03 ENCOUNTER — Telehealth: Payer: Self-pay | Admitting: Radiation Oncology

## 2017-08-03 NOTE — Telephone Encounter (Signed)
Patient called with questions regarding length of appointment on 6/5 w/ radonc.  I advised appointment was scheduled for 1 hr per 6/3 phone call

## 2017-08-05 ENCOUNTER — Encounter: Payer: Self-pay | Admitting: Nutrition

## 2017-08-05 ENCOUNTER — Ambulatory Visit
Admission: RE | Admit: 2017-08-05 | Discharge: 2017-08-05 | Disposition: A | Discharge status: Home / Self Care | Payer: Medicare Other | Source: Ambulatory Visit | Attending: Radiation Oncology | Admitting: Radiation Oncology

## 2017-08-05 DIAGNOSIS — C7951 Secondary malignant neoplasm of bone: Secondary | ICD-10-CM | POA: Insufficient documentation

## 2017-08-05 DIAGNOSIS — C61 Malignant neoplasm of prostate: Secondary | ICD-10-CM | POA: Diagnosis not present

## 2017-08-05 NOTE — Progress Notes (Signed)
  Radiation Oncology         [phone] ________________________________  Name: Dean Carlson MRN: [id number]  Date: 08/05/2017  DOB: 27-Jan-1961  SIMULATION AND TREATMENT PLANNING NOTE    ICD-10-CM   1. Prostate cancer metastatic to bone [facility]) C61    C79.51     DIAGNOSIS:  57 yo man with painful right forearm bone metastasis from prostate cancer  NARRATIVE:  The patient was brought to the [facility].  Identity was confirmed.  All relevant records and images related to the planned course of therapy were reviewed.  The patient freely provided informed written consent to proceed with treatment after reviewing the details related to the planned course of therapy. The consent form was witnessed and verified by the simulation staff.  Then, the patient was set-up in a stable reproducible  supine position for radiation therapy.  CT images were obtained.  Surface markings were placed.  The CT images were loaded into the planning software.  Then the target and avoidance structures were contoured.  Treatment planning then occurred.  The radiation prescription was entered and confirmed.  Then, I designed and supervised the construction of a total of one medically necessary complex treatment device as thermoplastic hand mold.  I have requested : Isodose Plan.   PLAN:  The patient will receive 30 Gy in 10 fraction.  ________________________________  Sheral Apley Tammi Klippel, M.D.

## 2017-08-05 NOTE — Progress Notes (Signed)
Provided one complimentary case of Ensure Enlive. 

## 2017-08-06 DIAGNOSIS — C61 Malignant neoplasm of prostate: Secondary | ICD-10-CM | POA: Diagnosis not present

## 2017-08-06 MED FILL — BIKTARVY 50-200-25 MG TABS: 50-200-25 | 30 days supply | Qty: 30 | Fill #4

## 2017-08-10 ENCOUNTER — Other Ambulatory Visit: Payer: Medicare Other

## 2017-08-10 DIAGNOSIS — C61 Malignant neoplasm of prostate: Secondary | ICD-10-CM | POA: Diagnosis not present

## 2017-08-10 DIAGNOSIS — C7951 Secondary malignant neoplasm of bone: Secondary | ICD-10-CM | POA: Diagnosis not present

## 2017-08-10 DIAGNOSIS — B20 Human immunodeficiency virus [HIV] disease: Secondary | ICD-10-CM

## 2017-08-10 LAB — COMPLETE METABOLIC PANEL WITH GFR
AG Ratio: 1 (calc) (ref 1.0–2.5)
ALT: 5 U/L — ABNORMAL LOW (ref 9–46)
AST: 11 U/L (ref 10–35)
Albumin: 3.9 g/dL (ref 3.6–5.1)
Alkaline phosphatase (APISO): 336 U/L — ABNORMAL HIGH (ref 40–115)
BUN: 8 mg/dL (ref 7–25)
CO2: 31 mmol/L (ref 20–32)
Calcium: 9.2 mg/dL (ref 8.6–10.3)
Chloride: 93 mmol/L — ABNORMAL LOW (ref 98–110)
Creat: 1.01 mg/dL (ref 0.70–1.33)
GFR, Est African American: 95 mL/min/{1.73_m2} (ref >=60)
GFR, Est Non African American: 82 mL/min/{1.73_m2} (ref >=60)
Globulin: 4 g/dL (calc) — ABNORMAL HIGH (ref 1.9–3.7)
Glucose, Bld: 106 mg/dL — ABNORMAL HIGH (ref 65–99)
Potassium: 4.4 mmol/L (ref 3.5–5.3)
Sodium: 133 mmol/L — ABNORMAL LOW (ref 135–146)
Total Bilirubin: 0.6 mg/dL (ref 0.2–1.2)
Total Protein: 7.9 g/dL (ref 6.1–8.1)

## 2017-08-10 LAB — CBC WITH DIFFERENTIAL/PLATELET
BASOS PCT: 0.2 %
Basophils Absolute: 16 cells/uL (ref 0–200)
EOS PCT: 1.9 %
Eosinophils Absolute: 154 cells/uL (ref 15–500)
HCT: 31.9 % — ABNORMAL LOW (ref 38.5–50.0)
Hemoglobin: 10.7 g/dL — ABNORMAL LOW (ref 13.2–17.1)
Lymphs Abs: 1223 cells/uL (ref 850–3900)
MCH: 28.7 pg (ref 27.0–33.0)
MCHC: 33.5 g/dL (ref 32.0–36.0)
MCV: 85.5 fL (ref 80.0–100.0)
MONOS PCT: 10.9 %
MPV: 10.1 fL (ref 7.5–12.5)
NEUTROS PCT: 71.9 %
Neutro Abs: 5824 cells/uL (ref 1500–7800)
PLATELETS: 373 10*3/uL (ref 140–400)
RBC: 3.73 10*6/uL — AB (ref 4.20–5.80)
RDW: 15.2 % — AB (ref 11.0–15.0)
Total Lymphocyte: 15.1 %
WBC mixed population: 883 cells/uL (ref 200–950)
WBC: 8.1 10*3/uL (ref 3.8–10.8)

## 2017-08-11 LAB — T-HELPER CELL (CD4) - (RCID CLINIC ONLY)
CD4 T CELL ABS: 300 /uL — AB (ref 400–2700)
CD4 T CELL HELPER: 25 % — AB (ref 33–55)

## 2017-08-12 ENCOUNTER — Ambulatory Visit
Admission: RE | Admit: 2017-08-12 | Discharge: 2017-08-12 | Disposition: A | Discharge status: Home / Self Care | Payer: Medicare Other | Source: Ambulatory Visit | Attending: Radiation Oncology | Admitting: Radiation Oncology

## 2017-08-12 ENCOUNTER — Telehealth: Payer: Self-pay | Admitting: *Deleted

## 2017-08-12 ENCOUNTER — Other Ambulatory Visit: Payer: Self-pay | Admitting: Urology

## 2017-08-12 DIAGNOSIS — C61 Malignant neoplasm of prostate: Secondary | ICD-10-CM | POA: Diagnosis not present

## 2017-08-12 DIAGNOSIS — C7951 Secondary malignant neoplasm of bone: Secondary | ICD-10-CM | POA: Diagnosis not present

## 2017-08-12 LAB — HIV-1 RNA QUANT-NO REFLEX-BLD
HIV 1 RNA Quant: <20 copies/mL
HIV-1 RNA QUANT, LOG: NOT DETECTED {Log_copies}/mL

## 2017-08-12 MED ORDER — ONDANSETRON HCL 8 MG PO TABS: 8.0000 mg | ORAL_TABLET | Freq: Three times a day (TID) | ORAL | 0 refills | Status: AC | PRN

## 2017-08-12 MED FILL — ONDANSETRON HCL 8 MG TABLET: 8 | 30 days supply | Qty: 90 | Fill #0

## 2017-08-12 NOTE — Telephone Encounter (Signed)
Dr Johny Shears office called with concerns about Dean Carlson. He has dropped 20 pounds in 1 month, is now 175 lbs. They wanted to make sure he's been to [facility] for bloodwork, that he has follow up. Per chart review, patient has labs 6/10. Viral load was undetectable (as it has been since 2007), CD4 was 300. He is scheduled to follow up 6/24 with Dr [name]. Please advise, or contact Dr Tammi Klippel. Landis Gandy, RN

## 2017-08-12 NOTE — Progress Notes (Signed)
Received patient in the clinic following the first of ten intended radiation treatments to his right forearm. Patient requesting to see provider to request an appetite stimulant and "something for nausea." Patient reports nausea and vomiting x 2 weeks. Reports he has only been able to drink a little fluid and eat a few peaches over the last two weeks. Reports he has been unable to keep down crackers. Reports pain in his right hip, arm and skull 6 on a scale of 0-10. Reports headache and dizziness x 2 weeks. Reports confusion stuttered speech when he feels like he is being  Rushed. Reports a loss of appetite he believes is related to heightened sense of smell. Reports nocturia x 5-6. Reports voiding 3-4 times per day. Vitals stable. Patient is not actively receiving chemotherapy at this time. Twenty pound weight loss noted since 07/10/2017. Noted that Ernestene Kiel, RD provided patient with complimentary case of ENSURE on 08/05/2017. Informed Ashlyn Bruning, PA-C of these findings. She felt Dr. [name] or his PCP was best equipped to manage these symptoms since they weren't radiation induced. Ashlyn escribed a one time script for Zofran to [place] to hold him over until cause of poor appetite and malnutrition can be determined. Patient reported Compazine had stopped working. Wheeled patient to lobby to catch [facility] bus. Patient in no distress. Patient understands this RN will contact him at a later time with further directions once she is able to reach [employer]	[place] office (they close for lunch). Also, patient understands if his symptoms become worse he should present to [facility] emergency room.Phoned Dr. Lucianne Lei Dam's office and spoke with Sharyn Lull to determine if 08/24/2017 appointment could be moved up due to present symptoms. Sharyn Lull reports patient's CD4 was normal at 300 and viral load was less than 20 just two days ago. She opted to keep appointment as is for 6/24. Will inform Dr. Alen Blew of these  findings.

## 2017-08-13 ENCOUNTER — Ambulatory Visit: Payer: Medicare Other

## 2017-08-13 ENCOUNTER — Telehealth: Payer: Self-pay | Admitting: *Deleted

## 2017-08-13 NOTE — Telephone Encounter (Signed)
"  Was scheduled for appointment today, got times mixed up, I'll come in tomorrow.  I think I'm on yellow machine.  Unable to be worked in.  I can only come in at 12:15."  Transferred to Radiation Department for best help with this request.

## 2017-08-13 NOTE — Telephone Encounter (Signed)
We can certainly see Dean Carlson and at minimum get prompt labs. He has always always always been adherent to his ARVs. But he is also been grappling w his mortality so that could be a factor too. I was unaware of his dramatic weight loss. He has NOT wanted to see counselors much but would be good to have him See Rollene Fare as well

## 2017-08-14 ENCOUNTER — Encounter (HOSPITAL_COMMUNITY): Payer: Self-pay

## 2017-08-14 ENCOUNTER — Telehealth: Payer: Self-pay | Admitting: *Deleted

## 2017-08-14 ENCOUNTER — Ambulatory Visit (HOSPITAL_COMMUNITY)
Admission: RE | Admit: 2017-08-14 | Discharge: 2017-08-14 | Disposition: A | Discharge status: Home / Self Care | Payer: Medicare Other | Source: Ambulatory Visit | Attending: Oncology | Admitting: Oncology

## 2017-08-14 ENCOUNTER — Ambulatory Visit
Admission: RE | Admit: 2017-08-14 | Discharge: 2017-08-14 | Disposition: A | Discharge status: Home / Self Care | Payer: Medicare Other | Source: Ambulatory Visit | Attending: Radiation Oncology | Admitting: Radiation Oncology

## 2017-08-14 DIAGNOSIS — N3289 Other specified disorders of bladder: Secondary | ICD-10-CM | POA: Diagnosis not present

## 2017-08-14 DIAGNOSIS — N1330 Unspecified hydronephrosis: Secondary | ICD-10-CM | POA: Insufficient documentation

## 2017-08-14 DIAGNOSIS — C7951 Secondary malignant neoplasm of bone: Secondary | ICD-10-CM | POA: Diagnosis not present

## 2017-08-14 DIAGNOSIS — C61 Malignant neoplasm of prostate: Secondary | ICD-10-CM | POA: Diagnosis not present

## 2017-08-14 DIAGNOSIS — K449 Diaphragmatic hernia without obstruction or gangrene: Secondary | ICD-10-CM | POA: Insufficient documentation

## 2017-08-14 DIAGNOSIS — I700 Atherosclerosis of aorta: Secondary | ICD-10-CM | POA: Diagnosis not present

## 2017-08-14 DIAGNOSIS — C775 Secondary and unspecified malignant neoplasm of intrapelvic lymph nodes: Secondary | ICD-10-CM | POA: Insufficient documentation

## 2017-08-14 MED ORDER — IOPAMIDOL (ISOVUE-300) INJECTION 61%
INTRAVENOUS | Status: AC
  Filled 2017-08-14: qty 30

## 2017-08-14 MED ORDER — HEPARIN SOD (PORK) LOCK FLUSH 100 UNIT/ML IV SOLN
INTRAVENOUS | Status: AC
  Filled 2017-08-14: qty 5

## 2017-08-14 MED ORDER — IOPAMIDOL (ISOVUE-300) INJECTION 61%
INTRAVENOUS | Status: AC
  Administered 2017-08-14: 100 mL
  Filled 2017-08-14: qty 100

## 2017-08-14 MED ORDER — HEPARIN SOD (PORK) LOCK FLUSH 100 UNIT/ML IV SOLN
500.0000 [IU] | Freq: Once | INTRAVENOUS | Status: AC
  Administered 2017-08-14: 500 [IU] via INTRAVENOUS

## 2017-08-14 MED ORDER — IOPAMIDOL (ISOVUE-300) INJECTION 61%: 30.0000 mL | Freq: Once | INTRAVENOUS | Status: DC | PRN

## 2017-08-14 MED ORDER — IOPAMIDOL (ISOVUE-300) INJECTION 61%
30.0000 mL | Freq: Once | INTRAVENOUS | Status: AC | PRN
  Administered 2017-08-14: 30 mL via INTRAVENOUS

## 2017-08-14 NOTE — Telephone Encounter (Signed)
No I'm sorry I wasn't paying attention to those drawn his VL ID <20 and Cd4 300ish. From HIV standpoint everything is perfect. It's his prostate cancer that is the problem

## 2017-08-14 NOTE — Telephone Encounter (Signed)
Do you need labs in addition to the labs drawn 4 days ago?

## 2017-08-14 NOTE — Telephone Encounter (Signed)
Returned call to [facility] [phone]) regarding results. Under the Impression, please tell Dr. Alen Blew to look at #2. I told her I would give this message to him.

## 2017-08-14 NOTE — Telephone Encounter (Signed)
Results noted

## 2017-08-16 ENCOUNTER — Encounter (HOSPITAL_COMMUNITY): Payer: Self-pay | Admitting: Emergency Medicine

## 2017-08-16 ENCOUNTER — Emergency Department (HOSPITAL_COMMUNITY)
Admission: EM | Admit: 2017-08-16 | Discharge: 2017-08-17 | Disposition: A | Discharge status: Home / Self Care | Payer: Medicare Other | Attending: Emergency Medicine | Admitting: Emergency Medicine

## 2017-08-16 DIAGNOSIS — Z8546 Personal history of malignant neoplasm of prostate: Secondary | ICD-10-CM | POA: Diagnosis not present

## 2017-08-16 DIAGNOSIS — R319 Hematuria, unspecified: Secondary | ICD-10-CM | POA: Diagnosis not present

## 2017-08-16 DIAGNOSIS — R52 Pain, unspecified: Secondary | ICD-10-CM | POA: Diagnosis not present

## 2017-08-16 DIAGNOSIS — C7951 Secondary malignant neoplasm of bone: Secondary | ICD-10-CM

## 2017-08-16 DIAGNOSIS — R1111 Vomiting without nausea: Secondary | ICD-10-CM | POA: Diagnosis not present

## 2017-08-16 DIAGNOSIS — R112 Nausea with vomiting, unspecified: Secondary | ICD-10-CM | POA: Diagnosis not present

## 2017-08-16 DIAGNOSIS — B20 Human immunodeficiency virus [HIV] disease: Secondary | ICD-10-CM | POA: Insufficient documentation

## 2017-08-16 DIAGNOSIS — R310 Gross hematuria: Secondary | ICD-10-CM | POA: Diagnosis not present

## 2017-08-16 DIAGNOSIS — Z79899 Other long term (current) drug therapy: Secondary | ICD-10-CM | POA: Diagnosis not present

## 2017-08-16 DIAGNOSIS — E785 Hyperlipidemia, unspecified: Secondary | ICD-10-CM | POA: Diagnosis not present

## 2017-08-16 DIAGNOSIS — Z87891 Personal history of nicotine dependence: Secondary | ICD-10-CM | POA: Diagnosis not present

## 2017-08-16 DIAGNOSIS — J45909 Unspecified asthma, uncomplicated: Secondary | ICD-10-CM | POA: Insufficient documentation

## 2017-08-16 DIAGNOSIS — C61 Malignant neoplasm of prostate: Secondary | ICD-10-CM

## 2017-08-16 DIAGNOSIS — R109 Unspecified abdominal pain: Secondary | ICD-10-CM | POA: Diagnosis not present

## 2017-08-16 LAB — URINALYSIS, ROUTINE W REFLEX MICROSCOPIC
Bilirubin Urine: NEGATIVE
GLUCOSE, UA: NEGATIVE mg/dL
KETONES UR: 20 mg/dL — AB
Leukocytes, UA: NEGATIVE
Nitrite: NEGATIVE
PH: 8 (ref 5.0–8.0)
Protein, ur: 100 mg/dL — AB
RBC / HPF: >50 RBC/hpf — ABNORMAL HIGH (ref 0–5)
SPECIFIC GRAVITY, URINE: 1.015 (ref 1.005–1.030)

## 2017-08-16 MED ORDER — ONDANSETRON HCL 4 MG/2ML IJ SOLN
4.0000 mg | Freq: Once | INTRAMUSCULAR | Status: AC
  Administered 2017-08-16: 4 mg via INTRAVENOUS
  Filled 2017-08-16: qty 2

## 2017-08-16 MED ORDER — HYDROMORPHONE HCL 1 MG/ML IJ SOLN
0.5000 mg | Freq: Once | INTRAMUSCULAR | Status: AC
  Administered 2017-08-16: 0.5 mg via INTRAVENOUS
  Filled 2017-08-16: qty 1

## 2017-08-16 MED ORDER — SODIUM CHLORIDE 0.9 % IV BOLUS
500.0000 mL | Freq: Once | INTRAVENOUS | Status: AC
  Administered 2017-08-16: 500 mL via INTRAVENOUS

## 2017-08-16 NOTE — ED Provider Notes (Signed)
[facility] DEPT Provider Note   CSN: [id number] Arrival date & time: 08/16/17  2040     History   Chief Complaint Chief Complaint  Patient presents with  . Emesis  . Nausea  . Hematuria    HPI Dean Carlson is a 57 y.o. male.  Patient with a history of HIV, prostate cancer, asthma, presents with concern for hematuria that includes clots and difficulty urinating. He reports burning with urination as well. He has abdominal discomfort, nausea with vomiting, denies fever. He is currently being treated for prostate cancer by Dr. Alyson Ingles with last chemo 3 weeks ago and last radiation 3 days ago. Symptoms started after his last session of radiation. No CP, COB or diarrhea.   The history is provided by the patient. No language interpreter was used.  Emesis   Associated symptoms include abdominal pain. Pertinent negatives include no chills, no fever and no myalgias.  Hematuria  Associated symptoms include abdominal pain.    Past Medical History:  Diagnosis Date  . Adjustment disorder with mixed anxiety and depressed mood 05/09/2015  . Asthma    as a child  . Dizziness 05/09/2015  . Grief 05/09/2015  . HIV (human immunodeficiency virus infection) ([place])   . Insomnia 05/09/2015  . Memory deficit 09/05/2016  . Nausea without vomiting 10/20/2016  . Prostate cancer ([place])   . Weight gain 05/09/2015    Patient Active Problem List   Diagnosis Date Noted  . Port-A-Cath in place 03/20/2017  . Goals of care, counseling/discussion 01/13/2017  . Nausea without vomiting 10/20/2016  . Memory deficit 09/05/2016  . Bone metastasis ([place]) 04/07/2016  . Adjustment disorder with mixed anxiety and depressed mood 05/09/2015  . Insomnia 05/09/2015  . Weight gain 05/09/2015  . Dizziness 05/09/2015  . Grieving 05/09/2015  . Encounter for chemotherapy management 02/09/2015  . Allergic reaction 09/28/2014  . Prostate cancer metastatic to bone ([place]) 11/11/2012  . Elevated PSA  11/17/2011  . BPH (benign prostatic hyperplasia) 11/17/2011  . Prostatitis 07/22/2011  . Testicular pain 07/21/2011  . Colon cancer screening 05/13/2011  . Rectal bleeding 05/13/2011  . Abdominal pain, other specified site 04/23/2011  . Screening for STD (sexually transmitted disease) 07/22/2010  . Hypogonadism male 07/22/2010  . Smoker 07/22/2010  . MOOD DISORDER IN CONDITIONS CLASSIFIED ELSEWHERE 04/26/2009  . INSOMNIA UNSPECIFIED 10/30/2008  . SKIN RASH 01/24/2008  . HYPERLIPIDEMIA 08/24/2007  . ERECTILE DYSFUNCTION 08/24/2007  . HYPERGLYCEMIA 08/24/2007  . DISORDER, DEPRESSIVE NEC 12/31/2006  . CHEST PAIN, NON-CARDIAC 12/31/2006  . HIV disease ([place]) 04/14/2006  . HSV 04/14/2006  . PNEUMOCYSTIS PNEUMONIA 04/14/2006    Past Surgical History:  Procedure Laterality Date  . COLONOSCOPY  05/13/2011   Procedure: COLONOSCOPY;  Surgeon: Jerene Bears, MD;  Location: [facility];  Service: Gastroenterology;  Laterality: N/A;  . IR FLUORO GUIDE PORT INSERTION RIGHT  01/21/2017  . IR US GUIDE VASC ACCESS RIGHT  01/21/2017  . PROSTATE BIOPSY  08/16/2012        Home Medications    Prior to Admission medications   Medication Sig Start Date End Date Taking? Authorizing Provider  bictegravir-emtricitabine-tenofovir AF (BIKTARVY) 50-200-25 MG TABS tablet Take 1 tablet by mouth daily. 03/17/17  Yes [name], [name], MD  CALCIUM PO Take 1 tablet by mouth daily.   Yes [provider]  cyclobenzaprine (FLEXERIL) 10 MG tablet Take 1 tablet (10 mg total) by mouth 2 (two) times daily as needed for muscle spasms. 03/20/17  Yes Shadad,  Mathis Dad, MD  leuprolide (LUPRON) 30 MG injection Inject 30 mg into the muscle every 4 (four) months.   Yes [provider]  lidocaine-prilocaine (EMLA) cream Apply a dime size to port-a-cath 1-2 hours prior to access. Do not rub in. Cover with [employer]. 01/13/17  Yes Wyatt Portela, MD  ondansetron (ZOFRAN) 8 MG tablet Take 1 tablet (8 mg  total) by mouth every 8 (eight) hours as needed for nausea or vomiting. 08/12/17  Yes Bruning, Ashlyn, PA-C  oxyCODONE (OXY IR/ROXICODONE) 5 MG immediate release tablet Take 1-2 tablets (5-10 mg total) by mouth every 4 (four) hours as needed for severe pain. 07/17/17  Yes Wyatt Portela, MD  prochlorperazine (COMPAZINE) 10 MG tablet Take 1 tablet (10 mg total) by mouth every 6 (six) hours as needed for nausea or vomiting. 02/06/17  Yes Wyatt Portela, MD    Family History Family History  Problem Relation Age of Onset  . Diabetes Unknown   . Cancer Neg Hx     Social History Social History   Tobacco Use  . Smoking status: Former Smoker    Packs/day: 1.00    Types: Cigarettes    Last attempt to quit: 03/15/2011    Years since quitting: 6.4  . Smokeless tobacco: Never Used  Substance Use Topics  . Alcohol use: No    Alcohol/week: 0.0 oz    Comment: occ  . Drug use: No     Allergies   Alka-seltzer [aspirin effervescent] and Excedrin extra strength [aspirin-acetaminophen-caffeine]   Review of Systems Review of Systems  Constitutional: Negative for chills and fever.  Respiratory: Negative.   Cardiovascular: Negative.   Gastrointestinal: Positive for abdominal pain, nausea and vomiting.  Genitourinary: Positive for dysuria and hematuria. Negative for testicular pain.  Musculoskeletal: Negative.  Negative for back pain and myalgias.  Skin: Negative.   Neurological: Negative.      Physical Exam Updated Vital Signs BP (!) 147/88 (BP Location: Right Arm)   Pulse 96   Temp 98.1 F (36.7 C) (Oral)   Resp 19   SpO2 99%   Physical Exam  Constitutional: He is oriented to person, place, and time. He appears well-developed and well-nourished.  Neck: Normal range of motion.  Pulmonary/Chest: Effort normal.  Abdominal: Soft. Normal appearance. There is tenderness. There is no rigidity and no guarding. No hernia.    Genitourinary:  Genitourinary Comments: Circumcised. No  testicular tenderness or scrotal swelling.   Musculoskeletal: Normal range of motion.  Neurological: He is alert and oriented to person, place, and time.  Skin: Skin is warm and dry.  Psychiatric: He has a normal mood and affect.     ED Treatments / Results  Labs (all labs ordered are listed, but only abnormal results are displayed) Labs Reviewed  URINE CULTURE  CBC WITH DIFFERENTIAL/PLATELET  BASIC METABOLIC PANEL  URINALYSIS, ROUTINE W REFLEX MICROSCOPIC   Results for orders placed or performed during the hospital encounter of 08/16/17  CBC with Differential  Result Value Ref Range   WBC 7.3 4.0 - 10.5 K/uL   RBC 3.18 (L) 4.22 - 5.81 MIL/uL   Hemoglobin 9.1 (L) 13.0 - 17.0 g/dL   HCT 28.2 (L) 39.0 - 52.0 %   MCV 88.7 78.0 - 100.0 fL   MCH 28.6 26.0 - 34.0 pg   MCHC 32.3 30.0 - 36.0 g/dL   RDW 15.8 (H) 11.5 - 15.5 %   Platelets 359 150 - 400 K/uL   Neutrophils Relative % 74 %  Neutro Abs 5.3 1.7 - 7.7 K/uL   Lymphocytes Relative 15 %   Lymphs Abs 1.1 0.7 - 4.0 K/uL   Monocytes Relative 11 %   Monocytes Absolute 0.8 0.1 - 1.0 K/uL   Eosinophils Relative 0 %   Eosinophils Absolute 0.0 0.0 - 0.7 K/uL   Basophils Relative 0 %   Basophils Absolute 0.0 0.0 - 0.1 K/uL  Basic metabolic panel  Result Value Ref Range   Sodium 136 135 - 145 mmol/L   Potassium 3.9 3.5 - 5.1 mmol/L   Chloride 94 (L) 101 - 111 mmol/L   CO2 32 22 - 32 mmol/L   Glucose, Bld 113 (H) 65 - 99 mg/dL   BUN 16 6 - 20 mg/dL   Creatinine, Ser 1.00 0.61 - 1.24 mg/dL   Calcium 9.0 8.9 - 10.3 mg/dL   GFR calc non Af Amer >60 >60 mL/min   GFR calc Af Amer >60 >60 mL/min   Anion gap 10 5 - 15  Urinalysis, Routine w reflex microscopic  Result Value Ref Range   Color, Urine RED (A) YELLOW   APPearance CLOUDY (A) CLEAR   Specific Gravity, Urine 1.015 1.005 - 1.030   pH 8.0 5.0 - 8.0   Glucose, UA NEGATIVE NEGATIVE mg/dL   Hgb urine dipstick LARGE (A) NEGATIVE   Bilirubin Urine NEGATIVE NEGATIVE    Ketones, ur 20 (A) NEGATIVE mg/dL   Protein, ur 100 (A) NEGATIVE mg/dL   Nitrite NEGATIVE NEGATIVE   Leukocytes, UA NEGATIVE NEGATIVE   RBC / HPF >50 (H) 0 - 5 RBC/hpf   WBC, UA 6-10 0 - 5 WBC/hpf   Bacteria, UA RARE (A) NONE SEEN   Mucus PRESENT     EKG None  Radiology No results found.  Procedures Procedures (including critical care time)  Medications Ordered in ED Medications - No data to display   Initial Impression / Assessment and Plan / ED Course  I have reviewed the triage vital signs and the nursing notes.  Pertinent labs & imaging results that were available during my care of the patient were reviewed by me and considered in my medical decision making (see chart for details).     Patient presents with concern for hematuria that started 3 days ago. He has prostate cancer with pelvic mets and a new forearm bony metastatic site that received radiation 3 days ago. No fever. He report nausea but this has been ongoing. Was on Compazine, now has Zofran but has not used this at home. Zofran here controls nausea.   Labs are reassuring. No evidence of infection. CT from 08/14/17 shows hydroureteronephrosis on right with ureteral stricture likely from extrinsic mass. This was discussed with urology and is felt appropriate for outpatient follow up.    Final Clinical Impressions(s) / ED Diagnoses   Final diagnoses:  None   1. Hematuria 2. History prostate cancer  ED Discharge Orders    None       Charlann Lange, PA-C 08/17/17 [id number]    Fredia Sorrow, MD 08/19/17 [phone]

## 2017-08-16 NOTE — ED Notes (Signed)
Bed: WA09 Expected date:  Expected time:  Means of arrival:  Comments: Hematuria-CA pt

## 2017-08-16 NOTE — ED Triage Notes (Signed)
After patient had his radiation treatment for prostate cancer Friday, he reports having nausea, vomiting and blood (clots) in the urine. He also reports burning during urination. His last chemo was 3 weeks ago.

## 2017-08-17 ENCOUNTER — Telehealth: Payer: Self-pay | Admitting: Radiation Oncology

## 2017-08-17 ENCOUNTER — Ambulatory Visit: Payer: Medicare Other

## 2017-08-17 DIAGNOSIS — R310 Gross hematuria: Secondary | ICD-10-CM | POA: Diagnosis not present

## 2017-08-17 LAB — CBC WITH DIFFERENTIAL/PLATELET
Basophils Absolute: 0 10*3/uL (ref 0.0–0.1)
Basophils Relative: 0 %
Eosinophils Absolute: 0 10*3/uL (ref 0.0–0.7)
Eosinophils Relative: 0 %
HEMATOCRIT: 28.2 % — AB (ref 39.0–52.0)
HEMOGLOBIN: 9.1 g/dL — AB (ref 13.0–17.0)
LYMPHS PCT: 15 %
Lymphs Abs: 1.1 10*3/uL (ref 0.7–4.0)
MCH: 28.6 pg (ref 26.0–34.0)
MCHC: 32.3 g/dL (ref 30.0–36.0)
MCV: 88.7 fL (ref 78.0–100.0)
MONO ABS: 0.8 10*3/uL (ref 0.1–1.0)
MONOS PCT: 11 %
NEUTROS ABS: 5.3 10*3/uL (ref 1.7–7.7)
Neutrophils Relative %: 74 %
Platelets: 359 10*3/uL (ref 150–400)
RBC: 3.18 MIL/uL — ABNORMAL LOW (ref 4.22–5.81)
RDW: 15.8 % — AB (ref 11.5–15.5)
WBC: 7.3 10*3/uL (ref 4.0–10.5)

## 2017-08-17 LAB — BASIC METABOLIC PANEL
Anion gap: 10 (ref 5–15)
BUN: 16 mg/dL (ref 6–20)
CALCIUM: 9 mg/dL (ref 8.9–10.3)
CHLORIDE: 94 mmol/L — AB (ref 101–111)
CO2: 32 mmol/L (ref 22–32)
CREATININE: 1 mg/dL (ref 0.61–1.24)
GFR calc Af Amer: >60 mL/min (ref >60)
GFR calc non Af Amer: >60 mL/min (ref >60)
GLUCOSE: 113 mg/dL — AB (ref 65–99)
Potassium: 3.9 mmol/L (ref 3.5–5.1)
Sodium: 136 mmol/L (ref 135–145)

## 2017-08-17 MED ORDER — OXYCODONE HCL 5 MG PO TABS: 5.0000 mg | ORAL_TABLET | ORAL | 0 refills | Status: DC | PRN

## 2017-08-17 MED ORDER — HYDROMORPHONE HCL 1 MG/ML IJ SOLN
0.5000 mg | Freq: Once | INTRAMUSCULAR | Status: AC
  Administered 2017-08-17: 0.5 mg via INTRAVENOUS
  Filled 2017-08-17: qty 1

## 2017-08-17 NOTE — Discharge Instructions (Addendum)
You can be discharged home and will need to contact urology (Dr. Alyson Ingles) tomorrow to schedule a follow up appointment to recheck the blood in your urine.

## 2017-08-17 NOTE — Telephone Encounter (Signed)
Received voicemail message from patient that he wished to cancel radiation to his left forearm today. Patient explained he was in the emergency room over the weekend and wanted to take today to rest. Informed Melanie, RT on L3 of this finding. Also, informed Dr. Tammi Klippel, Freeman Caldron and Shona Simpson of findings associated with his ER visit.

## 2017-08-17 NOTE — ED Notes (Signed)
Unable to obtain a second set of blood cultures.

## 2017-08-18 ENCOUNTER — Telehealth: Payer: Self-pay | Admitting: Pharmacist

## 2017-08-18 ENCOUNTER — Inpatient Hospital Stay: Payer: Medicare Other | Attending: Oncology

## 2017-08-18 ENCOUNTER — Inpatient Hospital Stay: Payer: Medicare Other

## 2017-08-18 ENCOUNTER — Ambulatory Visit
Admission: RE | Admit: 2017-08-18 | Discharge: 2017-08-18 | Disposition: A | Discharge status: Home / Self Care | Payer: Medicare Other | Source: Ambulatory Visit | Attending: Radiation Oncology | Admitting: Radiation Oncology

## 2017-08-18 DIAGNOSIS — C61 Malignant neoplasm of prostate: Secondary | ICD-10-CM | POA: Diagnosis not present

## 2017-08-18 DIAGNOSIS — C7951 Secondary malignant neoplasm of bone: Secondary | ICD-10-CM | POA: Diagnosis not present

## 2017-08-18 LAB — URINE CULTURE
Culture: NO GROWTH
Culture: NO GROWTH

## 2017-08-18 NOTE — Telephone Encounter (Signed)
Thanks so much Dean Carlson. I heard he lost a lot of weight according to his radiation oncologist

## 2017-08-18 NOTE — Telephone Encounter (Signed)
Dean Carlson called and wanted to talk to a pharmacist regarding his Dean Carlson.  He went out of town this past weekend and forgot to bring it with him, so he missed 3 doses.  He is usually very adherent and never misses doses. He has been undetectable since 2011.  I told him that it was ok that he missed the 3 doses but to start taking it back again and to not miss anymore. He is tolerating it fine as well but going through some problems with his chemo and radiation. He felt better after the conversation, and I told him I would let Dr. [name] know.

## 2017-08-19 ENCOUNTER — Ambulatory Visit
Admission: RE | Admit: 2017-08-19 | Discharge: 2017-08-19 | Disposition: A | Discharge status: Home / Self Care | Payer: Medicare Other | Source: Ambulatory Visit | Attending: Radiation Oncology | Admitting: Radiation Oncology

## 2017-08-19 DIAGNOSIS — C7951 Secondary malignant neoplasm of bone: Secondary | ICD-10-CM | POA: Diagnosis not present

## 2017-08-19 DIAGNOSIS — C61 Malignant neoplasm of prostate: Secondary | ICD-10-CM | POA: Diagnosis not present

## 2017-08-20 ENCOUNTER — Ambulatory Visit: Payer: Medicare Other

## 2017-08-20 ENCOUNTER — Other Ambulatory Visit: Payer: Self-pay | Admitting: *Deleted

## 2017-08-20 ENCOUNTER — Telehealth: Payer: Self-pay | Admitting: *Deleted

## 2017-08-20 DIAGNOSIS — C7951 Secondary malignant neoplasm of bone: Secondary | ICD-10-CM

## 2017-08-20 DIAGNOSIS — C61 Malignant neoplasm of prostate: Secondary | ICD-10-CM

## 2017-08-20 MED ORDER — OXYCODONE HCL 5 MG PO TABS: 5.0000 mg | ORAL_TABLET | ORAL | 0 refills | Status: AC | PRN

## 2017-08-20 NOTE — Telephone Encounter (Signed)
Ok to refill. I agree with hospice.

## 2017-08-20 NOTE — Telephone Encounter (Signed)
CALLED PATIENT TO INFORM OF NUTRITION APPT. FOR 08-26-17 @ 9:45 AM, SPOKE WITH PATIENT AND HE IS AWARE OF THIS APPT.

## 2017-08-20 NOTE — Telephone Encounter (Signed)
Patient has chosen [facility], left VM for nurse Manus Gunning. Dr Alen Blew to be the attending, hospice physicians may do symptom management and have DNR signed per patient and families wishes.

## 2017-08-20 NOTE — Telephone Encounter (Signed)
Patient calling for refill on oxycodone 5 mg tablets and would like a referral to [facility] in [place]. Wants to cancel all of his appts with dr Alen Blew.

## 2017-08-21 ENCOUNTER — Ambulatory Visit: Payer: Medicare Other

## 2017-08-21 ENCOUNTER — Inpatient Hospital Stay: Payer: Medicare Other | Admitting: Oncology

## 2017-08-21 DIAGNOSIS — B20 Human immunodeficiency virus [HIV] disease: Secondary | ICD-10-CM | POA: Diagnosis not present

## 2017-08-21 DIAGNOSIS — C7951 Secondary malignant neoplasm of bone: Secondary | ICD-10-CM | POA: Diagnosis not present

## 2017-08-21 DIAGNOSIS — C61 Malignant neoplasm of prostate: Secondary | ICD-10-CM | POA: Diagnosis not present

## 2017-08-21 DIAGNOSIS — Z192 Hormone resistant malignancy status: Secondary | ICD-10-CM | POA: Diagnosis not present

## 2017-08-22 LAB — CULTURE, BLOOD (ROUTINE X 2)
CULTURE: NO GROWTH
SPECIAL REQUESTS: ADEQUATE

## 2017-08-23 DIAGNOSIS — B20 Human immunodeficiency virus [HIV] disease: Secondary | ICD-10-CM | POA: Diagnosis not present

## 2017-08-23 DIAGNOSIS — C7951 Secondary malignant neoplasm of bone: Secondary | ICD-10-CM | POA: Diagnosis not present

## 2017-08-23 DIAGNOSIS — Z192 Hormone resistant malignancy status: Secondary | ICD-10-CM | POA: Diagnosis not present

## 2017-08-23 DIAGNOSIS — C61 Malignant neoplasm of prostate: Secondary | ICD-10-CM | POA: Diagnosis not present

## 2017-08-24 ENCOUNTER — Ambulatory Visit: Payer: Medicare Other

## 2017-08-24 ENCOUNTER — Encounter: Payer: Medicare Other | Admitting: Infectious Disease

## 2017-08-24 DIAGNOSIS — Z192 Hormone resistant malignancy status: Secondary | ICD-10-CM | POA: Diagnosis not present

## 2017-08-24 DIAGNOSIS — C61 Malignant neoplasm of prostate: Secondary | ICD-10-CM | POA: Diagnosis not present

## 2017-08-24 DIAGNOSIS — C7951 Secondary malignant neoplasm of bone: Secondary | ICD-10-CM | POA: Diagnosis not present

## 2017-08-24 DIAGNOSIS — B20 Human immunodeficiency virus [HIV] disease: Secondary | ICD-10-CM | POA: Diagnosis not present

## 2017-08-25 ENCOUNTER — Encounter: Payer: Self-pay | Admitting: *Deleted

## 2017-08-25 ENCOUNTER — Ambulatory Visit: Payer: Medicare Other

## 2017-08-25 NOTE — Progress Notes (Signed)
[place] Work  Clinical Social Work was referred by radiation oncology for assessment of psychosocial needs.  Clinical Social Worker unable to reach patient, spoke with niece Phineas Real, to offer support and assess for needs.        Kennith Center, LCSW  Clinical Social Worker [facility]

## 2017-08-26 ENCOUNTER — Ambulatory Visit: Payer: Medicare Other

## 2017-08-26 ENCOUNTER — Encounter: Payer: Self-pay | Admitting: Nutrition

## 2017-08-26 ENCOUNTER — Inpatient Hospital Stay: Payer: Medicare Other | Admitting: Nutrition

## 2017-08-26 DIAGNOSIS — B20 Human immunodeficiency virus [HIV] disease: Secondary | ICD-10-CM | POA: Diagnosis not present

## 2017-08-26 DIAGNOSIS — C61 Malignant neoplasm of prostate: Secondary | ICD-10-CM | POA: Diagnosis not present

## 2017-08-26 DIAGNOSIS — Z743 Need for continuous supervision: Secondary | ICD-10-CM | POA: Diagnosis not present

## 2017-08-26 DIAGNOSIS — Z192 Hormone resistant malignancy status: Secondary | ICD-10-CM | POA: Diagnosis not present

## 2017-08-26 DIAGNOSIS — C7951 Secondary malignant neoplasm of bone: Secondary | ICD-10-CM | POA: Diagnosis not present

## 2017-08-26 DIAGNOSIS — R279 Unspecified lack of coordination: Secondary | ICD-10-CM | POA: Diagnosis not present

## 2017-08-26 NOTE — Progress Notes (Signed)
Patient did not attend nutrition consult this morning.  Noted he is being followed by hospice.  Will not reschedule.

## 2017-08-27 ENCOUNTER — Ambulatory Visit: Payer: Medicare Other

## 2017-08-28 DIAGNOSIS — C61 Malignant neoplasm of prostate: Secondary | ICD-10-CM | POA: Diagnosis not present

## 2017-08-28 DIAGNOSIS — C7951 Secondary malignant neoplasm of bone: Secondary | ICD-10-CM | POA: Diagnosis not present

## 2017-08-28 DIAGNOSIS — B20 Human immunodeficiency virus [HIV] disease: Secondary | ICD-10-CM | POA: Diagnosis not present

## 2017-08-28 DIAGNOSIS — Z192 Hormone resistant malignancy status: Secondary | ICD-10-CM | POA: Diagnosis not present

## 2017-08-31 DIAGNOSIS — B20 Human immunodeficiency virus [HIV] disease: Secondary | ICD-10-CM | POA: Diagnosis not present

## 2017-08-31 DIAGNOSIS — Z192 Hormone resistant malignancy status: Secondary | ICD-10-CM | POA: Diagnosis not present

## 2017-08-31 DIAGNOSIS — C61 Malignant neoplasm of prostate: Secondary | ICD-10-CM | POA: Diagnosis not present

## 2017-08-31 DIAGNOSIS — C7951 Secondary malignant neoplasm of bone: Secondary | ICD-10-CM | POA: Diagnosis not present

## 2017-09-02 DIAGNOSIS — Z192 Hormone resistant malignancy status: Secondary | ICD-10-CM | POA: Diagnosis not present

## 2017-09-02 DIAGNOSIS — B20 Human immunodeficiency virus [HIV] disease: Secondary | ICD-10-CM | POA: Diagnosis not present

## 2017-09-02 DIAGNOSIS — C7951 Secondary malignant neoplasm of bone: Secondary | ICD-10-CM | POA: Diagnosis not present

## 2017-09-02 DIAGNOSIS — C61 Malignant neoplasm of prostate: Secondary | ICD-10-CM | POA: Diagnosis not present

## 2017-09-04 ENCOUNTER — Encounter: Payer: Self-pay | Admitting: Radiation Oncology

## 2017-09-04 DIAGNOSIS — Z192 Hormone resistant malignancy status: Secondary | ICD-10-CM | POA: Diagnosis not present

## 2017-09-04 DIAGNOSIS — B20 Human immunodeficiency virus [HIV] disease: Secondary | ICD-10-CM | POA: Diagnosis not present

## 2017-09-04 DIAGNOSIS — C7951 Secondary malignant neoplasm of bone: Secondary | ICD-10-CM | POA: Diagnosis not present

## 2017-09-04 DIAGNOSIS — C61 Malignant neoplasm of prostate: Secondary | ICD-10-CM | POA: Diagnosis not present

## 2017-09-04 NOTE — Progress Notes (Signed)
  Radiation Oncology         [phone] ________________________________  Name: Dean Carlson MRN: [id number]  Date: 09/04/2017  DOB: [birth year]  End of Treatment Note  Diagnosis:   57 y.o. with advanced castrate resistance metastatic prostate carcinoma with painful metastasis to the right forearm.     Indication for treatment:  Palliative        Radiation treatment dates:   08/12/2017 - 08/19/2017  Site/dose:   The right forearm lesion was treated to 12 Gy of 30 Gy delivered in 4 of10 planned fractions of 3 Gy  Beams/energy:   Isodose Plan/ 6X  Narrative: The patient tolerated radiation treatment relatively well but elected to transition to hospice care prior to completion of treatments. During treatment, he endorsed pain to his right forearm, skull, lower back and right hip. He also experienced fatigue. He denied any swelling or skin changes.   Plan: The patient has elected to discontinue all cancer related treatment at this time and transition to hospice care.  We would be happy to continue to participate in his care if clinically indicated in the future but otherwise will see him on an as needed basis.  I advised him to call if he has any questions or concerns related to his recovery or treatment. ________________________________  Sheral Apley. Tammi Klippel, M.D.   This document serves as a record of services personally performed by Tyler Pita, MD. It was created on his behalf by Margit Banda, a trained medical scribe. The creation of this record is based on the scribe's personal observations and the provider's statements to them. This document has been checked and approved by the attending provider.

## 2017-09-07 DIAGNOSIS — B20 Human immunodeficiency virus [HIV] disease: Secondary | ICD-10-CM | POA: Diagnosis not present

## 2017-09-07 DIAGNOSIS — Z192 Hormone resistant malignancy status: Secondary | ICD-10-CM | POA: Diagnosis not present

## 2017-09-07 DIAGNOSIS — C7951 Secondary malignant neoplasm of bone: Secondary | ICD-10-CM | POA: Diagnosis not present

## 2017-09-07 DIAGNOSIS — C61 Malignant neoplasm of prostate: Secondary | ICD-10-CM | POA: Diagnosis not present

## 2017-09-08 ENCOUNTER — Telehealth: Payer: Self-pay

## 2017-09-08 DIAGNOSIS — Z192 Hormone resistant malignancy status: Secondary | ICD-10-CM | POA: Diagnosis not present

## 2017-09-08 DIAGNOSIS — B20 Human immunodeficiency virus [HIV] disease: Secondary | ICD-10-CM | POA: Diagnosis not present

## 2017-09-08 DIAGNOSIS — C61 Malignant neoplasm of prostate: Secondary | ICD-10-CM | POA: Diagnosis not present

## 2017-09-08 DIAGNOSIS — C7951 Secondary malignant neoplasm of bone: Secondary | ICD-10-CM | POA: Diagnosis not present

## 2017-09-08 NOTE — Telephone Encounter (Signed)
Received VM from pt regarding difficulty being able to care for self. Pt currently staying with sister, who is not able to assist him to the bathroom, which is getting increasingly difficult for the pt. He requested that our office recommend a facility if able to have the pt placed. Hospice currently coming to pt sister's home to help with care. Called pt who verbalized that Hospice will be coming to pick him up this afternoon to complete an evaluation and determine if pt to be placed at a facility. Pt requested to know if Dr. Alen Blew would remain his provider. Discussed with MD who confirmed that the pt would remain under his care at this time. Pt made aware of physician response and verbalized thanks for the f/u.

## 2017-09-09 DIAGNOSIS — Z192 Hormone resistant malignancy status: Secondary | ICD-10-CM | POA: Diagnosis not present

## 2017-09-09 DIAGNOSIS — B20 Human immunodeficiency virus [HIV] disease: Secondary | ICD-10-CM | POA: Diagnosis not present

## 2017-09-09 DIAGNOSIS — C7951 Secondary malignant neoplasm of bone: Secondary | ICD-10-CM | POA: Diagnosis not present

## 2017-09-09 DIAGNOSIS — C61 Malignant neoplasm of prostate: Secondary | ICD-10-CM | POA: Diagnosis not present

## 2017-09-10 DIAGNOSIS — C7951 Secondary malignant neoplasm of bone: Secondary | ICD-10-CM | POA: Diagnosis not present

## 2017-09-10 DIAGNOSIS — B20 Human immunodeficiency virus [HIV] disease: Secondary | ICD-10-CM | POA: Diagnosis not present

## 2017-09-10 DIAGNOSIS — Z192 Hormone resistant malignancy status: Secondary | ICD-10-CM | POA: Diagnosis not present

## 2017-09-10 DIAGNOSIS — C61 Malignant neoplasm of prostate: Secondary | ICD-10-CM | POA: Diagnosis not present

## 2017-09-10 MED FILL — BIKTARVY 50-200-25 MG TABS: 50-200-25 | 30 days supply | Qty: 30 | Fill #5

## 2017-09-11 DIAGNOSIS — C7951 Secondary malignant neoplasm of bone: Secondary | ICD-10-CM | POA: Diagnosis not present

## 2017-09-11 DIAGNOSIS — Z192 Hormone resistant malignancy status: Secondary | ICD-10-CM | POA: Diagnosis not present

## 2017-09-11 DIAGNOSIS — C61 Malignant neoplasm of prostate: Secondary | ICD-10-CM | POA: Diagnosis not present

## 2017-09-11 DIAGNOSIS — B20 Human immunodeficiency virus [HIV] disease: Secondary | ICD-10-CM | POA: Diagnosis not present

## 2017-09-12 DIAGNOSIS — C7951 Secondary malignant neoplasm of bone: Secondary | ICD-10-CM | POA: Diagnosis not present

## 2017-09-12 DIAGNOSIS — C61 Malignant neoplasm of prostate: Secondary | ICD-10-CM | POA: Diagnosis not present

## 2017-09-12 DIAGNOSIS — B20 Human immunodeficiency virus [HIV] disease: Secondary | ICD-10-CM | POA: Diagnosis not present

## 2017-09-12 DIAGNOSIS — Z192 Hormone resistant malignancy status: Secondary | ICD-10-CM | POA: Diagnosis not present

## 2017-09-13 DIAGNOSIS — Z192 Hormone resistant malignancy status: Secondary | ICD-10-CM | POA: Diagnosis not present

## 2017-09-13 DIAGNOSIS — B20 Human immunodeficiency virus [HIV] disease: Secondary | ICD-10-CM | POA: Diagnosis not present

## 2017-09-13 DIAGNOSIS — C7951 Secondary malignant neoplasm of bone: Secondary | ICD-10-CM | POA: Diagnosis not present

## 2017-09-13 DIAGNOSIS — C61 Malignant neoplasm of prostate: Secondary | ICD-10-CM | POA: Diagnosis not present

## 2017-09-14 DIAGNOSIS — B20 Human immunodeficiency virus [HIV] disease: Secondary | ICD-10-CM | POA: Diagnosis not present

## 2017-09-14 DIAGNOSIS — Z192 Hormone resistant malignancy status: Secondary | ICD-10-CM | POA: Diagnosis not present

## 2017-09-14 DIAGNOSIS — C61 Malignant neoplasm of prostate: Secondary | ICD-10-CM | POA: Diagnosis not present

## 2017-09-14 DIAGNOSIS — C7951 Secondary malignant neoplasm of bone: Secondary | ICD-10-CM | POA: Diagnosis not present

## 2017-09-15 DIAGNOSIS — B20 Human immunodeficiency virus [HIV] disease: Secondary | ICD-10-CM | POA: Diagnosis not present

## 2017-09-15 DIAGNOSIS — C7951 Secondary malignant neoplasm of bone: Secondary | ICD-10-CM | POA: Diagnosis not present

## 2017-09-15 DIAGNOSIS — Z192 Hormone resistant malignancy status: Secondary | ICD-10-CM | POA: Diagnosis not present

## 2017-09-15 DIAGNOSIS — C61 Malignant neoplasm of prostate: Secondary | ICD-10-CM | POA: Diagnosis not present

## 2017-09-16 DIAGNOSIS — Z192 Hormone resistant malignancy status: Secondary | ICD-10-CM | POA: Diagnosis not present

## 2017-09-16 DIAGNOSIS — B20 Human immunodeficiency virus [HIV] disease: Secondary | ICD-10-CM | POA: Diagnosis not present

## 2017-09-16 DIAGNOSIS — C7951 Secondary malignant neoplasm of bone: Secondary | ICD-10-CM | POA: Diagnosis not present

## 2017-09-16 DIAGNOSIS — C61 Malignant neoplasm of prostate: Secondary | ICD-10-CM | POA: Diagnosis not present

## 2017-09-17 DIAGNOSIS — B20 Human immunodeficiency virus [HIV] disease: Secondary | ICD-10-CM | POA: Diagnosis not present

## 2017-09-17 DIAGNOSIS — C7951 Secondary malignant neoplasm of bone: Secondary | ICD-10-CM | POA: Diagnosis not present

## 2017-09-17 DIAGNOSIS — Z192 Hormone resistant malignancy status: Secondary | ICD-10-CM | POA: Diagnosis not present

## 2017-09-17 DIAGNOSIS — C61 Malignant neoplasm of prostate: Secondary | ICD-10-CM | POA: Diagnosis not present

## 2017-09-18 DIAGNOSIS — B20 Human immunodeficiency virus [HIV] disease: Secondary | ICD-10-CM | POA: Diagnosis not present

## 2017-09-18 DIAGNOSIS — C7951 Secondary malignant neoplasm of bone: Secondary | ICD-10-CM | POA: Diagnosis not present

## 2017-09-18 DIAGNOSIS — Z192 Hormone resistant malignancy status: Secondary | ICD-10-CM | POA: Diagnosis not present

## 2017-09-18 DIAGNOSIS — C61 Malignant neoplasm of prostate: Secondary | ICD-10-CM | POA: Diagnosis not present

## 2017-09-19 DIAGNOSIS — B20 Human immunodeficiency virus [HIV] disease: Secondary | ICD-10-CM | POA: Diagnosis not present

## 2017-09-19 DIAGNOSIS — C7951 Secondary malignant neoplasm of bone: Secondary | ICD-10-CM | POA: Diagnosis not present

## 2017-09-19 DIAGNOSIS — Z192 Hormone resistant malignancy status: Secondary | ICD-10-CM | POA: Diagnosis not present

## 2017-09-19 DIAGNOSIS — C61 Malignant neoplasm of prostate: Secondary | ICD-10-CM | POA: Diagnosis not present

## 2017-09-20 DIAGNOSIS — C7951 Secondary malignant neoplasm of bone: Secondary | ICD-10-CM | POA: Diagnosis not present

## 2017-09-20 DIAGNOSIS — C61 Malignant neoplasm of prostate: Secondary | ICD-10-CM | POA: Diagnosis not present

## 2017-09-20 DIAGNOSIS — Z192 Hormone resistant malignancy status: Secondary | ICD-10-CM | POA: Diagnosis not present

## 2017-09-20 DIAGNOSIS — B20 Human immunodeficiency virus [HIV] disease: Secondary | ICD-10-CM | POA: Diagnosis not present

## 2017-09-21 DIAGNOSIS — Z192 Hormone resistant malignancy status: Secondary | ICD-10-CM | POA: Diagnosis not present

## 2017-09-21 DIAGNOSIS — C61 Malignant neoplasm of prostate: Secondary | ICD-10-CM | POA: Diagnosis not present

## 2017-09-21 DIAGNOSIS — C7951 Secondary malignant neoplasm of bone: Secondary | ICD-10-CM | POA: Diagnosis not present

## 2017-09-21 DIAGNOSIS — B20 Human immunodeficiency virus [HIV] disease: Secondary | ICD-10-CM | POA: Diagnosis not present

## 2017-09-22 DIAGNOSIS — C7951 Secondary malignant neoplasm of bone: Secondary | ICD-10-CM | POA: Diagnosis not present

## 2017-09-22 DIAGNOSIS — Z192 Hormone resistant malignancy status: Secondary | ICD-10-CM | POA: Diagnosis not present

## 2017-09-22 DIAGNOSIS — C61 Malignant neoplasm of prostate: Secondary | ICD-10-CM | POA: Diagnosis not present

## 2017-09-22 DIAGNOSIS — B20 Human immunodeficiency virus [HIV] disease: Secondary | ICD-10-CM | POA: Diagnosis not present

## 2017-09-23 DIAGNOSIS — C61 Malignant neoplasm of prostate: Secondary | ICD-10-CM | POA: Diagnosis not present

## 2017-09-23 DIAGNOSIS — C7951 Secondary malignant neoplasm of bone: Secondary | ICD-10-CM | POA: Diagnosis not present

## 2017-09-23 DIAGNOSIS — B20 Human immunodeficiency virus [HIV] disease: Secondary | ICD-10-CM | POA: Diagnosis not present

## 2017-09-23 DIAGNOSIS — Z192 Hormone resistant malignancy status: Secondary | ICD-10-CM | POA: Diagnosis not present

## 2017-09-25 DIAGNOSIS — C61 Malignant neoplasm of prostate: Secondary | ICD-10-CM | POA: Diagnosis not present

## 2017-09-25 DIAGNOSIS — C7951 Secondary malignant neoplasm of bone: Secondary | ICD-10-CM | POA: Diagnosis not present

## 2017-09-25 DIAGNOSIS — Z192 Hormone resistant malignancy status: Secondary | ICD-10-CM | POA: Diagnosis not present

## 2017-09-25 DIAGNOSIS — B20 Human immunodeficiency virus [HIV] disease: Secondary | ICD-10-CM | POA: Diagnosis not present

## 2017-09-28 DIAGNOSIS — B20 Human immunodeficiency virus [HIV] disease: Secondary | ICD-10-CM | POA: Diagnosis not present

## 2017-09-28 DIAGNOSIS — Z192 Hormone resistant malignancy status: Secondary | ICD-10-CM | POA: Diagnosis not present

## 2017-09-28 DIAGNOSIS — C7951 Secondary malignant neoplasm of bone: Secondary | ICD-10-CM | POA: Diagnosis not present

## 2017-09-28 DIAGNOSIS — C61 Malignant neoplasm of prostate: Secondary | ICD-10-CM | POA: Diagnosis not present

## 2017-09-29 DIAGNOSIS — Z192 Hormone resistant malignancy status: Secondary | ICD-10-CM | POA: Diagnosis not present

## 2017-09-29 DIAGNOSIS — B20 Human immunodeficiency virus [HIV] disease: Secondary | ICD-10-CM | POA: Diagnosis not present

## 2017-09-29 DIAGNOSIS — C7951 Secondary malignant neoplasm of bone: Secondary | ICD-10-CM | POA: Diagnosis not present

## 2017-09-29 DIAGNOSIS — C61 Malignant neoplasm of prostate: Secondary | ICD-10-CM | POA: Diagnosis not present

## 2017-09-30 DIAGNOSIS — C61 Malignant neoplasm of prostate: Secondary | ICD-10-CM | POA: Diagnosis not present

## 2017-09-30 DIAGNOSIS — C7951 Secondary malignant neoplasm of bone: Secondary | ICD-10-CM | POA: Diagnosis not present

## 2017-09-30 DIAGNOSIS — B20 Human immunodeficiency virus [HIV] disease: Secondary | ICD-10-CM | POA: Diagnosis not present

## 2017-09-30 DIAGNOSIS — Z192 Hormone resistant malignancy status: Secondary | ICD-10-CM | POA: Diagnosis not present

## 2017-10-01 DIAGNOSIS — B20 Human immunodeficiency virus [HIV] disease: Secondary | ICD-10-CM | POA: Diagnosis not present

## 2017-10-01 DIAGNOSIS — C61 Malignant neoplasm of prostate: Secondary | ICD-10-CM | POA: Diagnosis not present

## 2017-10-01 DIAGNOSIS — C7951 Secondary malignant neoplasm of bone: Secondary | ICD-10-CM | POA: Diagnosis not present

## 2017-10-01 DIAGNOSIS — Z192 Hormone resistant malignancy status: Secondary | ICD-10-CM | POA: Diagnosis not present

## 2017-10-02 DIAGNOSIS — B20 Human immunodeficiency virus [HIV] disease: Secondary | ICD-10-CM | POA: Diagnosis not present

## 2017-10-02 DIAGNOSIS — Z192 Hormone resistant malignancy status: Secondary | ICD-10-CM | POA: Diagnosis not present

## 2017-10-02 DIAGNOSIS — C61 Malignant neoplasm of prostate: Secondary | ICD-10-CM | POA: Diagnosis not present

## 2017-10-02 DIAGNOSIS — C7951 Secondary malignant neoplasm of bone: Secondary | ICD-10-CM | POA: Diagnosis not present

## 2017-10-05 DIAGNOSIS — C61 Malignant neoplasm of prostate: Secondary | ICD-10-CM | POA: Diagnosis not present

## 2017-10-05 DIAGNOSIS — Z192 Hormone resistant malignancy status: Secondary | ICD-10-CM | POA: Diagnosis not present

## 2017-10-05 DIAGNOSIS — B20 Human immunodeficiency virus [HIV] disease: Secondary | ICD-10-CM | POA: Diagnosis not present

## 2017-10-05 DIAGNOSIS — C7951 Secondary malignant neoplasm of bone: Secondary | ICD-10-CM | POA: Diagnosis not present

## 2017-10-06 DIAGNOSIS — Z192 Hormone resistant malignancy status: Secondary | ICD-10-CM | POA: Diagnosis not present

## 2017-10-06 DIAGNOSIS — C61 Malignant neoplasm of prostate: Secondary | ICD-10-CM | POA: Diagnosis not present

## 2017-10-06 DIAGNOSIS — B20 Human immunodeficiency virus [HIV] disease: Secondary | ICD-10-CM | POA: Diagnosis not present

## 2017-10-06 DIAGNOSIS — C7951 Secondary malignant neoplasm of bone: Secondary | ICD-10-CM | POA: Diagnosis not present

## 2017-10-07 DIAGNOSIS — B20 Human immunodeficiency virus [HIV] disease: Secondary | ICD-10-CM | POA: Diagnosis not present

## 2017-10-07 DIAGNOSIS — Z192 Hormone resistant malignancy status: Secondary | ICD-10-CM | POA: Diagnosis not present

## 2017-10-07 DIAGNOSIS — C7951 Secondary malignant neoplasm of bone: Secondary | ICD-10-CM | POA: Diagnosis not present

## 2017-10-07 DIAGNOSIS — C61 Malignant neoplasm of prostate: Secondary | ICD-10-CM | POA: Diagnosis not present

## 2017-10-08 DIAGNOSIS — C7951 Secondary malignant neoplasm of bone: Secondary | ICD-10-CM | POA: Diagnosis not present

## 2017-10-08 DIAGNOSIS — B20 Human immunodeficiency virus [HIV] disease: Secondary | ICD-10-CM | POA: Diagnosis not present

## 2017-10-08 DIAGNOSIS — C61 Malignant neoplasm of prostate: Secondary | ICD-10-CM | POA: Diagnosis not present

## 2017-10-09 DIAGNOSIS — Z192 Hormone resistant malignancy status: Secondary | ICD-10-CM | POA: Diagnosis not present

## 2017-10-09 DIAGNOSIS — B20 Human immunodeficiency virus [HIV] disease: Secondary | ICD-10-CM | POA: Diagnosis not present

## 2017-10-09 DIAGNOSIS — C61 Malignant neoplasm of prostate: Secondary | ICD-10-CM | POA: Diagnosis not present

## 2017-10-09 DIAGNOSIS — C7951 Secondary malignant neoplasm of bone: Secondary | ICD-10-CM | POA: Diagnosis not present

## 2017-10-12 DIAGNOSIS — C61 Malignant neoplasm of prostate: Secondary | ICD-10-CM | POA: Diagnosis not present

## 2017-10-12 DIAGNOSIS — C7951 Secondary malignant neoplasm of bone: Secondary | ICD-10-CM | POA: Diagnosis not present

## 2017-10-12 DIAGNOSIS — Z192 Hormone resistant malignancy status: Secondary | ICD-10-CM | POA: Diagnosis not present

## 2017-10-12 DIAGNOSIS — B20 Human immunodeficiency virus [HIV] disease: Secondary | ICD-10-CM | POA: Diagnosis not present

## 2017-10-13 DIAGNOSIS — Z192 Hormone resistant malignancy status: Secondary | ICD-10-CM | POA: Diagnosis not present

## 2017-10-13 DIAGNOSIS — B20 Human immunodeficiency virus [HIV] disease: Secondary | ICD-10-CM | POA: Diagnosis not present

## 2017-10-13 DIAGNOSIS — C7951 Secondary malignant neoplasm of bone: Secondary | ICD-10-CM | POA: Diagnosis not present

## 2017-10-13 DIAGNOSIS — C61 Malignant neoplasm of prostate: Secondary | ICD-10-CM | POA: Diagnosis not present

## 2017-10-14 DIAGNOSIS — C61 Malignant neoplasm of prostate: Secondary | ICD-10-CM | POA: Diagnosis not present

## 2017-10-14 DIAGNOSIS — C7951 Secondary malignant neoplasm of bone: Secondary | ICD-10-CM | POA: Diagnosis not present

## 2017-10-14 DIAGNOSIS — B20 Human immunodeficiency virus [HIV] disease: Secondary | ICD-10-CM | POA: Diagnosis not present

## 2017-10-14 DIAGNOSIS — Z192 Hormone resistant malignancy status: Secondary | ICD-10-CM | POA: Diagnosis not present

## 2017-10-15 MED FILL — BIKTARVY 50-200-25 MG TABS: 50-200-25 | 30 days supply | Qty: 30 | Fill #6

## 2017-10-16 DIAGNOSIS — C7951 Secondary malignant neoplasm of bone: Secondary | ICD-10-CM | POA: Diagnosis not present

## 2017-10-16 DIAGNOSIS — C61 Malignant neoplasm of prostate: Secondary | ICD-10-CM | POA: Diagnosis not present

## 2017-10-16 DIAGNOSIS — Z192 Hormone resistant malignancy status: Secondary | ICD-10-CM | POA: Diagnosis not present

## 2017-10-16 DIAGNOSIS — B20 Human immunodeficiency virus [HIV] disease: Secondary | ICD-10-CM | POA: Diagnosis not present

## 2017-10-19 DIAGNOSIS — Z192 Hormone resistant malignancy status: Secondary | ICD-10-CM | POA: Diagnosis not present

## 2017-10-19 DIAGNOSIS — C7951 Secondary malignant neoplasm of bone: Secondary | ICD-10-CM | POA: Diagnosis not present

## 2017-10-19 DIAGNOSIS — B20 Human immunodeficiency virus [HIV] disease: Secondary | ICD-10-CM | POA: Diagnosis not present

## 2017-10-19 DIAGNOSIS — C61 Malignant neoplasm of prostate: Secondary | ICD-10-CM | POA: Diagnosis not present

## 2017-10-20 DIAGNOSIS — Z192 Hormone resistant malignancy status: Secondary | ICD-10-CM | POA: Diagnosis not present

## 2017-10-20 DIAGNOSIS — B20 Human immunodeficiency virus [HIV] disease: Secondary | ICD-10-CM | POA: Diagnosis not present

## 2017-10-20 DIAGNOSIS — C7951 Secondary malignant neoplasm of bone: Secondary | ICD-10-CM | POA: Diagnosis not present

## 2017-10-20 DIAGNOSIS — C61 Malignant neoplasm of prostate: Secondary | ICD-10-CM | POA: Diagnosis not present

## 2017-10-21 DIAGNOSIS — C61 Malignant neoplasm of prostate: Secondary | ICD-10-CM | POA: Diagnosis not present

## 2017-10-21 DIAGNOSIS — B20 Human immunodeficiency virus [HIV] disease: Secondary | ICD-10-CM | POA: Diagnosis not present

## 2017-10-21 DIAGNOSIS — C7951 Secondary malignant neoplasm of bone: Secondary | ICD-10-CM | POA: Diagnosis not present

## 2017-10-21 DIAGNOSIS — Z192 Hormone resistant malignancy status: Secondary | ICD-10-CM | POA: Diagnosis not present

## 2017-10-23 DIAGNOSIS — C61 Malignant neoplasm of prostate: Secondary | ICD-10-CM | POA: Diagnosis not present

## 2017-10-23 DIAGNOSIS — B20 Human immunodeficiency virus [HIV] disease: Secondary | ICD-10-CM | POA: Diagnosis not present

## 2017-10-23 DIAGNOSIS — Z192 Hormone resistant malignancy status: Secondary | ICD-10-CM | POA: Diagnosis not present

## 2017-10-23 DIAGNOSIS — C7951 Secondary malignant neoplasm of bone: Secondary | ICD-10-CM | POA: Diagnosis not present

## 2017-10-26 DIAGNOSIS — C61 Malignant neoplasm of prostate: Secondary | ICD-10-CM | POA: Diagnosis not present

## 2017-10-26 DIAGNOSIS — C7951 Secondary malignant neoplasm of bone: Secondary | ICD-10-CM | POA: Diagnosis not present

## 2017-10-26 DIAGNOSIS — Z192 Hormone resistant malignancy status: Secondary | ICD-10-CM | POA: Diagnosis not present

## 2017-10-26 DIAGNOSIS — B20 Human immunodeficiency virus [HIV] disease: Secondary | ICD-10-CM | POA: Diagnosis not present

## 2017-10-27 DIAGNOSIS — Z192 Hormone resistant malignancy status: Secondary | ICD-10-CM | POA: Diagnosis not present

## 2017-10-27 DIAGNOSIS — C7951 Secondary malignant neoplasm of bone: Secondary | ICD-10-CM | POA: Diagnosis not present

## 2017-10-27 DIAGNOSIS — C61 Malignant neoplasm of prostate: Secondary | ICD-10-CM | POA: Diagnosis not present

## 2017-10-27 DIAGNOSIS — B20 Human immunodeficiency virus [HIV] disease: Secondary | ICD-10-CM | POA: Diagnosis not present

## 2017-10-28 DIAGNOSIS — C7951 Secondary malignant neoplasm of bone: Secondary | ICD-10-CM | POA: Diagnosis not present

## 2017-10-28 DIAGNOSIS — B20 Human immunodeficiency virus [HIV] disease: Secondary | ICD-10-CM | POA: Diagnosis not present

## 2017-10-28 DIAGNOSIS — Z192 Hormone resistant malignancy status: Secondary | ICD-10-CM | POA: Diagnosis not present

## 2017-10-28 DIAGNOSIS — C61 Malignant neoplasm of prostate: Secondary | ICD-10-CM | POA: Diagnosis not present

## 2017-10-29 DIAGNOSIS — C61 Malignant neoplasm of prostate: Secondary | ICD-10-CM | POA: Diagnosis not present

## 2017-10-29 DIAGNOSIS — F419 Anxiety disorder, unspecified: Secondary | ICD-10-CM | POA: Diagnosis not present

## 2017-10-29 DIAGNOSIS — R52 Pain, unspecified: Secondary | ICD-10-CM | POA: Diagnosis not present

## 2017-10-30 DIAGNOSIS — B20 Human immunodeficiency virus [HIV] disease: Secondary | ICD-10-CM | POA: Diagnosis not present

## 2017-10-30 DIAGNOSIS — C61 Malignant neoplasm of prostate: Secondary | ICD-10-CM | POA: Diagnosis not present

## 2017-10-30 DIAGNOSIS — C7951 Secondary malignant neoplasm of bone: Secondary | ICD-10-CM | POA: Diagnosis not present

## 2017-10-30 DIAGNOSIS — Z192 Hormone resistant malignancy status: Secondary | ICD-10-CM | POA: Diagnosis not present

## 2017-11-01 DIAGNOSIS — Z192 Hormone resistant malignancy status: Secondary | ICD-10-CM | POA: Diagnosis not present

## 2017-11-01 DIAGNOSIS — C7951 Secondary malignant neoplasm of bone: Secondary | ICD-10-CM | POA: Diagnosis not present

## 2017-11-01 DIAGNOSIS — B20 Human immunodeficiency virus [HIV] disease: Secondary | ICD-10-CM | POA: Diagnosis not present

## 2017-11-01 DIAGNOSIS — C61 Malignant neoplasm of prostate: Secondary | ICD-10-CM | POA: Diagnosis not present

## 2017-11-02 DIAGNOSIS — B20 Human immunodeficiency virus [HIV] disease: Secondary | ICD-10-CM | POA: Diagnosis not present

## 2017-11-02 DIAGNOSIS — C7951 Secondary malignant neoplasm of bone: Secondary | ICD-10-CM | POA: Diagnosis not present

## 2017-11-02 DIAGNOSIS — Z192 Hormone resistant malignancy status: Secondary | ICD-10-CM | POA: Diagnosis not present

## 2017-11-02 DIAGNOSIS — C61 Malignant neoplasm of prostate: Secondary | ICD-10-CM | POA: Diagnosis not present

## 2017-11-03 DIAGNOSIS — C7951 Secondary malignant neoplasm of bone: Secondary | ICD-10-CM | POA: Diagnosis not present

## 2017-11-03 DIAGNOSIS — C61 Malignant neoplasm of prostate: Secondary | ICD-10-CM | POA: Diagnosis not present

## 2017-11-03 DIAGNOSIS — Z192 Hormone resistant malignancy status: Secondary | ICD-10-CM | POA: Diagnosis not present

## 2017-11-03 DIAGNOSIS — B20 Human immunodeficiency virus [HIV] disease: Secondary | ICD-10-CM | POA: Diagnosis not present

## 2017-11-04 DIAGNOSIS — C7951 Secondary malignant neoplasm of bone: Secondary | ICD-10-CM | POA: Diagnosis not present

## 2017-11-04 DIAGNOSIS — C61 Malignant neoplasm of prostate: Secondary | ICD-10-CM | POA: Diagnosis not present

## 2017-11-04 DIAGNOSIS — Z192 Hormone resistant malignancy status: Secondary | ICD-10-CM | POA: Diagnosis not present

## 2017-11-04 DIAGNOSIS — B20 Human immunodeficiency virus [HIV] disease: Secondary | ICD-10-CM | POA: Diagnosis not present

## 2017-11-06 DIAGNOSIS — B20 Human immunodeficiency virus [HIV] disease: Secondary | ICD-10-CM | POA: Diagnosis not present

## 2017-11-06 DIAGNOSIS — C7951 Secondary malignant neoplasm of bone: Secondary | ICD-10-CM | POA: Diagnosis not present

## 2017-11-06 DIAGNOSIS — C61 Malignant neoplasm of prostate: Secondary | ICD-10-CM | POA: Diagnosis not present

## 2017-11-06 DIAGNOSIS — Z192 Hormone resistant malignancy status: Secondary | ICD-10-CM | POA: Diagnosis not present

## 2017-11-09 DIAGNOSIS — C7951 Secondary malignant neoplasm of bone: Secondary | ICD-10-CM | POA: Diagnosis not present

## 2017-11-09 DIAGNOSIS — Z192 Hormone resistant malignancy status: Secondary | ICD-10-CM | POA: Diagnosis not present

## 2017-11-09 DIAGNOSIS — C61 Malignant neoplasm of prostate: Secondary | ICD-10-CM | POA: Diagnosis not present

## 2017-11-09 DIAGNOSIS — B20 Human immunodeficiency virus [HIV] disease: Secondary | ICD-10-CM | POA: Diagnosis not present

## 2017-11-10 DIAGNOSIS — C7951 Secondary malignant neoplasm of bone: Secondary | ICD-10-CM | POA: Diagnosis not present

## 2017-11-10 DIAGNOSIS — B20 Human immunodeficiency virus [HIV] disease: Secondary | ICD-10-CM | POA: Diagnosis not present

## 2017-11-10 DIAGNOSIS — C61 Malignant neoplasm of prostate: Secondary | ICD-10-CM | POA: Diagnosis not present

## 2017-11-10 DIAGNOSIS — Z192 Hormone resistant malignancy status: Secondary | ICD-10-CM | POA: Diagnosis not present

## 2017-11-11 DIAGNOSIS — B20 Human immunodeficiency virus [HIV] disease: Secondary | ICD-10-CM | POA: Diagnosis not present

## 2017-11-11 DIAGNOSIS — C61 Malignant neoplasm of prostate: Secondary | ICD-10-CM | POA: Diagnosis not present

## 2017-11-11 DIAGNOSIS — Z192 Hormone resistant malignancy status: Secondary | ICD-10-CM | POA: Diagnosis not present

## 2017-11-11 DIAGNOSIS — C7951 Secondary malignant neoplasm of bone: Secondary | ICD-10-CM | POA: Diagnosis not present

## 2017-11-13 DIAGNOSIS — Z192 Hormone resistant malignancy status: Secondary | ICD-10-CM | POA: Diagnosis not present

## 2017-11-13 DIAGNOSIS — C7951 Secondary malignant neoplasm of bone: Secondary | ICD-10-CM | POA: Diagnosis not present

## 2017-11-13 DIAGNOSIS — C61 Malignant neoplasm of prostate: Secondary | ICD-10-CM | POA: Diagnosis not present

## 2017-11-13 DIAGNOSIS — B20 Human immunodeficiency virus [HIV] disease: Secondary | ICD-10-CM | POA: Diagnosis not present

## 2017-11-16 DIAGNOSIS — Z192 Hormone resistant malignancy status: Secondary | ICD-10-CM | POA: Diagnosis not present

## 2017-11-16 DIAGNOSIS — C61 Malignant neoplasm of prostate: Secondary | ICD-10-CM | POA: Diagnosis not present

## 2017-11-16 DIAGNOSIS — C7951 Secondary malignant neoplasm of bone: Secondary | ICD-10-CM | POA: Diagnosis not present

## 2017-11-16 DIAGNOSIS — B20 Human immunodeficiency virus [HIV] disease: Secondary | ICD-10-CM | POA: Diagnosis not present

## 2017-11-17 DIAGNOSIS — B20 Human immunodeficiency virus [HIV] disease: Secondary | ICD-10-CM | POA: Diagnosis not present

## 2017-11-17 DIAGNOSIS — Z192 Hormone resistant malignancy status: Secondary | ICD-10-CM | POA: Diagnosis not present

## 2017-11-17 DIAGNOSIS — C61 Malignant neoplasm of prostate: Secondary | ICD-10-CM | POA: Diagnosis not present

## 2017-11-17 DIAGNOSIS — C7951 Secondary malignant neoplasm of bone: Secondary | ICD-10-CM | POA: Diagnosis not present

## 2017-11-18 DIAGNOSIS — C7951 Secondary malignant neoplasm of bone: Secondary | ICD-10-CM | POA: Diagnosis not present

## 2017-11-18 DIAGNOSIS — Z192 Hormone resistant malignancy status: Secondary | ICD-10-CM | POA: Diagnosis not present

## 2017-11-18 DIAGNOSIS — R52 Pain, unspecified: Secondary | ICD-10-CM | POA: Diagnosis not present

## 2017-11-18 DIAGNOSIS — F329 Major depressive disorder, single episode, unspecified: Secondary | ICD-10-CM | POA: Diagnosis not present

## 2017-11-18 DIAGNOSIS — F419 Anxiety disorder, unspecified: Secondary | ICD-10-CM | POA: Diagnosis not present

## 2017-11-18 DIAGNOSIS — C61 Malignant neoplasm of prostate: Secondary | ICD-10-CM | POA: Diagnosis not present

## 2017-11-18 DIAGNOSIS — B20 Human immunodeficiency virus [HIV] disease: Secondary | ICD-10-CM | POA: Diagnosis not present

## 2017-11-20 DIAGNOSIS — Z192 Hormone resistant malignancy status: Secondary | ICD-10-CM | POA: Diagnosis not present

## 2017-11-20 DIAGNOSIS — B20 Human immunodeficiency virus [HIV] disease: Secondary | ICD-10-CM | POA: Diagnosis not present

## 2017-11-20 DIAGNOSIS — C61 Malignant neoplasm of prostate: Secondary | ICD-10-CM | POA: Diagnosis not present

## 2017-11-20 DIAGNOSIS — C7951 Secondary malignant neoplasm of bone: Secondary | ICD-10-CM | POA: Diagnosis not present

## 2017-11-23 DIAGNOSIS — B20 Human immunodeficiency virus [HIV] disease: Secondary | ICD-10-CM | POA: Diagnosis not present

## 2017-11-23 DIAGNOSIS — Z192 Hormone resistant malignancy status: Secondary | ICD-10-CM | POA: Diagnosis not present

## 2017-11-23 DIAGNOSIS — C7951 Secondary malignant neoplasm of bone: Secondary | ICD-10-CM | POA: Diagnosis not present

## 2017-11-23 DIAGNOSIS — C61 Malignant neoplasm of prostate: Secondary | ICD-10-CM | POA: Diagnosis not present

## 2017-11-24 DIAGNOSIS — C7951 Secondary malignant neoplasm of bone: Secondary | ICD-10-CM | POA: Diagnosis not present

## 2017-11-24 DIAGNOSIS — B20 Human immunodeficiency virus [HIV] disease: Secondary | ICD-10-CM | POA: Diagnosis not present

## 2017-11-24 DIAGNOSIS — C61 Malignant neoplasm of prostate: Secondary | ICD-10-CM | POA: Diagnosis not present

## 2017-11-24 DIAGNOSIS — Z192 Hormone resistant malignancy status: Secondary | ICD-10-CM | POA: Diagnosis not present

## 2017-11-25 ENCOUNTER — Other Ambulatory Visit: Payer: Self-pay | Admitting: Pharmacist

## 2017-11-25 ENCOUNTER — Other Ambulatory Visit: Payer: Self-pay | Admitting: Infectious Disease

## 2017-11-25 DIAGNOSIS — B20 Human immunodeficiency virus [HIV] disease: Secondary | ICD-10-CM

## 2017-11-25 DIAGNOSIS — C61 Malignant neoplasm of prostate: Secondary | ICD-10-CM | POA: Diagnosis not present

## 2017-11-25 DIAGNOSIS — Z192 Hormone resistant malignancy status: Secondary | ICD-10-CM | POA: Diagnosis not present

## 2017-11-25 DIAGNOSIS — C7951 Secondary malignant neoplasm of bone: Secondary | ICD-10-CM | POA: Diagnosis not present

## 2017-11-25 MED ORDER — BICTEGRAVIR-EMTRICITAB-TENOFOV 50-200-25 MG PO TABS: 1.0000 | ORAL_TABLET | Freq: Every day | ORAL | 6 refills | Status: AC

## 2017-11-25 MED FILL — BIKTARVY 50-200-25 MG TABS: 50-200-25 | 30 days supply | Qty: 30 | Fill #0

## 2017-11-26 DIAGNOSIS — C7951 Secondary malignant neoplasm of bone: Secondary | ICD-10-CM | POA: Diagnosis not present

## 2017-11-26 DIAGNOSIS — B20 Human immunodeficiency virus [HIV] disease: Secondary | ICD-10-CM | POA: Diagnosis not present

## 2017-11-26 DIAGNOSIS — C61 Malignant neoplasm of prostate: Secondary | ICD-10-CM | POA: Diagnosis not present

## 2017-11-26 DIAGNOSIS — Z192 Hormone resistant malignancy status: Secondary | ICD-10-CM | POA: Diagnosis not present

## 2017-11-27 DIAGNOSIS — C7951 Secondary malignant neoplasm of bone: Secondary | ICD-10-CM | POA: Diagnosis not present

## 2017-11-27 DIAGNOSIS — C61 Malignant neoplasm of prostate: Secondary | ICD-10-CM | POA: Diagnosis not present

## 2017-11-27 DIAGNOSIS — B20 Human immunodeficiency virus [HIV] disease: Secondary | ICD-10-CM | POA: Diagnosis not present

## 2017-11-27 DIAGNOSIS — Z192 Hormone resistant malignancy status: Secondary | ICD-10-CM | POA: Diagnosis not present

## 2017-11-30 DIAGNOSIS — C7951 Secondary malignant neoplasm of bone: Secondary | ICD-10-CM | POA: Diagnosis not present

## 2017-11-30 DIAGNOSIS — Z192 Hormone resistant malignancy status: Secondary | ICD-10-CM | POA: Diagnosis not present

## 2017-11-30 DIAGNOSIS — C61 Malignant neoplasm of prostate: Secondary | ICD-10-CM | POA: Diagnosis not present

## 2017-11-30 DIAGNOSIS — B20 Human immunodeficiency virus [HIV] disease: Secondary | ICD-10-CM | POA: Diagnosis not present

## 2017-12-09 DIAGNOSIS — C7951 Secondary malignant neoplasm of bone: Secondary | ICD-10-CM | POA: Diagnosis not present

## 2017-12-09 DIAGNOSIS — C61 Malignant neoplasm of prostate: Secondary | ICD-10-CM | POA: Diagnosis not present

## 2017-12-09 DIAGNOSIS — B20 Human immunodeficiency virus [HIV] disease: Secondary | ICD-10-CM | POA: Diagnosis not present

## 2018-01-07 ENCOUNTER — Telehealth: Payer: Self-pay | Admitting: *Deleted

## 2018-01-07 NOTE — Telephone Encounter (Signed)
Nurse from [facility] [place] [facility] calling. Patient expired Jan 07, 2018. The family wanted dr Alen Blew to know that they thought very highly  of him and appreciated his care. All appts removed from epic

## 2018-01-31 DEATH — deceased

## 2019-06-11 IMAGING — US IR FLUORO GUIDE CV LINE*R*
1 series · 1 of 1 positions shown · non-contrast
Comparison: None.

INDICATION: History of prostate cancer. In need of durable intravenous access
for chemotherapy administration.

EXAM:
IMPLANTED PORT A CATH PLACEMENT WITH ULTRASOUND AND FLUOROSCOPIC
GUIDANCE

[Series 1: ir fluoro/shunt/fist · 1 of 1 slices shown]
[im 1/1]
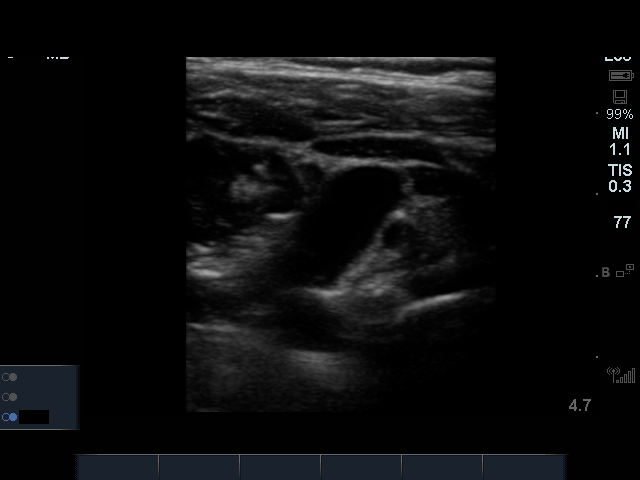

[1 of 1 positions shown; findings below may reference images not displayed]

MEDICATIONS:
Ancef 2 gm IV; The antibiotic was administered within an appropriate
time interval prior to skin puncture.

ANESTHESIA/SEDATION:
Moderate (conscious) sedation was employed during this procedure. A
total of Versed 3 mg and Fentanyl 100 mcg was administered
intravenously.

Moderate Sedation Time: 24 minutes. The patient's level of
consciousness and vital signs were monitored continuously by
radiology nursing throughout the procedure under my direct
supervision.

CONTRAST:  None

FLUOROSCOPY TIME:  30 seconds (8 mGy)

COMPLICATIONS:
None immediate.

PROCEDURE:
The procedure, risks, benefits, and alternatives were explained to
the patient. Questions regarding the procedure were encouraged and
answered. The patient understands and consents to the procedure.

The right neck and chest were prepped with chlorhexidine in a
sterile fashion, and a sterile drape was applied covering the
operative field. Maximum barrier sterile technique with sterile
gowns and gloves were used for the procedure. A timeout was
performed prior to the initiation of the procedure. Local anesthesia
was provided with 1% lidocaine with epinephrine.

After creating a small venotomy incision, a micropuncture kit was
utilized to access the internal jugular vein. Real-time ultrasound
guidance was utilized for vascular access including the acquisition
of a permanent ultrasound image documenting patency of the accessed
vessel. The microwire was utilized to measure appropriate catheter
length.

A subcutaneous port pocket was then created along the upper chest
wall utilizing a combination of sharp and blunt dissection. The
pocket was irrigated with sterile saline. A single lumen ISP power
injectable port was chosen for placement. The 8 Fr catheter was
tunneled from the port pocket site to the venotomy incision. The
port was placed in the pocket. The external catheter was trimmed to
appropriate length. At the venotomy, an 8 Fr peel-away sheath was
placed over a guidewire under fluoroscopic guidance. The catheter
was then placed through the sheath and the sheath was removed. Final
catheter positioning was confirmed and documented with a
fluoroscopic spot radiograph. The port was accessed with Vimal Mundt
needle, aspirated and flushed with heparinized saline.

The venotomy site was closed with an interrupted 4-0 Vicryl suture.
The port pocket incision was closed with interrupted 2-0 Vicryl
suture and the skin was opposed with a running subcuticular 4-0
Vicryl suture. Dermabond and Stacey were applied to both
incisions. Dressings were placed. The patient tolerated the
procedure well without immediate post procedural complication.
FINDINGS: After catheter placement, the tip lies within the superior
cavoatrial junction. The catheter aspirates and flushes normally and
is ready for immediate use.
IMPRESSION: Successful placement of a right internal jugular approach power
injectable Port-A-Cath. The catheter is ready for immediate use.

## 2019-12-05 IMAGING — NM NM BONE WHOLE BODY
2 series · 2 of 2 positions shown · non-contrast
Comparison: 12/05/2016

Correlation: RIGHT forearm radiographs of 06/09/2017

CLINICAL DATA: Prostate cancer, pain at posterior skull, both arms,
RIGHT hip/pelvis radiating to shin, prior RIGHT arm fracture,
assessed by response to therapy

EXAM:
NUCLEAR MEDICINE WHOLE BODY BONE SCAN
TECHNIQUE: Whole body anterior and posterior images were obtained approximately
3 hours after intravenous injection of radiopharmaceutical.
RADIOPHARMACEUTICALS:  20.8 mCi Jechnetium-ZZm MDP IV

[Series 1: wbr_bone_40 whole body · 2.66mm/px · 1 of 1 slices shown (1 of 2)]
[im 1/1]
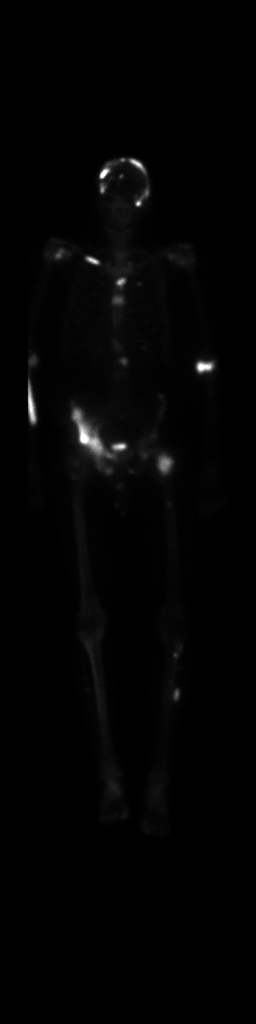

[Series 1: wbr_bone_40 whole body · 2.66mm/px · 1 of 1 slices shown (2 of 2)]
[im 1/1]
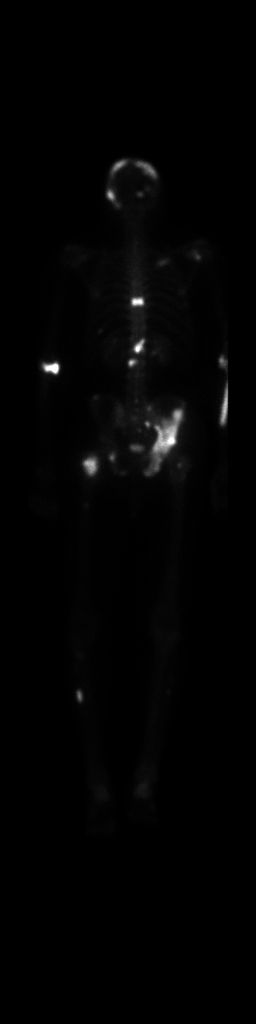

[2 of 2 positions shown; findings below may reference images not displayed]

FINDINGS: Numerous sites of abnormal osseous tracer accumulation are
identified consistent with widespread osseous metastatic disease.

These include sites within the calvarium, RIGHT clavicle, sternum,
ribs, thoracic/lumbar spine, pelvis, and proximal femora.

Sites at ribs and in pelvis are new since previous exam as is a
focus at the intertrochanteric region RIGHT femur.

Foci of new increased tracer localization are seen at the proximal
LEFT tibial metaphysis and at the RIGHT fibula question metastases

Persistent focus of abnormal increased tracer localization at the
mid LEFT fibula.

Linear increased uptake at the RIGHT forearm corresponds to healing
fracture on prior radiographs.
IMPRESSION: Progressive osseous metastatic disease as above.

## 2022-01-13 ENCOUNTER — Ambulatory Visit: Payer: Self-pay | Admitting: Radiation Oncology
